# Patient Record
Sex: Female | Born: 1945 | Race: Black or African American | Hispanic: No | Smoking: Former smoker
Health system: Southern US, Community
[De-identification: ages and names within clinical notes are randomized; demographics above are authoritative.]

## PROBLEM LIST (undated history)

## (undated) ENCOUNTER — Emergency Department (HOSPITAL_COMMUNITY): Discharge status: Against Medical Advice | Payer: Medicare Other

## (undated) DIAGNOSIS — Z889 Allergy status to unspecified drugs, medicaments and biological substances status: Secondary | ICD-10-CM

## (undated) DIAGNOSIS — I10 Essential (primary) hypertension: Secondary | ICD-10-CM

## (undated) DIAGNOSIS — F419 Anxiety disorder, unspecified: Secondary | ICD-10-CM

## (undated) DIAGNOSIS — J189 Pneumonia, unspecified organism: Secondary | ICD-10-CM

## (undated) DIAGNOSIS — E785 Hyperlipidemia, unspecified: Secondary | ICD-10-CM

## (undated) DIAGNOSIS — M1990 Unspecified osteoarthritis, unspecified site: Secondary | ICD-10-CM

## (undated) DIAGNOSIS — E119 Type 2 diabetes mellitus without complications: Secondary | ICD-10-CM

## (undated) DIAGNOSIS — Z8719 Personal history of other diseases of the digestive system: Secondary | ICD-10-CM

## (undated) DIAGNOSIS — D171 Benign lipomatous neoplasm of skin and subcutaneous tissue of trunk: Secondary | ICD-10-CM

## (undated) DIAGNOSIS — G4730 Sleep apnea, unspecified: Secondary | ICD-10-CM

## (undated) HISTORY — PX: BACK SURGERY: SHX140

## (undated) HISTORY — PX: EXPLORATORY LAPAROTOMY: SUR591

## (undated) HISTORY — PX: ELBOW SURGERY: SHX618

## (undated) HISTORY — PX: FOOT SURGERY: SHX648

## (undated) HISTORY — PX: WRIST SURGERY: SHX841

## (undated) HISTORY — PX: ABDOMINAL HYSTERECTOMY: SHX81

## (undated) HISTORY — PX: CATARACT EXTRACTION, BILATERAL: SHX1313

## (undated) HISTORY — PX: CHOLECYSTECTOMY: SHX55

## (undated) HISTORY — PX: TONSILLECTOMY: SUR1361

## (undated) HISTORY — PX: KNEE ARTHROSCOPY: SHX127

## (undated) HISTORY — PX: CARPAL TUNNEL RELEASE: SHX101

---

## 1998-02-09 ENCOUNTER — Ambulatory Visit: Admission: RE | Admit: 1998-02-09 | Discharge: 1998-02-09 | Payer: Self-pay | Admitting: Dentistry

## 1999-02-19 ENCOUNTER — Other Ambulatory Visit: Admission: RE | Admit: 1999-02-19 | Discharge: 1999-02-19 | Payer: Self-pay | Admitting: Obstetrics

## 1999-03-25 ENCOUNTER — Encounter: Payer: Self-pay | Admitting: Cardiology

## 1999-03-25 ENCOUNTER — Ambulatory Visit (HOSPITAL_COMMUNITY): Admission: RE | Admit: 1999-03-25 | Discharge: 1999-03-25 | Payer: Self-pay | Admitting: Cardiology

## 1999-04-16 ENCOUNTER — Encounter: Payer: Self-pay | Admitting: Obstetrics

## 1999-04-16 ENCOUNTER — Encounter: Admission: RE | Admit: 1999-04-16 | Discharge: 1999-04-16 | Payer: Self-pay | Admitting: Obstetrics

## 1999-07-25 ENCOUNTER — Ambulatory Visit (HOSPITAL_COMMUNITY): Admission: RE | Admit: 1999-07-25 | Discharge: 1999-07-25 | Payer: Self-pay | Admitting: *Deleted

## 1999-09-27 ENCOUNTER — Encounter: Admission: RE | Admit: 1999-09-27 | Discharge: 1999-09-27 | Payer: Self-pay | Admitting: *Deleted

## 1999-09-27 ENCOUNTER — Encounter: Payer: Self-pay | Admitting: *Deleted

## 2000-06-02 ENCOUNTER — Other Ambulatory Visit: Admission: RE | Admit: 2000-06-02 | Discharge: 2000-06-02 | Payer: Self-pay | Admitting: Obstetrics

## 2000-07-15 ENCOUNTER — Encounter: Payer: Self-pay | Admitting: *Deleted

## 2000-07-15 ENCOUNTER — Ambulatory Visit (HOSPITAL_COMMUNITY): Admission: RE | Admit: 2000-07-15 | Discharge: 2000-07-15 | Payer: Self-pay | Admitting: *Deleted

## 2000-07-17 ENCOUNTER — Ambulatory Visit (HOSPITAL_COMMUNITY): Admission: RE | Admit: 2000-07-17 | Discharge: 2000-07-17 | Payer: Self-pay | Admitting: *Deleted

## 2000-10-13 ENCOUNTER — Ambulatory Visit (HOSPITAL_COMMUNITY): Admission: RE | Admit: 2000-10-13 | Discharge: 2000-10-13 | Payer: Self-pay | Admitting: *Deleted

## 2001-12-20 ENCOUNTER — Encounter: Admission: RE | Admit: 2001-12-20 | Discharge: 2002-01-25 | Payer: Self-pay

## 2003-12-20 ENCOUNTER — Ambulatory Visit (HOSPITAL_COMMUNITY): Admission: RE | Admit: 2003-12-20 | Discharge: 2003-12-20 | Payer: Self-pay

## 2004-01-03 ENCOUNTER — Ambulatory Visit: Payer: Self-pay | Admitting: Internal Medicine

## 2004-01-03 ENCOUNTER — Inpatient Hospital Stay (HOSPITAL_COMMUNITY): Admission: EM | Admit: 2004-01-03 | Discharge: 2004-01-06 | Payer: Self-pay | Admitting: Emergency Medicine

## 2004-01-04 ENCOUNTER — Encounter (INDEPENDENT_AMBULATORY_CARE_PROVIDER_SITE_OTHER): Payer: Self-pay | Admitting: *Deleted

## 2004-01-26 ENCOUNTER — Encounter: Admission: RE | Admit: 2004-01-26 | Discharge: 2004-01-26 | Payer: Self-pay | Admitting: Gastroenterology

## 2004-01-30 ENCOUNTER — Ambulatory Visit (HOSPITAL_COMMUNITY): Admission: RE | Admit: 2004-01-30 | Discharge: 2004-01-30 | Payer: Self-pay | Admitting: Gastroenterology

## 2004-02-23 ENCOUNTER — Encounter: Admission: RE | Admit: 2004-02-23 | Discharge: 2004-02-23 | Payer: Self-pay | Admitting: Family Medicine

## 2004-03-18 ENCOUNTER — Ambulatory Visit: Payer: Self-pay | Admitting: Pulmonary Disease

## 2004-03-26 ENCOUNTER — Ambulatory Visit: Payer: Self-pay | Admitting: Pulmonary Disease

## 2004-03-26 ENCOUNTER — Ambulatory Visit: Admission: RE | Admit: 2004-03-26 | Discharge: 2004-03-26 | Payer: Self-pay | Admitting: Pulmonary Disease

## 2004-04-01 ENCOUNTER — Encounter: Admission: RE | Admit: 2004-04-01 | Discharge: 2004-04-01 | Payer: Self-pay | Admitting: Obstetrics

## 2004-04-03 ENCOUNTER — Ambulatory Visit: Payer: Self-pay | Admitting: Pulmonary Disease

## 2004-04-29 ENCOUNTER — Ambulatory Visit: Payer: Self-pay | Admitting: Pulmonary Disease

## 2004-07-24 ENCOUNTER — Ambulatory Visit: Payer: Self-pay | Admitting: Pulmonary Disease

## 2005-07-11 ENCOUNTER — Emergency Department (HOSPITAL_COMMUNITY): Admission: EM | Admit: 2005-07-11 | Discharge: 2005-07-11 | Payer: Self-pay | Admitting: Emergency Medicine

## 2006-02-12 ENCOUNTER — Encounter: Admission: RE | Admit: 2006-02-12 | Discharge: 2006-02-12 | Payer: Self-pay | Admitting: Internal Medicine

## 2006-02-12 ENCOUNTER — Encounter: Admission: RE | Admit: 2006-02-12 | Discharge: 2006-05-13 | Payer: Self-pay | Admitting: Internal Medicine

## 2006-02-17 ENCOUNTER — Encounter: Admission: RE | Admit: 2006-02-17 | Discharge: 2006-02-17 | Payer: Self-pay | Admitting: Internal Medicine

## 2006-02-19 ENCOUNTER — Encounter: Admission: RE | Admit: 2006-02-19 | Discharge: 2006-02-19 | Payer: Self-pay | Admitting: Internal Medicine

## 2006-03-03 ENCOUNTER — Encounter: Admission: RE | Admit: 2006-03-03 | Discharge: 2006-03-03 | Payer: Self-pay | Admitting: Internal Medicine

## 2007-04-19 ENCOUNTER — Encounter: Admission: RE | Admit: 2007-04-19 | Discharge: 2007-04-19 | Payer: Self-pay | Admitting: Internal Medicine

## 2007-09-10 ENCOUNTER — Ambulatory Visit (HOSPITAL_COMMUNITY): Admission: RE | Admit: 2007-09-10 | Discharge: 2007-09-10 | Payer: Self-pay | Admitting: Internal Medicine

## 2007-10-12 ENCOUNTER — Encounter: Admission: RE | Admit: 2007-10-12 | Discharge: 2007-10-12 | Payer: Self-pay | Admitting: Internal Medicine

## 2007-10-25 ENCOUNTER — Ambulatory Visit: Payer: Self-pay | Admitting: Internal Medicine

## 2007-10-25 DIAGNOSIS — R1013 Epigastric pain: Secondary | ICD-10-CM | POA: Insufficient documentation

## 2007-10-25 DIAGNOSIS — K449 Diaphragmatic hernia without obstruction or gangrene: Secondary | ICD-10-CM | POA: Insufficient documentation

## 2007-10-25 DIAGNOSIS — E669 Obesity, unspecified: Secondary | ICD-10-CM | POA: Insufficient documentation

## 2007-11-04 ENCOUNTER — Ambulatory Visit: Payer: Self-pay | Admitting: Internal Medicine

## 2007-11-17 ENCOUNTER — Telehealth: Payer: Self-pay | Admitting: Internal Medicine

## 2007-11-25 ENCOUNTER — Encounter: Payer: Self-pay | Admitting: Internal Medicine

## 2008-06-01 ENCOUNTER — Encounter: Admission: RE | Admit: 2008-06-01 | Discharge: 2008-06-01 | Payer: Self-pay | Admitting: Internal Medicine

## 2008-08-14 ENCOUNTER — Encounter: Admission: RE | Admit: 2008-08-14 | Discharge: 2008-08-14 | Payer: Self-pay | Admitting: Gastroenterology

## 2009-05-15 ENCOUNTER — Encounter: Admission: RE | Admit: 2009-05-15 | Discharge: 2009-05-15 | Payer: Self-pay | Admitting: Internal Medicine

## 2009-05-17 ENCOUNTER — Encounter (INDEPENDENT_AMBULATORY_CARE_PROVIDER_SITE_OTHER): Payer: Self-pay | Admitting: Internal Medicine

## 2009-05-17 ENCOUNTER — Observation Stay (HOSPITAL_COMMUNITY): Admission: EM | Admit: 2009-05-17 | Discharge: 2009-05-18 | Payer: Self-pay | Admitting: Emergency Medicine

## 2009-06-04 ENCOUNTER — Encounter: Admission: RE | Admit: 2009-06-04 | Discharge: 2009-06-04 | Payer: Self-pay | Admitting: Internal Medicine

## 2009-07-04 ENCOUNTER — Encounter (HOSPITAL_COMMUNITY): Admission: RE | Admit: 2009-07-04 | Discharge: 2009-09-12 | Payer: Self-pay | Admitting: Cardiology

## 2010-05-04 ENCOUNTER — Encounter: Payer: Self-pay | Admitting: Family Medicine

## 2010-07-03 LAB — TROPONIN I: Troponin I: 0.01 ng/mL (ref 0.00–0.06)

## 2010-07-03 LAB — COMPREHENSIVE METABOLIC PANEL
ALT: 11 U/L (ref 0–35)
AST: 18 U/L (ref 0–37)
Albumin: 3.7 g/dL (ref 3.5–5.2)
Alkaline Phosphatase: 47 U/L (ref 39–117)
BUN: 20 mg/dL (ref 6–23)
CO2: 26 mEq/L (ref 19–32)
Calcium: 9.3 mg/dL (ref 8.4–10.5)
Chloride: 102 mEq/L (ref 96–112)
Creatinine, Ser: 1.3 mg/dL — ABNORMAL HIGH (ref 0.4–1.2)
GFR calc Af Amer: 50 mL/min — ABNORMAL LOW (ref >60)
GFR calc non Af Amer: 42 mL/min — ABNORMAL LOW (ref >60)
Glucose, Bld: 229 mg/dL — ABNORMAL HIGH (ref 70–99)
Potassium: 4.1 mEq/L (ref 3.5–5.1)
Sodium: 137 mEq/L (ref 135–145)
Total Bilirubin: 0.9 mg/dL (ref 0.3–1.2)
Total Protein: 7.4 g/dL (ref 6.0–8.3)

## 2010-07-03 LAB — CK TOTAL AND CKMB (NOT AT ARMC)
CK, MB: 0.9 ng/mL (ref 0.3–4.0)
Relative Index: INVALID (ref 0.0–2.5)
Total CK: 69 U/L (ref 7–177)

## 2010-07-03 LAB — BASIC METABOLIC PANEL
BUN: 18 mg/dL (ref 6–23)
BUN: 23 mg/dL (ref 6–23)
CO2: 26 mEq/L (ref 19–32)
CO2: 28 mEq/L (ref 19–32)
Calcium: 9.4 mg/dL (ref 8.4–10.5)
Calcium: 9.5 mg/dL (ref 8.4–10.5)
Chloride: 103 mEq/L (ref 96–112)
Chloride: 99 mEq/L (ref 96–112)
Creatinine, Ser: 1.16 mg/dL (ref 0.4–1.2)
Creatinine, Ser: 1.4 mg/dL — ABNORMAL HIGH (ref 0.4–1.2)
GFR calc Af Amer: 46 mL/min — ABNORMAL LOW (ref >60)
GFR calc Af Amer: 57 mL/min — ABNORMAL LOW (ref >60)
GFR calc non Af Amer: 38 mL/min — ABNORMAL LOW (ref >60)
GFR calc non Af Amer: 47 mL/min — ABNORMAL LOW (ref >60)
Glucose, Bld: 125 mg/dL — ABNORMAL HIGH (ref 70–99)
Glucose, Bld: 139 mg/dL — ABNORMAL HIGH (ref 70–99)
Potassium: 3.8 mEq/L (ref 3.5–5.1)
Potassium: 3.9 mEq/L (ref 3.5–5.1)
Sodium: 137 mEq/L (ref 135–145)
Sodium: 137 mEq/L (ref 135–145)

## 2010-07-03 LAB — VITAMIN B12: Vitamin B-12: 376 pg/mL (ref 211–911)

## 2010-07-03 LAB — DIFFERENTIAL
Basophils Absolute: 0 10*3/uL (ref 0.0–0.1)
Basophils Relative: 1 % (ref 0–1)
Eosinophils Absolute: 0.1 10*3/uL (ref 0.0–0.7)
Eosinophils Relative: 1 % (ref 0–5)
Lymphocytes Relative: 18 % (ref 12–46)
Lymphs Abs: 1.8 10*3/uL (ref 0.7–4.0)
Monocytes Absolute: 0.7 10*3/uL (ref 0.1–1.0)
Monocytes Relative: 7 % (ref 3–12)
Neutro Abs: 7.3 10*3/uL (ref 1.7–7.7)
Neutrophils Relative %: 73 % (ref 43–77)

## 2010-07-03 LAB — GLUCOSE, CAPILLARY
Glucose-Capillary: 123 mg/dL — ABNORMAL HIGH (ref 70–99)
Glucose-Capillary: 124 mg/dL — ABNORMAL HIGH (ref 70–99)
Glucose-Capillary: 138 mg/dL — ABNORMAL HIGH (ref 70–99)
Glucose-Capillary: 175 mg/dL — ABNORMAL HIGH (ref 70–99)
Glucose-Capillary: 186 mg/dL — ABNORMAL HIGH (ref 70–99)
Glucose-Capillary: 274 mg/dL — ABNORMAL HIGH (ref 70–99)

## 2010-07-03 LAB — CBC
HCT: 31.8 % — ABNORMAL LOW (ref 36.0–46.0)
HCT: 34.8 % — ABNORMAL LOW (ref 36.0–46.0)
HCT: 35.2 % — ABNORMAL LOW (ref 36.0–46.0)
Hemoglobin: 10.7 g/dL — ABNORMAL LOW (ref 12.0–15.0)
Hemoglobin: 11.9 g/dL — ABNORMAL LOW (ref 12.0–15.0)
Hemoglobin: 11.9 g/dL — ABNORMAL LOW (ref 12.0–15.0)
MCHC: 33.7 g/dL (ref 30.0–36.0)
MCHC: 33.8 g/dL (ref 30.0–36.0)
MCHC: 34.1 g/dL (ref 30.0–36.0)
MCV: 85.3 fL (ref 78.0–100.0)
MCV: 85.4 fL (ref 78.0–100.0)
MCV: 87.1 fL (ref 78.0–100.0)
Platelets: 338 10*3/uL (ref 150–400)
Platelets: 348 10*3/uL (ref 150–400)
Platelets: 403 10*3/uL — ABNORMAL HIGH (ref 150–400)
RBC: 3.66 MIL/uL — ABNORMAL LOW (ref 3.87–5.11)
RBC: 4.07 MIL/uL (ref 3.87–5.11)
RBC: 4.12 MIL/uL (ref 3.87–5.11)
RDW: 13.9 % (ref 11.5–15.5)
RDW: 14 % (ref 11.5–15.5)
RDW: 14.2 % (ref 11.5–15.5)
WBC: 10 10*3/uL (ref 4.0–10.5)
WBC: 8.4 10*3/uL (ref 4.0–10.5)

## 2010-07-03 LAB — CARDIAC PANEL(CRET KIN+CKTOT+MB+TROPI)
CK, MB: 1.3 ng/mL (ref 0.3–4.0)
CK, MB: 2.4 ng/mL (ref 0.3–4.0)
Relative Index: 1.4 (ref 0.0–2.5)
Relative Index: INVALID (ref 0.0–2.5)
Total CK: 168 U/L (ref 7–177)
Total CK: 78 U/L (ref 7–177)
Troponin I: 0.01 ng/mL (ref 0.00–0.06)
Troponin I: 0.03 ng/mL (ref 0.00–0.06)

## 2010-07-03 LAB — IRON AND TIBC
Iron: 58 ug/dL (ref 42–135)
Saturation Ratios: 17 % — ABNORMAL LOW (ref 20–55)
TIBC: 346 ug/dL (ref 250–470)
UIBC: 288 ug/dL

## 2010-07-03 LAB — POCT CARDIAC MARKERS
CKMB, poc: <1 ng/mL — ABNORMAL LOW (ref 1.0–8.0)
Myoglobin, poc: 66.1 ng/mL (ref 12–200)
Troponin i, poc: <0.05 ng/mL (ref 0.00–0.09)

## 2010-07-03 LAB — LIPID PANEL
Cholesterol: 183 mg/dL (ref 0–200)
HDL: 41 mg/dL (ref >39)
LDL Cholesterol: 116 mg/dL — ABNORMAL HIGH (ref 0–99)
Total CHOL/HDL Ratio: 4.5 RATIO
Triglycerides: 130 mg/dL (ref <150)
VLDL: 26 mg/dL (ref 0–40)

## 2010-07-03 LAB — RETICULOCYTES
RBC.: 4.01 MIL/uL (ref 3.87–5.11)
Retic Count, Absolute: 32.1 10*3/uL (ref 19.0–186.0)
Retic Ct Pct: 0.8 % (ref 0.4–3.1)

## 2010-07-03 LAB — TSH: TSH: 1.589 u[IU]/mL (ref 0.350–4.500)

## 2010-07-03 LAB — FERRITIN: Ferritin: 322 ng/mL — ABNORMAL HIGH (ref 10–291)

## 2010-07-03 LAB — D-DIMER, QUANTITATIVE: D-Dimer, Quant: 0.98 ug/mL-FEU — ABNORMAL HIGH (ref 0.00–0.48)

## 2010-07-03 LAB — FOLATE: Folate: 8.6 ng/mL

## 2010-07-03 LAB — HEMOGLOBIN A1C
Hgb A1c MFr Bld: 6.9 % — ABNORMAL HIGH (ref 4.6–6.1)
Mean Plasma Glucose: 151 mg/dL

## 2010-07-03 LAB — BRAIN NATRIURETIC PEPTIDE: Pro B Natriuretic peptide (BNP): <30 pg/mL (ref 0.0–100.0)

## 2010-08-30 NOTE — Op Note (Signed)
[name], [name]               ACCOUNT NO.:  [id number]   MEDICAL RECORD NO.:  [id number]          PATIENT TYPE:  OUT   LOCATION:  CARD                         FACILITY:  [facility]   PHYSICIAN:  Oley Balm. Sung Amabile, M.D. [facility] OF BIRTH:  1945-08-29   DATE OF PROCEDURE:  03/26/2004  DATE OF DISCHARGE:  03/26/2004                                 OPERATIVE REPORT   PROCEDURE:  Cardiopulmonary stress test.   INDICATION:  Exertional dyspnea.   DESCRIPTION OF PROCEDURE:  Cardiopulmonary stress testing was performed on a  graded treadmill.  Testing was stopped due to dyspnea and fatigue.  Effort  was submaximal.  At peak exercise, oxygen uptake was 1.63 Lpm or 90% of  predicted maximum, indicating normal cardiovascular performance.  However,  when corrected for body weight, her maximum oxygen uptake is 45% of  predicted maximum, suggesting moderate to severe impairment.  At peak  exercise, heart rate was 100 or 61% of predicted maximum, indicating that  cardiovascular reserve remained.  Oxygen pulse was normal, suggesting normal  left ventricular function.  Blood pressure response was normal.  EKG  tracings revealed abundant artifact with no discernable abnormality.   At peak exercise, minute ventilation was 43 Lpm or 45% of predicted maximum,  indicating ventilatory reserve remained.  Gas exchange parameters revealed  no abnormalities.  Baseline spirometry revealed no obstruction, borderline  restriction, and normal diffusion capacity.  Postexercise spirometry  revealed no exercise-induced bronchospasm.   SUMMARY:  Normal exercise tolerance by VO2 max.  Moderate to severe  impairment when corrected for her body weight of 290 pounds.  Her limitation  was fatigue, and she did not meet a cardiovascular or ventilatory  limitation.  The results of this testing indicate that the major cause of  her exercise intolerance is obesity.      DBS/MEDQ  D:  03/30/2004  T:  03/31/2004  Job:   [id number]   cc:   Cardiopulmonary Dept. [facility]

## 2010-08-30 NOTE — Op Note (Signed)
[name], [name]               ACCOUNT NO.:  [id number]   MEDICAL RECORD NO.:  [id number]          PATIENT TYPE:  INP   LOCATION:  4733                         FACILITY:  [facility]   PHYSICIAN:  Armanda Magic, M.D.     DATE OF BIRTH:  1946-01-11   DATE OF PROCEDURE:  01/05/2004  DATE OF DISCHARGE:  01/06/2004                                 OPERATIVE REPORT   REFERRING PHYSICIAN:  Hillery Aldo, M.D.   PROCEDURE:  Left heart catheterization, coronary angiography, left  ventriculography.   OPERATOR:  Armanda Magic, M.D.   INDICATIONS:  Chest pain.   COMPLICATIONS:  None.   INTRAVENOUS ACCESS:  Via right femoral artery 6-French sheath.   HISTORY:  This is a 65 year old black female who was admitted with atypical  chest pain and ruled out for myocardial infarction who continues to have  chest pain after nitro was stopped. She now presents for cardiac  catheterization.   The patient was brought to the cardiac catheterization laboratory in the  fasting nonsedated stated. Informed consent was obtained. The patient was  connected to continuous heart rate and pulse oximetry monitoring and  intermittent blood pressure monitoring. The right groin was prepped and  draped in sterile fashion. One percent Xylocaine was used for local  anesthesia. Using modified Seldinger technique, a 6-French sheath was placed  in the right femoral artery. Under fluoroscopic guidance, a 6-French JL4  catheter was placed in the left coronary artery. Multiple cine films were  taken in 30 degree RAO and 40 degree LAO views. This catheter was then  exchanged out over a guide wire for a 6-French JR4 catheter which was placed  under fluoroscopic guidance in the right coronary artery. Multiple cine  films were taken in 30 degree RAO and 40 degree LAO views. This catheter was  then exchanged out over a guide wire for a 6-French angled pigtailed  catheter which was placed under fluoroscopic guidance in the left  ventricular cavity. Left ventriculography was performed in 30 degree RAO  views with a total of 30 cc of contrast at 15 cc per second. The catheter  was then pulled back across the aortic valve with no significant gradient  noted. At the end of the procedure, all catheters and sheaths were removed.  Manual compression was performed until adequate hemostasis was obtained. The  patient was transferred back to the room in stable condition.   RESULTS:  1.  Left main coronary artery is widely patent and bifurcates into the left      anterior descending artery and left circumflex artery. Left anterior      descending artery is widely patent throughout its course to the apex and      gives rise to a high diagonal 1 which is widely patent and a second      diagonal which is widely patent and bifurcates distally into daughter      branches. Left circumflex is widely patent throughout its course and      gives rise to 1 large obtuse marginal distally which bifurcates into 2  daughter branches, both of which are widely patent.  2.  The right coronary artery is widely patent throughout its course and      bifurcates distally in a posterior descending artery and posterior      lateral artery.  3.  Left ventriculography shows normal LV function, aortic pressure 135/86      mmHg, left ventricular pressure 143/4 mmHg, LVEDP 23 mmHg.   ASSESSMENT:  1.  No cardiac chest pain.  2.  Normal coronary arteries.  3.  Normal left ventricular function.  4.  Mildly increased LVEDP questionable secondary to diastolic dysfunction.   PLAN:  Further workup per Dr. Amil Amen and primary service. IV fluids for 6  hours. Bedrest for 6 hours.      TT/MEDQ  D:  01/05/2004  T:  01/06/2004  Job:  [id number]   cc:   Meade Maw, M.D.  [address]  [place]  [place] [address]  Fax: [phone]

## 2010-08-30 NOTE — Procedures (Signed)
. [facility]  Patient:    [name], [name]                      MRN: [id number] Proc. Date: 07/25/99 Adm. Date:  [id number] Attending:  Sharyn Dross CC:         Kathreen Cosier, M.D., [address]                           Procedure Report  REFERRING PHYSICIAN:  Kathreen Cosier, M.D.  PREOPERATIVE DIAGNOSIS:  Blood in stools.  POSTOPERATIVE DIAGNOSIS:  Normal colonoscopic examination to the cecum.  PROCEDURE:  Colonoscopy.  ENDOSCOPIST:  Dortha Kern, Montez Hageman., M.D.  MEDICATIONS:  Demerol 75 mg IV, Versed 5 mg IV over 10 minute period of time.  INSTRUMENTS:  Olympus video pancolonoscope.  INDICATIONS:  This pleasant 65 year old female was referred for an evaluation because of blood in stools.  She has had no major discomforts, except some mild  infraumbilical pains and discomforts that was noted.  She was evaluated by OB/GYN physician and found to have heme positive stools.  There was no change in the character or color of her stools that was initially noted by the patient at this time.  OBJECTIVE FINDINGS:  She is a pleasant female in no distress.  Her vital signs re stable.  Her HEENT examination is anicteric.  NECK was supple.  LUNGS are clear. HEART had a regular rate and rhythm without heaves, thrills, murmurs or gallops. The ABDOMEN was soft.  No tenderness in the supraumbilical region, but mild infraumbilical tenderness that was noted.  There was no rebound or referred tenderness as noted.  Digital rectal examination appeared to be normal at this time.  The EXTREMITIES were within normal limits.  PLAN:  Will proceed with the colonoscopic examination to evaluate the etiology f the blood in stools and depending upon the results, will determine the course of therapy.  INFORMED CONSENT:  Patient was advised of the procedure, indications, and risks  involved.  Patient has agreed to have the procedure  performed.  PREOPERATIVE PREPARATION:  Patient was brought in the endoscopy unit and an IV or IV sedating medication was started.  A monitor was placed on the patient to monitor the patients vital signs and oxygen saturation.  Nasal oxygen at 2 L/min was used and after adequate sedation was performed, the procedure was begun.  BOWEL PREP:   The patient was given GoLYTELY and Reglan as a bowel prep.  The patient tolerated the prep well without any complications.  The quality of the rep was excellent.  PROCEDURE NOTE:  The instrument was advanced, patient lying in left lateral position, approximately 85 cm proximal colon to the cecal region.  This was confirmed by palpation, as well as, visualization of the ileocecal valve at this time.  There appeared to be no gross abnormalities, such as, masses, polyps or stricture lesions appreciated.  The vascular pattern appeared to be well within normal limits without any abnormalities noted.  The mucosal pattern showed no evidence of any  granular changes, no diverticular changes that was noted.  There was no evidence of any tortuosities that were noted at this time.  There as no evidence of any internal or external hemorrhoids upon exiting from the area.  The patient tolerated the procedure well without any complications.  TREATMENT: 1. Conservative management at this time. 2. Have patient follow up with  me in the office.  LEVEL OF DIFFICULTY:  Level of difficulty was approximately grade 2/10 difficulties  RECOMMENDATIONS:  No recommendations at this time.  Would use regular colonoscope for any further evaluations. DD:  07/25/99 TD:  07/25/99 Job: [id number]

## 2010-08-30 NOTE — Discharge Summary (Signed)
NAMEALIXANDRIA, [name]               ACCOUNT NO.:  [id number]   MEDICAL RECORD NO.:  [id number]          PATIENT TYPE:  INP   LOCATION:  4733                         FACILITY:  [facility]   PHYSICIAN:  Hillery Aldo, M.D.   DATE OF BIRTH:  1945-11-01   DATE OF ADMISSION:  01/03/2004  DATE OF DISCHARGE:  01/06/2004                                 DISCHARGE SUMMARY   DISCHARGE DIAGNOSES:  1.  Noncardiac chest pain.  2.  Hypertension.  3.  Gastroesophageal reflux disease.   OTHER PAST MEDICAL HISTORY:  1.  Hyperlipidemia.  2.  History of cholecystectomy three to four years ago.  3.  History of hysterectomy.  4.  History of bladder procedure.   DISCHARGE MEDICATIONS:  1.  Hydrochlorothiazide 25 mg daily.  2.  Aspirin 81 mg daily.  3.  Zocor 40 mg daily.  4.  Lexapro 10 mg daily.  5.  Zyrtec 10 mg daily.  6.  Protonix 40 mg daily.   FOLLOW UP APPOINTMENT:  The patient will follow up with [facility].  She  will call then to set up and appointment in one to two weeks.  At that time  her chest pain should be reassessed.   PROCEDURES:  1.  A  2-D echo January 04, 2004 showed EF of 60 to 65%, a vigorous left      ventricular systolic function, no LV regional wall abnormalities.  The      right ventricle was mildly dilated.  The patient had trivial mitral      regurgitation and mild tricuspid regurgitation.  2.  Cardiac catheterization on September 23rd showed widely patent coronary      vessels.   CONSULTATIONS:  Cardiology.   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  The pain  lasted approximately 30 minutes associated with some shortness of breath but  no nausea, vomiting, or diaphoresis.  The patient claims that the pain was  not relieved with nitroglycerin sublingually.  A 2-D echo and stress test  was  done in October 2004 which was within normal limits.   ALLERGIES:  No known drug allergies.   SOCIAL HISTORY:  The patient was a former smoker.  She quit about 20 years  ago.  She does not drink alcohol or use drugs.  She works in the school  system, drives a school bus.  She does have insurance.   FAMILY HISTORY:  Her mother is alive.  She had an MI at age 24.  Her father  died in a motor vehicle accident.  She has 2 brothers who are healthy and  she has no diabetes in the family.   PHYSICAL EXAMINATION:  VITAL SIGNS:  Pulse 63, blood pressure 121/72,  temperature 98.1, respirations 24.  O2 saturation 96% on room air.  GENERAL:  She is in some moderate discomfort.  HEENT:  Eyes:  Pupils equal, round, reactive to light.  Extraocular muscles  intact.  ENT:  Mouth is moist.  NECK:  Supple, no thyromegaly.  LUNGS:  Respirations clear bilaterally.  CARDIOVASCULAR:  Negative murmurs, regular rate and rhythm.  GASTROINTESTINAL:  Soft, nontender, positive bowel sounds.  EXTREMITIES:  No lower extremity edema bilaterally.  Skin is warm and dry.  Muscle strength intact.  NEUROLOGICAL:  Alert and oriented x3.  No focal deficits.   ADMISSION LABORATORY DATA:  Sodium 140, potassium 3.8, chloride 103, bicarb  27, BUN 9, creatinine 0.9, and glucose 107, bilirubin 0.7, alk phos 63, AST  24, ALT 20, total protein 7.4 albumin 4.1 and calcium 9.5.  White count 6.8,  hemoglobin 14.2, platelets 275 with an ANC of 4.6 and MCV of 83.  D-dimer  checked in ED was less than 0.22.  PT was 13.2.  A chest x-ray showed  diminished inspiration, no evidence of CHF, there is some volume loss.  Point of care markers in the ED were negative.   HOSPITAL COURSE:  Problem 1.  CHEST PAIN:  All of the patient's pain was  atypical.  She was placed on nitroglycerin which did have some relief in her  chest pain.  EKG was checked which showed no changes.  We checked serial  enzymes which were negative.  The patient also  had a negative D-dimer as  noted above.  I would think that maybe her pain was related to  gastrointestinal sources such as GERD.  Her pain was relieved somewhat with  GI cocktail in the emergency room.  We started her on a proton-pump  inhibitor which we will continue at home.  We checked a 2-D echo with the  results as noted above.  TSH was also checked which was within normal  limits.  The patient was weaned off of the nitroglycerin; however, her chest  pain returned.  Therefore, we called cardiology who came and saw her and  suspected unstable angina.  Therefore, they decided to do a diagnostic  cardiac catheterization with the results as noted above.  Because the  patient had normal coronary arteries, it was believed that this probably was  not cardiac chest pain.  There was some question of diastolic dysfunction on  the cath report.  On the final day of admission, the patient stated that she  was without any chest pain.  What we will do is send her home with Protonix  and have her follow up with [facility] in about one or two weeks.  She may  need a further GI workup if this chest pain does not resolve; however, this  can be done in the outpatient setting.   Problem 2.  HYPERTENSION:  The patient was not very aware of what she was  taking at home for her blood pressure.  It was actually within fairly good  control when she came in.  We just restarted her hydrochlorothiazide once we  found out what she was taking.  We increased her dose to 25 mg daily and we  will send her home on this as well.   Problem 3.  HYPERLIPIDEMIA:  We checked a FLP.  The patient's LDL is 118,  total was 215, triglycerides were 239.  The patient will be sent out on  Zocor 40 mg daily.  Her lipids will need to be checked again in the  outpatient setting and her dose of Zocor may need to be increased if her LDL does not come down.  Also if her triglycerides remained elevated, then she  may need to  be on  supplemental medicine such as fibrate in order to get her  triglycerides down.  This should also be followed up as an outpatient.   DISCHARGE VITAL SIGNS:  Temperature 97.6, blood pressure 127 to 141 over 72  to 86, heart rate 62, respirations 19.   LABORATORY DATA:  Total cholesterol 250, triglyceride 239, LDL 118, HDL 49,  TSH 1.92.  Cardiac enzymes negative x3.  Sodium 138, potassium 3.8, chloride  103, bicarb 28, glucose 124, BUN 7, creatinine 0.9, calcium 9.2.       KC/MEDQ  D:  01/06/2004  T:  01/07/2004  Job:  [id number]   cc:   Dr. Holly Bodily  [facility]

## 2010-10-02 ENCOUNTER — Other Ambulatory Visit: Payer: Self-pay | Admitting: Internal Medicine

## 2010-10-02 DIAGNOSIS — Z1231 Encounter for screening mammogram for malignant neoplasm of breast: Secondary | ICD-10-CM

## 2010-10-11 ENCOUNTER — Ambulatory Visit
Admission: RE | Admit: 2010-10-11 | Discharge: 2010-10-11 | Disposition: A | Discharge status: Home / Self Care | Payer: BC Managed Care – PPO | Source: Ambulatory Visit | Attending: Internal Medicine | Admitting: Internal Medicine

## 2010-10-11 DIAGNOSIS — Z1231 Encounter for screening mammogram for malignant neoplasm of breast: Secondary | ICD-10-CM

## 2010-10-15 ENCOUNTER — Other Ambulatory Visit: Payer: Self-pay | Admitting: Internal Medicine

## 2010-10-15 DIAGNOSIS — R928 Other abnormal and inconclusive findings on diagnostic imaging of breast: Secondary | ICD-10-CM

## 2010-10-31 ENCOUNTER — Ambulatory Visit
Admission: RE | Admit: 2010-10-31 | Discharge: 2010-10-31 | Disposition: A | Discharge status: Home / Self Care | Payer: BC Managed Care – PPO | Source: Ambulatory Visit | Attending: Internal Medicine | Admitting: Internal Medicine

## 2010-10-31 DIAGNOSIS — R928 Other abnormal and inconclusive findings on diagnostic imaging of breast: Secondary | ICD-10-CM

## 2011-09-01 ENCOUNTER — Telehealth: Payer: Self-pay | Admitting: Internal Medicine

## 2011-09-01 NOTE — Telephone Encounter (Signed)
Received copies from [facility],on 09/01/11. Forwarded 35 pages to Dr.Gessner,for review.

## 2011-10-13 ENCOUNTER — Other Ambulatory Visit: Payer: Self-pay | Admitting: Internal Medicine

## 2011-10-13 DIAGNOSIS — Z1231 Encounter for screening mammogram for malignant neoplasm of breast: Secondary | ICD-10-CM

## 2011-10-15 ENCOUNTER — Ambulatory Visit
Admission: RE | Admit: 2011-10-15 | Discharge: 2011-10-15 | Disposition: A | Discharge status: Home / Self Care | Payer: BC Managed Care – PPO | Source: Ambulatory Visit | Attending: Internal Medicine | Admitting: Internal Medicine

## 2011-10-15 DIAGNOSIS — Z1231 Encounter for screening mammogram for malignant neoplasm of breast: Secondary | ICD-10-CM

## 2012-07-08 ENCOUNTER — Other Ambulatory Visit: Payer: Self-pay | Admitting: Gastroenterology

## 2012-07-08 DIAGNOSIS — R109 Unspecified abdominal pain: Secondary | ICD-10-CM

## 2012-07-08 DIAGNOSIS — I8500 Esophageal varices without bleeding: Secondary | ICD-10-CM

## 2012-07-13 ENCOUNTER — Ambulatory Visit
Admission: RE | Admit: 2012-07-13 | Discharge: 2012-07-13 | Disposition: A | Discharge status: Home / Self Care | Payer: Medicare Other | Source: Ambulatory Visit | Attending: Gastroenterology | Admitting: Gastroenterology

## 2012-07-13 DIAGNOSIS — I8500 Esophageal varices without bleeding: Secondary | ICD-10-CM

## 2012-07-13 DIAGNOSIS — R109 Unspecified abdominal pain: Secondary | ICD-10-CM

## 2012-07-13 MED ORDER — IOHEXOL 300 MG/ML  SOLN
30.0000 mL | Freq: Once | INTRAMUSCULAR | Status: AC | PRN
  Administered 2012-07-13: 30 mL via ORAL

## 2012-07-13 MED ORDER — IOHEXOL 300 MG/ML  SOLN
125.0000 mL | Freq: Once | INTRAMUSCULAR | Status: AC | PRN
  Administered 2012-07-13: 125 mL via INTRAVENOUS

## 2012-07-14 ENCOUNTER — Other Ambulatory Visit: Payer: BC Managed Care – PPO

## 2012-09-14 ENCOUNTER — Other Ambulatory Visit: Payer: Self-pay

## 2012-09-14 DIAGNOSIS — Z1231 Encounter for screening mammogram for malignant neoplasm of breast: Secondary | ICD-10-CM

## 2012-09-26 ENCOUNTER — Emergency Department (HOSPITAL_COMMUNITY): Payer: Medicare Other

## 2012-09-26 ENCOUNTER — Encounter (HOSPITAL_COMMUNITY): Payer: Self-pay | Admitting: Emergency Medicine

## 2012-09-26 ENCOUNTER — Emergency Department (HOSPITAL_COMMUNITY)
Admission: EM | Admit: 2012-09-26 | Discharge: 2012-09-26 | Disposition: A | Discharge status: Home / Self Care | Payer: Medicare Other | Attending: Emergency Medicine | Admitting: Emergency Medicine

## 2012-09-26 DIAGNOSIS — R002 Palpitations: Secondary | ICD-10-CM | POA: Insufficient documentation

## 2012-09-26 DIAGNOSIS — M549 Dorsalgia, unspecified: Secondary | ICD-10-CM | POA: Insufficient documentation

## 2012-09-26 DIAGNOSIS — R109 Unspecified abdominal pain: Secondary | ICD-10-CM | POA: Insufficient documentation

## 2012-09-26 DIAGNOSIS — N949 Unspecified condition associated with female genital organs and menstrual cycle: Secondary | ICD-10-CM | POA: Insufficient documentation

## 2012-09-26 DIAGNOSIS — Z9089 Acquired absence of other organs: Secondary | ICD-10-CM | POA: Insufficient documentation

## 2012-09-26 DIAGNOSIS — E669 Obesity, unspecified: Secondary | ICD-10-CM | POA: Insufficient documentation

## 2012-09-26 DIAGNOSIS — E785 Hyperlipidemia, unspecified: Secondary | ICD-10-CM | POA: Insufficient documentation

## 2012-09-26 DIAGNOSIS — M7062 Trochanteric bursitis, left hip: Secondary | ICD-10-CM

## 2012-09-26 DIAGNOSIS — Z90710 Acquired absence of both cervix and uterus: Secondary | ICD-10-CM | POA: Insufficient documentation

## 2012-09-26 DIAGNOSIS — Z79899 Other long term (current) drug therapy: Secondary | ICD-10-CM | POA: Insufficient documentation

## 2012-09-26 DIAGNOSIS — I10 Essential (primary) hypertension: Secondary | ICD-10-CM | POA: Insufficient documentation

## 2012-09-26 DIAGNOSIS — R609 Edema, unspecified: Secondary | ICD-10-CM | POA: Insufficient documentation

## 2012-09-26 DIAGNOSIS — E119 Type 2 diabetes mellitus without complications: Secondary | ICD-10-CM | POA: Insufficient documentation

## 2012-09-26 DIAGNOSIS — R5381 Other malaise: Secondary | ICD-10-CM | POA: Insufficient documentation

## 2012-09-26 DIAGNOSIS — M25459 Effusion, unspecified hip: Secondary | ICD-10-CM | POA: Insufficient documentation

## 2012-09-26 DIAGNOSIS — M7989 Other specified soft tissue disorders: Secondary | ICD-10-CM | POA: Insufficient documentation

## 2012-09-26 DIAGNOSIS — M76899 Other specified enthesopathies of unspecified lower limb, excluding foot: Secondary | ICD-10-CM | POA: Insufficient documentation

## 2012-09-26 HISTORY — DX: Type 2 diabetes mellitus without complications: E11.9

## 2012-09-26 HISTORY — DX: Essential (primary) hypertension: I10

## 2012-09-26 MED ORDER — HYDROCODONE-ACETAMINOPHEN 5-325 MG PO TABS: 1.0000 | ORAL_TABLET | Freq: Three times a day (TID) | ORAL | Status: DC | PRN

## 2012-09-26 MED ORDER — HYDROMORPHONE HCL PF 1 MG/ML IJ SOLN
1.0000 mg | Freq: Once | INTRAMUSCULAR | Status: AC
  Administered 2012-09-26: 1 mg via INTRAVENOUS
  Filled 2012-09-26: qty 1

## 2012-09-26 NOTE — ED Notes (Signed)
Patient transferred from [facility] with Left Hip pain onset 08:00pm on Saturday 09/25/2012. Patient denies injury. Rates pain a 0/10 at this time. No pain unless she has movement. Pain level increases to a 10 with movement.

## 2012-09-26 NOTE — ED Notes (Signed)
Patient discharged with instructions using the teach back method. Patient verbalizes an understanding. 

## 2012-09-26 NOTE — ED Provider Notes (Signed)
History     CSN: [id number]  Arrival date & time 09/26/12  1032   First MD Initiated Contact with Patient 09/26/12 1033      Chief Complaint  Patient presents with  . Hip Pain    (Consider location/radiation/quality/duration/timing/severity/associated sxs/prior treatment) HPI Comments: [name] is a 67 year old obese African American female with PMH of DM2 and HTN transferred to [facility] from [facility] ED for severe L groin/hip pain with movement x2 days.  She explains that around 8pm yesterday, she was getting out of her car to pump gas and she had sudden onset of Left mid groin pain as soon as she turned to get out of the car.  She then helped herself up to stand and pump gas in excruciating pain and when she sat back down the pain improved.  She continued to have pain with any movement overnight and decided to come to the emergency room.  She has no pain at rest, but with any range of motion of LLE, she has sudden sharp pain in L groin, especially with inversion and eversion of her left foot.  In [place] ED she was evaluated, L hip xray was negative for any fracture, dislocation, or other acute abnormality; only showing L pelvic phleboliths.  Pelvic xrays showed fixation hardware across lumbosacral spine but also negative for any fracture or dislocation.  L knee xray showed tricompartmental degenerative changes, mostly medially.  CBC was abnormal for 74.2 absolute neutrophils and glucose on BMET was 124.  Finally, CT of hip was also negative.  She remained in pain and unable to walk, ED physician there discussed with [facility], Dr. Rennis Chris, who recommended transfer to our facility for possible joint aspiration if needed.  She denies any similar prior episodes, denies any trauma to the area, fever, chills, chest pain, shortness of breath, abdominal pain, dysuria, N/V/D, constipation, headaches, or any other urinary complaints at this time.  She does endorses chronic back pain, pulling  sensation in her back with lifting of LLE, intermittent L knee swelling and b/l ankle swelling occasionally.    Patient is a 67 y.o. female presenting with hip pain. The history is provided by the patient and a relative. No language interpreter was used.  Hip Pain This is a new problem. The current episode started yesterday. The problem occurs intermittently. The problem has been unchanged. Associated symptoms include joint swelling and weakness. Pertinent negatives include no abdominal pain, chest pain, chills, congestion, coughing, diaphoresis, fever, headaches, nausea, neck pain, numbness, rash, urinary symptoms or vomiting. The symptoms are aggravated by bending, walking, twisting and exertion (eversion of left foot). Treatments tried: narcotics in emergency room. The treatment provided no relief.    Past Medical History  Diagnosis Date  . Hypertension   . Diabetes mellitus without complication    Past Surgical History  Procedure Laterality Date  . Cholecystectomy    . Tonsillectomy    . Abdominal hysterectomy      History reviewed. No pertinent family history.  History  Substance Use Topics  . Smoking status: Not on file  . Smokeless tobacco: Not on file  . Alcohol Use: Not on file    OB History   Grav Para Term Preterm Abortions TAB SAB Ect Mult Living                  Review of Systems  Constitutional: Negative for fever, chills and diaphoresis.  HENT: Negative.  Negative for congestion and neck pain.   Eyes:  Negative.  Negative for visual disturbance.  Respiratory: Negative.  Negative for cough and shortness of breath.   Cardiovascular: Positive for palpitations and leg swelling. Negative for chest pain.  Gastrointestinal: Negative.  Negative for nausea, vomiting, abdominal pain, diarrhea, constipation, blood in stool and abdominal distention.  Endocrine: Negative.   Genitourinary: Positive for vaginal pain (left knee swelling). Negative for hematuria and difficulty  urinating.  Musculoskeletal: Positive for back pain, joint swelling and gait problem.  Skin: Negative.  Negative for rash.  Allergic/Immunologic: Negative.   Neurological: Positive for weakness. Negative for dizziness, syncope, numbness and headaches.  Hematological: Negative.   Psychiatric/Behavioral: Negative.     Allergies  Review of patient's allergies indicates no known allergies.  Home Medications   Current Outpatient Rx  Name  Route  Sig  Dispense  Refill  . DiphenhydrAMINE HCl (ALLERGY MEDICATION PO)   Oral   Take 1 tablet by mouth daily.         Marland Kitchen glimepiride (AMARYL) 2 MG tablet   Oral   Take 2 mg by mouth daily before breakfast.         . Pitavastatin Calcium (LIVALO) 2 MG TABS   Oral   Take 2 mg by mouth daily.         . traMADol (ULTRAM) 50 MG tablet   Oral   Take 50-100 mg by mouth every 6 (six) hours as needed for pain.         . Vitamin D, Ergocalciferol, (DRISDOL) 50000 UNITS CAPS   Oral   Take 50,000 Units by mouth 2 (two) times a week. Tuesday and Friday.           BP 119/70  Pulse 56  Temp(Src) 97.7 F (36.5 C) (Oral)  Resp 16  SpO2 100%  Physical Exam  Constitutional: She is oriented to person, place, and time. She appears well-developed. No distress.  obese  HENT:  Head: Normocephalic and atraumatic.  Eyes: Conjunctivae and EOM are normal. Pupils are equal, round, and reactive to light.  Neck: Normal range of motion. Neck supple.  Cardiovascular: Normal rate, regular rhythm, normal heart sounds and intact distal pulses.   Pulmonary/Chest: Effort normal and breath sounds normal. No respiratory distress. She has no wheezes.  Abdominal: Soft. Bowel sounds are normal. She exhibits no distension. There is no tenderness.  obese  Musculoskeletal: She exhibits edema. She exhibits no tenderness.  Limited ROM of LLE due to pain from pulling sensation in back upon left leg raise and extreme pain in left groin area with eversion and  inversion of left foot.  No difficulty or pain with dorsi and plantar flexion.  Chronic back pain  Minimal b/l lower extremity pitting edema.No visible erythema, warmth, or tenderness to palpation of b/l extremities, hip and pelvic region.    Neurological: She is alert and oriented to person, place, and time.  Skin: Skin is warm and dry. No rash noted. She is not diaphoretic. No erythema.  Psychiatric: She has a normal mood and affect. Her behavior is normal. Judgment and thought content normal.    ED Course  Procedures (including critical care time)  Labs Reviewed - No data to display Mr Hip Left Wo Contrast  09/26/2012   *RADIOLOGY REPORT*  Clinical Data: Severe left hip pain.  MRI OF THE LEFT HIP WITHOUT CONTRAST  Technique:  Multiplanar, multisequence MR imaging was performed. No intravenous contrast was administered.  Comparison: None.  Findings: The hips are located.  Bone marrow signal is normal  in the femoral heads bilaterally without fracture or avascular necrosis.  The bony pelvis and visualized femora also demonstrate normal signal.  The patient is status post L4-S1 fusion with artifact from hardware noted.  There is no hip joint effusion.  A small amount of fluid is seen in the trochanteric bursa bilaterally.  All visualized muscles and tendons are intact. Imaged intrapelvic contents demonstrate no focal abnormality. The patient is status post hysterectomy.  IMPRESSION:  1.  Small amount of fluid in the trochanteric bursa compatible with bursitis.  No other finding to explain right hip pain is identified. 2.  Status post L4-S1 fusion.   Original Report Authenticated By: Holley Dexter, M.D.     No diagnosis found.    MDM  [name] is a 67 year old obese female with PMH of DM2, HTN, and Hyperlipidemia presenting with sudden onset of L groin pain with any movement x2 days.  No pain at rest, increased pain with eversion and inversion of L foot.  No signs of infection, no fever,  chills, warmth or tenderness at site.  Negative hip and pelvic xray and hip CT at outside facility.  Discussed with Dr. Rennis Chris, Orthopaedics, on the telephone, recommended MRI left hip for possible insufficiency fracture.  Discussed with Dr. Rennis Chris the results of MRI consistent with trochanteric bursitis, recommends symptomatic treatment at this time.  Walker support and analgesics recommended with follow up advised in office as outpatient in one week.   The results of MRI were discussed with [name] and her family.  She claims to have Tramadol at home which she says controls her pain well, she says Ibuprofen does not help at all.  Will also give prescription for limited supply of Vicodin. She has a walker at home that she will use.  Per up to date, acute therapy involves heat treatments and passive stretching exercises.  Recommend follow up in 1 week with Dr. Dub Mikes office.  Recommend close supervision with family at home to prevent falls.  We discussed that if pain worsens or does not improve, to follow up with PCP or orthopaedic office and if severe and needed to return to emergency room.    -dilaudid 1mg  IV x1 -d/c home with pcp and ortho follow up  Case discussed and patient seen with Dr. Anitra Lauth and agrees with assessment and plan.            Darden Palmer, MD 09/26/12 1440

## 2012-09-26 NOTE — ED Provider Notes (Signed)
I saw and evaluated the patient, reviewed the resident's note and I agree with the findings and plan. Pt transferred from [facility] to r/o septic hip, however pt has no infectious sx and started acutely yesterday when getting out of car which would be unusual.  MRI with bursitis.  Discussed findings with dr. Rennis Chris who feels with neg labs and MRI findings that septic joint unlikely and pt has no pain,swelling or warmth to joint and only pain is with movement of the foot.  Gwyneth Sprout, MD 09/26/12 2104

## 2012-09-26 NOTE — ED Notes (Signed)
Patient transported to MRI 

## 2012-09-26 NOTE — ED Notes (Signed)
Judie Grieve, RN at [facility] called to give report. Dr Rennis Chris accepting pt, Anitra Lauth was given report earlier this morning. About 2000 last night pt began having severe left hip and foot pain, severe pain to the point pt unable to put any weight on leg. Pt CT results suggest infectious process. No evidence of fx or dislocation. Dr Rennis Chris will aspirate hip.   0520 and 0855 2mg  dilaudid 0445 morphine 5mg  0445 zofran 4 mg  [facility]

## 2012-10-14 ENCOUNTER — Encounter: Payer: Self-pay | Admitting: Internal Medicine

## 2012-10-18 ENCOUNTER — Ambulatory Visit
Admission: RE | Admit: 2012-10-18 | Discharge: 2012-10-18 | Disposition: A | Discharge status: Home / Self Care | Payer: Medicare Other | Source: Ambulatory Visit

## 2012-10-18 DIAGNOSIS — Z1231 Encounter for screening mammogram for malignant neoplasm of breast: Secondary | ICD-10-CM

## 2013-07-04 ENCOUNTER — Encounter: Payer: Self-pay | Admitting: Internal Medicine

## 2013-09-13 ENCOUNTER — Other Ambulatory Visit: Payer: Self-pay

## 2013-09-13 DIAGNOSIS — Z1231 Encounter for screening mammogram for malignant neoplasm of breast: Secondary | ICD-10-CM

## 2013-10-19 ENCOUNTER — Ambulatory Visit
Admission: RE | Admit: 2013-10-19 | Discharge: 2013-10-19 | Disposition: A | Discharge status: Home / Self Care | Payer: Medicare Other | Source: Ambulatory Visit

## 2013-10-19 ENCOUNTER — Encounter (INDEPENDENT_AMBULATORY_CARE_PROVIDER_SITE_OTHER): Payer: Self-pay

## 2013-10-19 DIAGNOSIS — Z1231 Encounter for screening mammogram for malignant neoplasm of breast: Secondary | ICD-10-CM

## 2014-05-17 ENCOUNTER — Other Ambulatory Visit: Payer: Self-pay | Admitting: Gastroenterology

## 2014-05-17 DIAGNOSIS — R1310 Dysphagia, unspecified: Secondary | ICD-10-CM

## 2014-05-19 ENCOUNTER — Ambulatory Visit
Admission: RE | Admit: 2014-05-19 | Discharge: 2014-05-19 | Disposition: A | Discharge status: Home / Self Care | Payer: Medicare Other | Source: Ambulatory Visit | Attending: Gastroenterology | Admitting: Gastroenterology

## 2014-05-19 DIAGNOSIS — R1310 Dysphagia, unspecified: Secondary | ICD-10-CM

## 2014-12-12 ENCOUNTER — Other Ambulatory Visit: Payer: Self-pay

## 2014-12-12 DIAGNOSIS — Z1231 Encounter for screening mammogram for malignant neoplasm of breast: Secondary | ICD-10-CM

## 2014-12-13 ENCOUNTER — Ambulatory Visit
Admission: RE | Admit: 2014-12-13 | Discharge: 2014-12-13 | Disposition: A | Discharge status: Home / Self Care | Payer: Medicare Other | Source: Ambulatory Visit

## 2014-12-13 DIAGNOSIS — Z1231 Encounter for screening mammogram for malignant neoplasm of breast: Secondary | ICD-10-CM

## 2015-01-01 ENCOUNTER — Other Ambulatory Visit: Payer: Self-pay | Admitting: Orthopedic Surgery

## 2015-01-31 NOTE — H&P (Signed)
TOTAL KNEE ADMISSION H&P  Patient is being admitted for left total knee arthroplasty.  Subjective:  Chief Complaint:left knee pain.  HPI: [name], 69 y.o. female, has a history of pain and functional disability in the left knee due to arthritis and has failed non-surgical conservative treatments for greater than 12 weeks to includeNSAID's and/or analgesics, corticosteriod injections, viscosupplementation injections, supervised PT with diminished ADL's post treatment, weight reduction as appropriate and activity modification.  Onset of symptoms was gradual, starting 3 years ago with gradually worsening course since that time. The patient noted no past surgery on the left knee(s).  Patient currently rates pain in the left knee(s) at 8 out of 10 with activity. Patient has night pain, pain that interferes with activities of daily living and pain with passive range of motion.  Patient has evidence of subchondral sclerosis, periarticular osteophytes and joint space narrowing by imaging studies. This patient has had osteoarthritis. There is no active infection.  Patient Active Problem List   Diagnosis Date Noted  . OBESITY 10/25/2007  . HIATAL HERNIA 10/25/2007  . ABDOMINAL PAIN-EPIGASTRIC 10/25/2007   Past Medical History  Diagnosis Date  . Hypertension   . Diabetes mellitus without complication     Past Surgical History  Procedure Laterality Date  . Cholecystectomy    . Tonsillectomy    . Abdominal hysterectomy      No prescriptions prior to admission   No Known Allergies  Social History  Substance Use Topics  . Smoking status: Not on file  . Smokeless tobacco: Not on file  . Alcohol Use: Not on file    No family history on file.   Review of Systems  Constitutional: Negative.   HENT: Negative.   Eyes: Negative.   Respiratory: Negative.   Cardiovascular: Negative.   Gastrointestinal: Negative.   Genitourinary: Negative.   Musculoskeletal: Positive for joint pain.   Skin: Negative.   Neurological: Negative.   Endo/Heme/Allergies: Negative.   Psychiatric/Behavioral: Negative.     Objective:  Physical Exam  Constitutional: She is oriented to person, place, and time. She appears well-developed.  HENT:  Head: Normocephalic.  Eyes: EOM are normal.  Neck: Normal range of motion.  Cardiovascular: Normal rate, regular rhythm, normal heart sounds and intact distal pulses.   Respiratory: Effort normal.  GI: Soft.  Genitourinary:  Deferred  Musculoskeletal:  Pain with left knee ROM  Neurological: She is alert and oriented to person, place, and time. She has normal reflexes.  Skin: Skin is warm and dry.  Psychiatric: Her behavior is normal.    Vital signs in last 24 hours:    Labs:   Estimated body mass index is 43.25 kg/(m^2) as calculated from the following:   Height as of 10/25/07: 5\' 8"  (1.727 m).   Weight as of 10/25/07: 128.994 kg (284 lb 6.1 oz).   Imaging Review Plain radiographs demonstrate severe degenerative joint disease of the left knee(s). The overall alignment ismild valgus. The bone quality appears to be good for age and reported activity level.  Assessment/Plan:  End stage arthritis, left knee   The patient history, physical examination, clinical judgment of the provider and imaging studies are consistent with end stage degenerative joint disease of the left knee(s) and total knee arthroplasty is deemed medically necessary. The treatment options including medical management, injection therapy arthroscopy and arthroplasty were discussed at length. The risks and benefits of total knee arthroplasty were presented and reviewed. The risks due to aseptic loosening, infection, stiffness, patella tracking problems,  thromboembolic complications and other imponderables were discussed. The patient acknowledged the explanation, agreed to proceed with the plan and consent was signed. Patient is being admitted for inpatient treatment for  surgery, pain control, PT, OT, prophylactic antibiotics, VTE prophylaxis, progressive ambulation and ADL's and discharge planning. The patient is planning to be discharged home with home health services.  Contraindications and adverse affects of Tranexamic acid discussed in detail. Patient denies any of these at this time and understands the risks and benefits.

## 2015-02-08 ENCOUNTER — Encounter (HOSPITAL_COMMUNITY): Payer: Self-pay

## 2015-02-08 ENCOUNTER — Encounter (HOSPITAL_COMMUNITY)
Admission: RE | Admit: 2015-02-08 | Discharge: 2015-02-08 | Disposition: A | Discharge status: Home / Self Care | Payer: Medicare Other | Source: Ambulatory Visit | Attending: Specialist | Admitting: Specialist

## 2015-02-08 DIAGNOSIS — M179 Osteoarthritis of knee, unspecified: Secondary | ICD-10-CM | POA: Insufficient documentation

## 2015-02-08 DIAGNOSIS — Z01818 Encounter for other preprocedural examination: Secondary | ICD-10-CM | POA: Diagnosis present

## 2015-02-08 HISTORY — DX: Sleep apnea, unspecified: G47.30

## 2015-02-08 HISTORY — DX: Allergy status to unspecified drugs, medicaments and biological substances: Z88.9

## 2015-02-08 HISTORY — DX: Unspecified osteoarthritis, unspecified site: M19.90

## 2015-02-08 LAB — PROTIME-INR
INR: 1.04 (ref 0.00–1.49)
Prothrombin Time: 13.8 seconds (ref 11.6–15.2)

## 2015-02-08 LAB — URINALYSIS, ROUTINE W REFLEX MICROSCOPIC
BILIRUBIN URINE: NEGATIVE
Glucose, UA: NEGATIVE mg/dL
Ketones, ur: NEGATIVE mg/dL
Nitrite: NEGATIVE
PH: 5.5 (ref 5.0–8.0)
Protein, ur: NEGATIVE mg/dL
Specific Gravity, Urine: 1.02 (ref 1.005–1.030)
Urobilinogen, UA: 0.2 mg/dL (ref 0.0–1.0)

## 2015-02-08 LAB — BASIC METABOLIC PANEL
ANION GAP: 8 (ref 5–15)
BUN: 13 mg/dL (ref 6–20)
CHLORIDE: 103 mmol/L (ref 101–111)
CO2: 29 mmol/L (ref 22–32)
Calcium: 9.6 mg/dL (ref 8.9–10.3)
Creatinine, Ser: 0.8 mg/dL (ref 0.44–1.00)
GFR calc non Af Amer: >60 mL/min (ref >60)
Glucose, Bld: 92 mg/dL (ref 65–99)
POTASSIUM: 4.1 mmol/L (ref 3.5–5.1)
SODIUM: 140 mmol/L (ref 135–145)

## 2015-02-08 LAB — SURGICAL PCR SCREEN
MRSA, PCR: NEGATIVE
Staphylococcus aureus: NEGATIVE

## 2015-02-08 LAB — CBC
HEMATOCRIT: 40.2 % (ref 36.0–46.0)
HEMOGLOBIN: 13.7 g/dL (ref 12.0–15.0)
MCH: 29.3 pg (ref 26.0–34.0)
MCHC: 34.1 g/dL (ref 30.0–36.0)
MCV: 86.1 fL (ref 78.0–100.0)
Platelets: 289 10*3/uL (ref 150–400)
RBC: 4.67 MIL/uL (ref 3.87–5.11)
RDW: 14.3 % (ref 11.5–15.5)
WBC: 8.4 10*3/uL (ref 4.0–10.5)

## 2015-02-08 LAB — URINE MICROSCOPIC-ADD ON

## 2015-02-08 LAB — ABO/RH: ABO/RH(D): O POS

## 2015-02-08 LAB — APTT: aPTT: 27 seconds (ref 24–37)

## 2015-02-08 NOTE — Pre-Procedure Instructions (Signed)
EKG done today.

## 2015-02-08 NOTE — Patient Instructions (Signed)
22 [name]  02/08/2015   Your procedure is scheduled on:   -11-4- 2016 Friday  Enter through [facility]  Entrance and follow signs to [facility]. Arrive at    1000    AM .  (Limit 1 person with you).  Call this number if you have problems the morning of surgery: [phone]  Or Presurgical Testing [phone].   For Living Will and/or Health Care Power Attorney Forms: please provide copy for your medical record,may bring AM of surgery(Forms should be already notarized -we do not provide this service).( No information preferred today and given).  .   Do not eat food/ or drink: After Midnight.      Take these medicines the morning of surgery with A SIP OF WATER-   (DO NOT TAKE ANY DIABETIC MEDS AM OF SURGERY) : Tylenol.   Do not wear jewelry, make-up or nail polish.  Do not wear deodorant, lotions, powders, or perfumes.   Do not shave legs and under arms- 48 hours(2 days) prior to first CHG shower.(Shaving face and neck okay.)  Do not bring valuables to the hospital.(Hospital is not responsible for lost valuables).  Contacts, dentures or removable bridgework, body piercing, hair pins may not be worn into surgery.  Leave suitcase in the car. After surgery it may be brought to your room.  For patients admitted to the hospital, checkout time is 11:00 AM the day of discharge.(Restricted visitors-Any Persons displaying flu-like symptoms or illness).    Patients discharged the day of surgery will not be allowed to drive home. Must have responsible person with you x 24 hours once discharged.  Name and phone number of your driver: Etheleen Nicks ,cousin [phone] cell.     Please read over the following fact sheets that you were given:  CHG(Chlorhexidine Gluconate 4% Surgical Soap) use, MRSA Information, Blood Transfusion fact sheet, Incentive Spirometry Instruction.  Remember : Type/Screen "Blue armbands" - may not be removed once applied(would result in being  retested AM of surgery, if removed).         [facility] - Preparing for Surgery Before surgery, you can play an important role.  Because skin is not sterile, your skin needs to be as free of germs as possible.  You can reduce the number of germs on your skin by washing with CHG (chlorahexidine gluconate) soap before surgery.  CHG is an antiseptic cleaner which kills germs and bonds with the skin to continue killing germs even after washing. Please DO NOT use if you have an allergy to CHG or antibacterial soaps.  If your skin becomes reddened/irritated stop using the CHG and inform your nurse when you arrive at Short Stay. Do not shave (including legs and underarms) for at least 48 hours prior to the first CHG shower.  You may shave your face/neck. Please follow these instructions carefully:  1.  Shower with CHG Soap the night before surgery and the  morning of Surgery.  2.  If you choose to wash your hair, wash your hair first as usual with your  normal  shampoo.  3.  After you shampoo, rinse your hair and body thoroughly to remove the  shampoo.                           4.  Use CHG as you would any other liquid soap.  You can apply chg directly  to the skin and wash  Gently with a scrungie or clean washcloth.  5.  Apply the CHG Soap to your body ONLY FROM THE NECK DOWN.   Do not use on face/ open                           Wound or open sores. Avoid contact with eyes, ears mouth and genitals (private parts).                       Wash face,  Genitals (private parts) with your normal soap.             6.  Wash thoroughly, paying special attention to the area where your surgery  will be performed.  7.  Thoroughly rinse your body with warm water from the neck down.  8.  DO NOT shower/wash with your normal soap after using and rinsing off  the CHG Soap.                9.  Pat yourself dry with a clean towel.            10.  Wear clean pajamas.            11.  Place clean  sheets on your bed the night of your first shower and do not  sleep with pets. Day of Surgery : Do not apply any lotions/deodorants the morning of surgery.  Please wear clean clothes to the hospital/surgery center.  FAILURE TO FOLLOW THESE INSTRUCTIONS MAY RESULT IN THE CANCELLATION OF YOUR SURGERY PATIENT SIGNATURE_________________________________  NURSE SIGNATURE__________________________________  ________________________________________________________________________   Adam Phenix  An incentive spirometer is a tool that can help keep your lungs clear and active. This tool measures how well you are filling your lungs with each breath. Taking long deep breaths may help reverse or decrease the chance of developing breathing (pulmonary) problems (especially infection) following:  A long period of time when you are unable to move or be active. BEFORE THE PROCEDURE   If the spirometer includes an indicator to show your best effort, your nurse or respiratory therapist will set it to a desired goal.  If possible, sit up straight or lean slightly forward. Try not to slouch.  Hold the incentive spirometer in an upright position. INSTRUCTIONS FOR USE   Sit on the edge of your bed if possible, or sit up as far as you can in bed or on a chair.  Hold the incentive spirometer in an upright position.  Breathe out normally.  Place the mouthpiece in your mouth and seal your lips tightly around it.  Breathe in slowly and as deeply as possible, raising the piston or the ball toward the top of the column.  Hold your breath for 3-5 seconds or for as long as possible. Allow the piston or ball to fall to the bottom of the column.  Remove the mouthpiece from your mouth and breathe out normally.  Rest for a few seconds and repeat Steps 1 through 7 at least 10 times every 1-2 hours when you are awake. Take your time and take a few normal breaths between deep breaths.  The spirometer may  include an indicator to show your best effort. Use the indicator as a goal to work toward during each repetition.  After each set of 10 deep breaths, practice coughing to be sure your lungs are clear. If you have an incision (the cut made at the time of  surgery), support your incision when coughing by placing a pillow or rolled up towels firmly against it. Once you are able to get out of bed, walk around indoors and cough well. You may stop using the incentive spirometer when instructed by your caregiver.  RISKS AND COMPLICATIONS  Take your time so you do not get dizzy or light-headed.  If you are in pain, you may need to take or ask for pain medication before doing incentive spirometry. It is harder to take a deep breath if you are having pain. AFTER USE  Rest and breathe slowly and easily.  It can be helpful to keep track of a log of your progress. Your caregiver can provide you with a simple table to help with this. If you are using the spirometer at home, follow these instructions: [place] IF:   You are having difficultly using the spirometer.  You have trouble using the spirometer as often as instructed.  Your pain medication is not giving enough relief while using the spirometer.  You develop fever of 100.5 F (38.1 C) or higher. SEEK IMMEDIATE MEDICAL CARE IF:   You cough up bloody sputum that had not been present before.  You develop fever of 102 F (38.9 C) or greater.  You develop worsening pain at or near the incision site. MAKE SURE YOU:   Understand these instructions.  Will watch your condition.  Will get help right away if you are not doing well or get worse. Document Released: 08/11/2006 Document Revised: 06/23/2011 Document Reviewed: 10/12/2006 ExitCare Patient Information 2014 [employer], [place].   ________________________________________________________________________  WHAT IS A BLOOD TRANSFUSION? Blood Transfusion Information  A transfusion  is the replacement of blood or some of its parts. Blood is made up of multiple cells which provide different functions.  Red blood cells carry oxygen and are used for blood loss replacement.  White blood cells fight against infection.  Platelets control bleeding.  Plasma helps clot blood.  Other blood products are available for specialized needs, such as hemophilia or other clotting disorders. BEFORE THE TRANSFUSION  Who gives blood for transfusions?   Healthy volunteers who are fully evaluated to make sure their blood is safe. This is blood bank blood. Transfusion therapy is the safest it has ever been in the practice of medicine. Before blood is taken from a donor, a complete history is taken to make sure that person has no history of diseases nor engages in risky social behavior (examples are intravenous drug use or sexual activity with multiple partners). The donor's travel history is screened to minimize risk of transmitting infections, such as malaria. The donated blood is tested for signs of infectious diseases, such as HIV and hepatitis. The blood is then tested to be sure it is compatible with you in order to minimize the chance of a transfusion reaction. If you or a relative donates blood, this is often done in anticipation of surgery and is not appropriate for emergency situations. It takes many days to process the donated blood. RISKS AND COMPLICATIONS Although transfusion therapy is very safe and saves many lives, the main dangers of transfusion include:   Getting an infectious disease.  Developing a transfusion reaction. This is an allergic reaction to something in the blood you were given. Every precaution is taken to prevent this. The decision to have a blood transfusion has been considered carefully by your caregiver before blood is given. Blood is not given unless the benefits outweigh the risks. AFTER THE TRANSFUSION  Right after receiving a blood transfusion, you will  usually feel much better and more energetic. This is especially true if your red blood cells have gotten low (anemic). The transfusion raises the level of the red blood cells which carry oxygen, and this usually causes an energy increase.  The nurse administering the transfusion will monitor you carefully for complications. HOME CARE INSTRUCTIONS  No special instructions are needed after a transfusion. You may find your energy is better. Speak with your caregiver about any limitations on activity for underlying diseases you may have. SEEK MEDICAL CARE IF:   Your condition is not improving after your transfusion.  You develop redness or irritation at the intravenous (IV) site. SEEK IMMEDIATE MEDICAL CARE IF:  Any of the following symptoms occur over the next 12 hours:  Shaking chills.  You have a temperature by mouth above 102 F (38.9 C), not controlled by medicine.  Chest, back, or muscle pain.  People around you feel you are not acting correctly or are confused.  Shortness of breath or difficulty breathing.  Dizziness and fainting.  You get a rash or develop hives.  You have a decrease in urine output.  Your urine turns a dark color or changes to pink, red, or brown. Any of the following symptoms occur over the next 10 days:  You have a temperature by mouth above 102 F (38.9 C), not controlled by medicine.  Shortness of breath.  Weakness after normal activity.  The white part of the eye turns yellow (jaundice).  You have a decrease in the amount of urine or are urinating less often.  Your urine turns a dark color or changes to pink, red, or brown. Document Released: 03/28/2000 Document Revised: 06/23/2011 Document Reviewed: 11/15/2007 [facility] Patient Information 2014 [place], [place].  _______________________________________________________________________

## 2015-02-15 MED ORDER — DEXTROSE 5 % IV SOLN
3.0000 g | INTRAVENOUS | Status: AC
  Administered 2015-02-16: 3 g via INTRAVENOUS
  Filled 2015-02-15 (×2): qty 3000

## 2015-02-16 ENCOUNTER — Encounter (HOSPITAL_COMMUNITY): Payer: Self-pay

## 2015-02-16 ENCOUNTER — Inpatient Hospital Stay (HOSPITAL_COMMUNITY)
Admission: RE | Admit: 2015-02-16 | Discharge: 2015-02-19 | DRG: 470 | Disposition: A | Discharge status: Home Health | Payer: Medicare Other | Source: Ambulatory Visit | Attending: Specialist | Admitting: Specialist

## 2015-02-16 ENCOUNTER — Encounter (HOSPITAL_COMMUNITY)
Admission: RE | Disposition: A | Discharge status: Home Health | Payer: Self-pay | Source: Ambulatory Visit | Attending: Specialist

## 2015-02-16 ENCOUNTER — Inpatient Hospital Stay (HOSPITAL_COMMUNITY): Payer: Medicare Other | Admitting: Certified Registered Nurse Anesthetist

## 2015-02-16 DIAGNOSIS — E669 Obesity, unspecified: Secondary | ICD-10-CM | POA: Diagnosis present

## 2015-02-16 DIAGNOSIS — I10 Essential (primary) hypertension: Secondary | ICD-10-CM | POA: Diagnosis present

## 2015-02-16 DIAGNOSIS — M1712 Unilateral primary osteoarthritis, left knee: Principal | ICD-10-CM | POA: Diagnosis present

## 2015-02-16 DIAGNOSIS — Z87891 Personal history of nicotine dependence: Secondary | ICD-10-CM | POA: Diagnosis not present

## 2015-02-16 DIAGNOSIS — G4730 Sleep apnea, unspecified: Secondary | ICD-10-CM | POA: Diagnosis present

## 2015-02-16 DIAGNOSIS — Z6841 Body Mass Index (BMI) 40.0 and over, adult: Secondary | ICD-10-CM | POA: Diagnosis not present

## 2015-02-16 DIAGNOSIS — R339 Retention of urine, unspecified: Secondary | ICD-10-CM | POA: Diagnosis not present

## 2015-02-16 DIAGNOSIS — E119 Type 2 diabetes mellitus without complications: Secondary | ICD-10-CM | POA: Diagnosis present

## 2015-02-16 DIAGNOSIS — M25562 Pain in left knee: Secondary | ICD-10-CM | POA: Diagnosis present

## 2015-02-16 DIAGNOSIS — Z01812 Encounter for preprocedural laboratory examination: Secondary | ICD-10-CM | POA: Diagnosis not present

## 2015-02-16 DIAGNOSIS — Z96659 Presence of unspecified artificial knee joint: Secondary | ICD-10-CM

## 2015-02-16 HISTORY — PX: TOTAL KNEE ARTHROPLASTY: SHX125

## 2015-02-16 LAB — TYPE AND SCREEN
ABO/RH(D): O POS
ANTIBODY SCREEN: NEGATIVE

## 2015-02-16 LAB — GLUCOSE, CAPILLARY
Glucose-Capillary: 106 mg/dL — ABNORMAL HIGH (ref 65–99)
Glucose-Capillary: 204 mg/dL — ABNORMAL HIGH (ref 65–99)
Glucose-Capillary: 96 mg/dL (ref 65–99)

## 2015-02-16 SURGERY — ARTHROPLASTY, KNEE, TOTAL
Anesthesia: General | Site: Knee | Laterality: Left

## 2015-02-16 MED ORDER — POLYETHYLENE GLYCOL 3350 17 G PO PACK
17.0000 g | PACK | Freq: Every day | ORAL | Status: DC | PRN
  Administered 2015-02-18: 17 g via ORAL
  Filled 2015-02-16: qty 1

## 2015-02-16 MED ORDER — PROPOFOL 10 MG/ML IV BOLUS
INTRAVENOUS | Status: AC
  Filled 2015-02-16: qty 20

## 2015-02-16 MED ORDER — LOSARTAN POTASSIUM-HCTZ 50-12.5 MG PO TABS: 1.0000 | ORAL_TABLET | Freq: Every morning | ORAL | Status: DC

## 2015-02-16 MED ORDER — LIDOCAINE HCL (CARDIAC) 20 MG/ML IV SOLN
INTRAVENOUS | Status: AC
  Filled 2015-02-16: qty 5

## 2015-02-16 MED ORDER — HYDROMORPHONE HCL 2 MG/ML IJ SOLN
INTRAMUSCULAR | Status: AC
  Filled 2015-02-16: qty 1

## 2015-02-16 MED ORDER — INSULIN ASPART 100 UNIT/ML ~~LOC~~ SOLN
0.0000 [IU] | Freq: Three times a day (TID) | SUBCUTANEOUS | Status: DC
  Administered 2015-02-17: 2 [IU] via SUBCUTANEOUS
  Administered 2015-02-17 – 2015-02-19 (×6): 3 [IU] via SUBCUTANEOUS

## 2015-02-16 MED ORDER — TRANEXAMIC ACID 1000 MG/10ML IV SOLN
1000.0000 mg | INTRAVENOUS | Status: AC
  Administered 2015-02-16: 1000 mg via INTRAVENOUS
  Filled 2015-02-16: qty 10

## 2015-02-16 MED ORDER — PROMETHAZINE HCL 25 MG/ML IJ SOLN: 6.2500 mg | INTRAMUSCULAR | Status: DC | PRN

## 2015-02-16 MED ORDER — POVIDONE-IODINE 7.5 % EX SOLN: Freq: Once | CUTANEOUS | Status: DC

## 2015-02-16 MED ORDER — METOCLOPRAMIDE HCL 5 MG/ML IJ SOLN: 5.0000 mg | Freq: Three times a day (TID) | INTRAMUSCULAR | Status: DC | PRN

## 2015-02-16 MED ORDER — SODIUM CHLORIDE 0.9 % IJ SOLN
INTRAMUSCULAR | Status: DC | PRN
  Administered 2015-02-16: 30 mL

## 2015-02-16 MED ORDER — MENTHOL 3 MG MT LOZG: 1.0000 | LOZENGE | OROMUCOSAL | Status: DC | PRN

## 2015-02-16 MED ORDER — METOCLOPRAMIDE HCL 10 MG PO TABS: 5.0000 mg | ORAL_TABLET | Freq: Three times a day (TID) | ORAL | Status: DC | PRN

## 2015-02-16 MED ORDER — GLYCOPYRROLATE 0.2 MG/ML IJ SOLN
INTRAMUSCULAR | Status: AC
  Filled 2015-02-16: qty 2

## 2015-02-16 MED ORDER — ONDANSETRON HCL 4 MG/2ML IJ SOLN
INTRAMUSCULAR | Status: AC
  Filled 2015-02-16: qty 2

## 2015-02-16 MED ORDER — LIDOCAINE HCL (CARDIAC) 20 MG/ML IV SOLN
INTRAVENOUS | Status: DC | PRN
  Administered 2015-02-16: 100 mg via INTRAVENOUS

## 2015-02-16 MED ORDER — FERROUS SULFATE 325 (65 FE) MG PO TABS
325.0000 mg | ORAL_TABLET | Freq: Three times a day (TID) | ORAL | Status: DC
  Administered 2015-02-17 – 2015-02-19 (×8): 325 mg via ORAL
  Filled 2015-02-16 (×12): qty 1

## 2015-02-16 MED ORDER — DIPHENHYDRAMINE HCL 12.5 MG/5ML PO ELIX: 12.5000 mg | ORAL_SOLUTION | ORAL | Status: DC | PRN

## 2015-02-16 MED ORDER — HYDROMORPHONE HCL 1 MG/ML IJ SOLN
1.0000 mg | INTRAMUSCULAR | Status: DC | PRN
  Administered 2015-02-17: 1 mg via INTRAVENOUS
  Filled 2015-02-16: qty 1

## 2015-02-16 MED ORDER — DULAGLUTIDE 1.5 MG/0.5ML ~~LOC~~ SOAJ: 1.5000 mg | SUBCUTANEOUS | Status: DC

## 2015-02-16 MED ORDER — MIDAZOLAM HCL 5 MG/5ML IJ SOLN
INTRAMUSCULAR | Status: DC | PRN
  Administered 2015-02-16: 2 mg via INTRAVENOUS

## 2015-02-16 MED ORDER — HYDROMORPHONE HCL 1 MG/ML IJ SOLN
INTRAMUSCULAR | Status: DC | PRN
  Administered 2015-02-16 (×2): 0.5 mg via INTRAVENOUS
  Administered 2015-02-16: 1 mg via INTRAVENOUS

## 2015-02-16 MED ORDER — DEXAMETHASONE SODIUM PHOSPHATE 10 MG/ML IJ SOLN: 10.0000 mg | Freq: Once | INTRAMUSCULAR | Status: DC

## 2015-02-16 MED ORDER — PROPOFOL 10 MG/ML IV BOLUS
INTRAVENOUS | Status: DC | PRN
  Administered 2015-02-16: 300 mg via INTRAVENOUS

## 2015-02-16 MED ORDER — BISACODYL 5 MG PO TBEC: 5.0000 mg | DELAYED_RELEASE_TABLET | Freq: Every day | ORAL | Status: DC | PRN

## 2015-02-16 MED ORDER — ONDANSETRON HCL 4 MG/2ML IJ SOLN
INTRAMUSCULAR | Status: DC | PRN
Start: 2015-02-16 — End: 2015-02-16
  Administered 2015-02-16: 4 mg via INTRAVENOUS

## 2015-02-16 MED ORDER — BUPIVACAINE-EPINEPHRINE (PF) 0.25% -1:200000 IJ SOLN
INTRAMUSCULAR | Status: AC
  Filled 2015-02-16: qty 30

## 2015-02-16 MED ORDER — DOCUSATE SODIUM 100 MG PO CAPS
100.0000 mg | ORAL_CAPSULE | Freq: Two times a day (BID) | ORAL | Status: DC
  Administered 2015-02-16 – 2015-02-19 (×6): 100 mg via ORAL

## 2015-02-16 MED ORDER — POTASSIUM CHLORIDE IN NACL 20-0.9 MEQ/L-% IV SOLN
INTRAVENOUS | Status: DC
  Administered 2015-02-16: 20:00:00 via INTRAVENOUS
  Filled 2015-02-16 (×6): qty 1000

## 2015-02-16 MED ORDER — LOSARTAN POTASSIUM 50 MG PO TABS
50.0000 mg | ORAL_TABLET | Freq: Every day | ORAL | Status: DC
  Administered 2015-02-17 – 2015-02-19 (×3): 50 mg via ORAL
  Filled 2015-02-16 (×3): qty 1

## 2015-02-16 MED ORDER — BUPIVACAINE-EPINEPHRINE 0.25% -1:200000 IJ SOLN
INTRAMUSCULAR | Status: DC | PRN
  Administered 2015-02-16: 30 mL

## 2015-02-16 MED ORDER — ALUM & MAG HYDROXIDE-SIMETH 200-200-20 MG/5ML PO SUSP: 30.0000 mL | ORAL | Status: DC | PRN

## 2015-02-16 MED ORDER — ACETAMINOPHEN 650 MG RE SUPP: 650.0000 mg | Freq: Four times a day (QID) | RECTAL | Status: DC | PRN

## 2015-02-16 MED ORDER — LACTATED RINGERS IV SOLN
INTRAVENOUS | Status: DC
  Administered 2015-02-16: 1000 mL via INTRAVENOUS
  Administered 2015-02-16: 14:00:00 via INTRAVENOUS

## 2015-02-16 MED ORDER — SODIUM CHLORIDE 0.9 % IR SOLN
Status: DC | PRN
  Administered 2015-02-16: 3000 mL

## 2015-02-16 MED ORDER — NEOSTIGMINE METHYLSULFATE 10 MG/10ML IV SOLN
INTRAVENOUS | Status: DC | PRN
  Administered 2015-02-16: 3 mg via INTRAVENOUS

## 2015-02-16 MED ORDER — CEFAZOLIN SODIUM-DEXTROSE 2-3 GM-% IV SOLR
2.0000 g | Freq: Four times a day (QID) | INTRAVENOUS | Status: AC
  Administered 2015-02-16 – 2015-02-17 (×2): 2 g via INTRAVENOUS
  Filled 2015-02-16 (×2): qty 50

## 2015-02-16 MED ORDER — METHOCARBAMOL 1000 MG/10ML IJ SOLN
500.0000 mg | Freq: Four times a day (QID) | INTRAMUSCULAR | Status: DC | PRN
  Filled 2015-02-16: qty 5

## 2015-02-16 MED ORDER — ENOXAPARIN SODIUM 30 MG/0.3ML ~~LOC~~ SOLN
30.0000 mg | Freq: Two times a day (BID) | SUBCUTANEOUS | Status: DC
  Administered 2015-02-17 – 2015-02-19 (×5): 30 mg via SUBCUTANEOUS
  Filled 2015-02-16 (×8): qty 0.3

## 2015-02-16 MED ORDER — METHOCARBAMOL 500 MG PO TABS
500.0000 mg | ORAL_TABLET | Freq: Four times a day (QID) | ORAL | Status: DC | PRN
  Administered 2015-02-16 – 2015-02-19 (×7): 500 mg via ORAL
  Filled 2015-02-16 (×7): qty 1

## 2015-02-16 MED ORDER — FENTANYL CITRATE (PF) 100 MCG/2ML IJ SOLN
INTRAMUSCULAR | Status: AC
  Filled 2015-02-16: qty 4

## 2015-02-16 MED ORDER — SODIUM CHLORIDE 0.9 % IJ SOLN
INTRAMUSCULAR | Status: AC
  Filled 2015-02-16: qty 50

## 2015-02-16 MED ORDER — FLEET ENEMA 7-19 GM/118ML RE ENEM: 1.0000 | ENEMA | Freq: Once | RECTAL | Status: DC | PRN

## 2015-02-16 MED ORDER — OXYCODONE HCL 5 MG PO TABS
5.0000 mg | ORAL_TABLET | ORAL | Status: DC | PRN
  Administered 2015-02-16 (×2): 5 mg via ORAL
  Administered 2015-02-17 – 2015-02-19 (×13): 10 mg via ORAL
  Filled 2015-02-16 (×2): qty 2
  Filled 2015-02-16: qty 1
  Filled 2015-02-16 (×10): qty 2
  Filled 2015-02-16: qty 1
  Filled 2015-02-16 (×2): qty 2

## 2015-02-16 MED ORDER — GLYCOPYRROLATE 0.2 MG/ML IJ SOLN
INTRAMUSCULAR | Status: DC | PRN
  Administered 2015-02-16: 0.4 mg via INTRAVENOUS

## 2015-02-16 MED ORDER — HYDROCHLOROTHIAZIDE 12.5 MG PO CAPS
12.5000 mg | ORAL_CAPSULE | Freq: Every day | ORAL | Status: DC
  Administered 2015-02-17 – 2015-02-19 (×3): 12.5 mg via ORAL
  Filled 2015-02-16 (×3): qty 1

## 2015-02-16 MED ORDER — PHENOL 1.4 % MT LIQD
1.0000 | OROMUCOSAL | Status: DC | PRN
  Filled 2015-02-16: qty 177

## 2015-02-16 MED ORDER — ZOLPIDEM TARTRATE 5 MG PO TABS: 5.0000 mg | ORAL_TABLET | Freq: Every evening | ORAL | Status: DC | PRN

## 2015-02-16 MED ORDER — FENTANYL CITRATE (PF) 250 MCG/5ML IJ SOLN
INTRAMUSCULAR | Status: AC
  Filled 2015-02-16: qty 25

## 2015-02-16 MED ORDER — ROCURONIUM BROMIDE 100 MG/10ML IV SOLN
INTRAVENOUS | Status: DC | PRN
  Administered 2015-02-16: 50 mg via INTRAVENOUS

## 2015-02-16 MED ORDER — MIDAZOLAM HCL 2 MG/2ML IJ SOLN
INTRAMUSCULAR | Status: AC
  Filled 2015-02-16: qty 4

## 2015-02-16 MED ORDER — ACETAMINOPHEN 325 MG PO TABS
650.0000 mg | ORAL_TABLET | Freq: Four times a day (QID) | ORAL | Status: DC | PRN
  Administered 2015-02-17: 650 mg via ORAL
  Filled 2015-02-16: qty 2

## 2015-02-16 MED ORDER — KETOROLAC TROMETHAMINE 30 MG/ML IJ SOLN
INTRAMUSCULAR | Status: AC
  Filled 2015-02-16: qty 1

## 2015-02-16 MED ORDER — SUCCINYLCHOLINE CHLORIDE 20 MG/ML IJ SOLN
INTRAMUSCULAR | Status: DC | PRN
  Administered 2015-02-16: 100 mg via INTRAVENOUS

## 2015-02-16 MED ORDER — HYDROMORPHONE HCL 1 MG/ML IJ SOLN: 0.2500 mg | INTRAMUSCULAR | Status: DC | PRN

## 2015-02-16 MED ORDER — ONDANSETRON HCL 4 MG PO TABS: 4.0000 mg | ORAL_TABLET | Freq: Four times a day (QID) | ORAL | Status: DC | PRN

## 2015-02-16 MED ORDER — FENTANYL CITRATE (PF) 100 MCG/2ML IJ SOLN
INTRAMUSCULAR | Status: DC | PRN
  Administered 2015-02-16: 100 ug via INTRAVENOUS
  Administered 2015-02-16 (×5): 50 ug via INTRAVENOUS

## 2015-02-16 MED ORDER — ONDANSETRON HCL 4 MG/2ML IJ SOLN: 4.0000 mg | Freq: Four times a day (QID) | INTRAMUSCULAR | Status: DC | PRN

## 2015-02-16 MED ORDER — KETOROLAC TROMETHAMINE 30 MG/ML IJ SOLN
INTRAMUSCULAR | Status: DC | PRN
  Administered 2015-02-16: 30 mg

## 2015-02-16 SURGICAL SUPPLY — 59 items
BAG DECANTER FOR FLEXI CONT (MISCELLANEOUS) IMPLANT
BAG SPEC THK2 15X12 ZIP CLS (MISCELLANEOUS) ×1
BAG ZIPLOCK 12X15 (MISCELLANEOUS) ×3 IMPLANT
BANDAGE ELASTIC 3 VELCRO ST LF (GAUZE/BANDAGES/DRESSINGS) ×1 IMPLANT
BANDAGE ELASTIC 4 VELCRO ST LF (GAUZE/BANDAGES/DRESSINGS) ×2 IMPLANT
BANDAGE ELASTIC 6 VELCRO ST LF (GAUZE/BANDAGES/DRESSINGS) ×2 IMPLANT
BLADE SAG 18X100X1.27 (BLADE) ×2 IMPLANT
BLADE SAW SGTL 13.0X1.19X90.0M (BLADE) ×2 IMPLANT
BONE CEMENT GENTAMICIN (Cement) ×4 IMPLANT
CAP KNEE TOTAL 3 SIGMA ×1 IMPLANT
CEMENT BONE GENTAMICIN 40 (Cement) IMPLANT
CLOTH BEACON ORANGE TIMEOUT ST (SAFETY) ×2 IMPLANT
CUFF TOURN SGL QUICK 34 (TOURNIQUET CUFF) ×2
CUFF TRNQT CYL 34X4X40X1 (TOURNIQUET CUFF) ×1 IMPLANT
DECANTER SPIKE VIAL GLASS SM (MISCELLANEOUS) ×2 IMPLANT
DRAPE U-SHAPE 47X51 STRL (DRAPES) ×2 IMPLANT
DRSG AQUACEL AG ADV 3.5X10 (GAUZE/BANDAGES/DRESSINGS) ×2 IMPLANT
DRSG AQUACEL AG ADV 3.5X14 (GAUZE/BANDAGES/DRESSINGS) ×1 IMPLANT
DRSG TEGADERM 4X4.75 (GAUZE/BANDAGES/DRESSINGS) ×2 IMPLANT
DURAPREP 26ML APPLICATOR (WOUND CARE) ×4 IMPLANT
ELECT REM PT RETURN 9FT ADLT (ELECTROSURGICAL) ×2
ELECTRODE REM PT RTRN 9FT ADLT (ELECTROSURGICAL) ×1 IMPLANT
EVACUATOR 1/8 PVC DRAIN (DRAIN) ×2 IMPLANT
GAUZE SPONGE 2X2 8PLY STRL LF (GAUZE/BANDAGES/DRESSINGS) ×1 IMPLANT
GLOVE BIOGEL PI IND STRL 8 (GLOVE) ×2 IMPLANT
GLOVE BIOGEL PI INDICATOR 8 (GLOVE) ×2
GLOVE SURG ORTHO 8.0 STRL STRW (GLOVE) ×2 IMPLANT
GLOVE SURG ORTHO 9.0 STRL STRW (GLOVE) ×2 IMPLANT
GLOVE SURG SS PI 7.5 STRL IVOR (GLOVE) ×2 IMPLANT
GOWN STRL REUS W/TWL XL LVL3 (GOWN DISPOSABLE) ×4 IMPLANT
HANDPIECE INTERPULSE COAX TIP (DISPOSABLE) ×2
IMMOBILIZER KNEE 20 (SOFTGOODS) ×2
IMMOBILIZER KNEE 20 THIGH 36 (SOFTGOODS) ×1 IMPLANT
NS IRRIG 1000ML POUR BTL (IV SOLUTION) ×3 IMPLANT
PACK TOTAL KNEE CUSTOM (KITS) ×2 IMPLANT
POSITIONER SURGICAL ARM (MISCELLANEOUS) ×2 IMPLANT
SET HNDPC FAN SPRY TIP SCT (DISPOSABLE) ×1 IMPLANT
SET PAD KNEE POSITIONER (MISCELLANEOUS) ×2 IMPLANT
SPONGE GAUZE 2X2 STER 10/PKG (GAUZE/BANDAGES/DRESSINGS) ×1
SPONGE LAP 18X18 X RAY DECT (DISPOSABLE) ×1 IMPLANT
SPONGE SURGIFOAM ABS GEL 100 (HEMOSTASIS) ×2 IMPLANT
STOCKINETTE 6  STRL (DRAPES) ×1
STOCKINETTE 6 STRL (DRAPES) ×1 IMPLANT
SUCTION FRAZIER 12FR DISP (SUCTIONS) ×1 IMPLANT
SUT BONE WAX W31G (SUTURE) IMPLANT
SUT MNCRL AB 3-0 PS2 18 (SUTURE) ×2 IMPLANT
SUT VIC AB 1 CT1 27 (SUTURE) ×8
SUT VIC AB 1 CT1 27XBRD ANTBC (SUTURE) ×4 IMPLANT
SUT VIC AB 2-0 CT1 27 (SUTURE) ×4
SUT VIC AB 2-0 CT1 TAPERPNT 27 (SUTURE) ×2 IMPLANT
SUT VLOC 180 0 24IN GS25 (SUTURE) ×2 IMPLANT
SYR 50ML LL SCALE MARK (SYRINGE) ×1 IMPLANT
TAPE STRIPS DRAPE STRL (GAUZE/BANDAGES/DRESSINGS) ×2 IMPLANT
TOWER CARTRIDGE SMART MIX (DISPOSABLE) ×2 IMPLANT
TRAY FOLEY W/METER SILVER 14FR (SET/KITS/TRAYS/PACK) ×2 IMPLANT
TRAY FOLEY W/METER SILVER 16FR (SET/KITS/TRAYS/PACK) ×1 IMPLANT
WATER STERILE IRR 1500ML POUR (IV SOLUTION) ×4 IMPLANT
WRAP KNEE MAXI GEL POST OP (GAUZE/BANDAGES/DRESSINGS) ×2 IMPLANT
YANKAUER SUCT BULB TIP 10FT TU (MISCELLANEOUS) ×1 IMPLANT

## 2015-02-16 NOTE — Anesthesia Procedure Notes (Signed)
Procedure Name: Intubation Date/Time: 02/16/2015 1:22 PM Performed by: Maxwell Caul Pre-anesthesia Checklist: Patient identified, Emergency Drugs available, Suction available and Patient being monitored Patient Re-evaluated:Patient Re-evaluated prior to inductionOxygen Delivery Method: Circle System Utilized Preoxygenation: Pre-oxygenation with 100% oxygen Intubation Type: IV induction Ventilation: Mask ventilation without difficulty Laryngoscope Size: Mac and 4 Grade View: Grade I Tube type: Oral Tube size: 7.5 mm Number of attempts: 1 Airway Equipment and Method: Stylet Placement Confirmation: ETT inserted through vocal cords under direct vision,  positive ETCO2 and breath sounds checked- equal and bilateral Secured at: 21 cm Tube secured with: Tape Dental Injury: Teeth and Oropharynx as per pre-operative assessment

## 2015-02-16 NOTE — Progress Notes (Signed)
Patient states  She took 3 days worth of antibiotics called in by Dr Theda Sers office

## 2015-02-16 NOTE — Anesthesia Postprocedure Evaluation (Signed)
  Anesthesia Post-op Note  Patient: [name]  Procedure(s) Performed: Procedure(s) (LRB): LEFT TOTAL KNEE ARTHROPLASTY (Left)  Patient Location: PACU  Anesthesia Type: General  Level of Consciousness: awake and alert   Airway and Oxygen Therapy: Patient Spontanous Breathing  Post-op Pain: mild  Post-op Assessment: Post-op Vital signs reviewed, Patient's Cardiovascular Status Stable, Respiratory Function Stable, Patent Airway and No signs of Nausea or vomiting  Last Vitals:  Filed Vitals:   02/16/15 1727  BP: 162/99  Pulse: 92  Temp: 36.4 C  Resp:     Post-op Vital Signs: stable   Complications: No apparent anesthesia complications

## 2015-02-16 NOTE — Op Note (Signed)
DATE OF SURGERY:  02/16/2015  TIME: 3:19 PM  PATIENT NAME:  [name]    AGE: 69 y.o.   PRE-OPERATIVE DIAGNOSIS:  LEFT KNEE OSTEOARTHRITIS  POST-OPERATIVE DIAGNOSIS:  LEFT KNEE OSTEOARTHRITIS  PROCEDURE:  Procedure(s): LEFT TOTAL KNEE ARTHROPLASTY  SURGEON:  Kabao Leite ANDREW  ASSISTANT:  Bryson Stilwell, PA-C, present and scrubbed throughout the case, critical for assistance with exposure, retraction, instrumentation, and closure.  OPERATIVE IMPLANTS: Depuy PFC Sigma Rotating Platform.  Femur size 4, Tibia size 4, Patella size 35 3-peg oval button, with a 12.5 mm polyethylene insert.   PREOPERATIVE INDICATIONS:   [name] is a 69 y.o. year old female with end stage bone on bone arthritis of the knee who failed conservative treatment and elected for Total Knee Arthroplasty.   The risks, benefits, and alternatives were discussed at length including but not limited to the risks of infection, bleeding, nerve injury, stiffness, blood clots, the need for revision surgery, cardiopulmonary complications, among others, and they were willing to proceed.  OPERATIVE DESCRIPTION:  The patient was brought to the operative room and placed in a supine position.  Spinal anesthesia was administered.  IV antibiotics were given.  The lower extremity was prepped and draped in the usual sterile fashion.  Time out was performed.  The leg was elevated and exsanguinated and the tourniquet was inflated.  Anterior quadriceps tendon splitting approach was performed.  The patella was retracted and osteophytes were removed.  The anterior horn of the medial and lateral meniscus was removed and cruciate ligaments resected.   The distal femur was opened with the drill and the intramedullary distal femoral cutting jig was utilized, set at 5 degrees resecting 10 mm off the distal femur.  Care was taken to protect the collateral ligaments.  The distal femoral sizing jig was applied, taking care to  avoid notching.  Then the 4-in-1 cutting jig was applied and the anterior and posterior femur was cut, along with the chamfer cuts.    Then the extramedullary tibial cutting jig was utilized making the appropriate cut using the anterior tibial crest as a reference building in appropriate posterior slope.  Care was taken during the cut to protect the medial and collateral ligaments.  The proximal tibia was removed along with the posterior horns of the menisci.   The posterior medial femoral osteophytes and posterior lateral femoral osteophytes were removed.    The flexion gap was then measured and was symmetric with the extension gap, measured at 12.  I completed the distal femoral preparation using the appropriate jig to prepare the box.  The patella was then measured, and cut with the saw.    The proximal tibia sized and prepared accordingly with the reamer and the punch, and then all components were trialed with the trial insert.  The knee was found to have excellent balance and full motion.    The above named components were then cemented into place and all excess cement was removed.  The trial polyethylene component was in place during cementation, and then was exchanged for the real polyethylene component.    The knee was easily taken through a range of motion and the patella tracked well and the knee irrigated copiously and the parapatellar and subcutaneous tissue closed with vicryl, and monocryl with steri strips for the skin.  The arthrotomy was closed at 90 of flexion. The wounds were dressed with sterile gauze and the tourniquet released and the patient was awakened and returned to the PACU  in stable and satisfactory condition.  There were no complications.  Total tourniquet time was 90 minutes.

## 2015-02-16 NOTE — Progress Notes (Signed)
Utilization review completed.  

## 2015-02-16 NOTE — Interval H&P Note (Signed)
History and Physical Interval Note:  02/16/2015 1:12 PM  [name]  has presented today for surgery, with the diagnosis of LEFT KNEE OSTEOARTHRITIS  The various methods of treatment have been discussed with the patient and family. After consideration of risks, benefits and other options for treatment, the patient has consented to  Procedure(s): LEFT TOTAL KNEE ARTHROPLASTY (Left) as a surgical intervention .  The patient's history has been reviewed, patient examined, no change in status, stable for surgery.  I have reviewed the patient's chart and labs.  Questions were answered to the patient's satisfaction.     Keari Miu ANDREW

## 2015-02-16 NOTE — Transfer of Care (Signed)
Immediate Anesthesia Transfer of Care Note  Patient: [name]  Procedure(s) Performed: Procedure(s): LEFT TOTAL KNEE ARTHROPLASTY (Left)  Patient Location: PACU  Anesthesia Type:General  Level of Consciousness:  sedated, patient cooperative and responds to stimulation  Airway & Oxygen Therapy:Patient Spontanous Breathing and Patient connected to face mask oxgen  Post-op Assessment:  Report given to PACU RN and Post -op Vital signs reviewed and stable  Post vital signs:  Reviewed and stable  Last Vitals:  Filed Vitals:   02/16/15 0940  BP: 118/94  Pulse: 76  Temp: 36.6 C  Resp: 18    Complications: No apparent anesthesia complications

## 2015-02-16 NOTE — Anesthesia Preprocedure Evaluation (Addendum)
Anesthesia Evaluation  Patient identified by MRN, date of birth, ID band Patient awake    Reviewed: Allergy & Precautions, NPO status , Patient's Chart, lab work & pertinent test results  Airway Mallampati: II  TM Distance: >3 FB Neck ROM: Full    Dental   Pulmonary sleep apnea , former smoker,    breath sounds clear to auscultation       Cardiovascular hypertension,  Rhythm:Regular Rate:Normal     Neuro/Psych    GI/Hepatic negative GI ROS, Neg liver ROS,   Endo/Other  diabetes  Renal/GU      Musculoskeletal   Abdominal   Peds  Hematology   Anesthesia Other Findings   Reproductive/Obstetrics                           Anesthesia Physical Anesthesia Plan  ASA: III  Anesthesia Plan: General   Post-op Pain Management:    Induction: Intravenous  Airway Management Planned: Oral ETT  Additional Equipment:   Intra-op Plan:   Post-operative Plan: Extubation in OR  Informed Consent: I have reviewed the patients History and Physical, chart, labs and discussed the procedure including the risks, benefits and alternatives for the proposed anesthesia with the patient or authorized representative who has indicated his/her understanding and acceptance.   Dental advisory given  Plan Discussed with: CRNA and Anesthesiologist  Anesthesia Plan Comments:        Anesthesia Quick Evaluation

## 2015-02-17 LAB — BASIC METABOLIC PANEL
ANION GAP: 9 (ref 5–15)
BUN: 16 mg/dL (ref 6–20)
CHLORIDE: 103 mmol/L (ref 101–111)
CO2: 27 mmol/L (ref 22–32)
Calcium: 8.9 mg/dL (ref 8.9–10.3)
Creatinine, Ser: 0.96 mg/dL (ref 0.44–1.00)
GFR calc Af Amer: >60 mL/min (ref >60)
GFR, EST NON AFRICAN AMERICAN: 60 mL/min — AB (ref >60)
GLUCOSE: 133 mg/dL — AB (ref 65–99)
POTASSIUM: 4.1 mmol/L (ref 3.5–5.1)
Sodium: 139 mmol/L (ref 135–145)

## 2015-02-17 LAB — CBC
HEMATOCRIT: 34.8 % — AB (ref 36.0–46.0)
HEMOGLOBIN: 11.6 g/dL — AB (ref 12.0–15.0)
MCH: 28.3 pg (ref 26.0–34.0)
MCHC: 33.3 g/dL (ref 30.0–36.0)
MCV: 84.9 fL (ref 78.0–100.0)
Platelets: 262 10*3/uL (ref 150–400)
RBC: 4.1 MIL/uL (ref 3.87–5.11)
RDW: 14.1 % (ref 11.5–15.5)
WBC: 12.4 10*3/uL — AB (ref 4.0–10.5)

## 2015-02-17 LAB — GLUCOSE, CAPILLARY
GLUCOSE-CAPILLARY: 151 mg/dL — AB (ref 65–99)
GLUCOSE-CAPILLARY: 187 mg/dL — AB (ref 65–99)
Glucose-Capillary: 137 mg/dL — ABNORMAL HIGH (ref 65–99)
Glucose-Capillary: 201 mg/dL — ABNORMAL HIGH (ref 65–99)

## 2015-02-17 NOTE — Progress Notes (Signed)
OT Cancellation Note  Patient Details Name: [name] MRN: [id number] DOB: [birth year]   Cancelled Treatment:    Reason Eval/Treat Not Completed: Other (comment). Pt just back to bed. Will check back in am.    Esiquio Boesen 02/17/2015, 1:43 PM  Lesle Chris, OTR/L [phone] 02/17/2015

## 2015-02-17 NOTE — Progress Notes (Signed)
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 Myan Suit, [employer]

## 2015-02-17 NOTE — Progress Notes (Signed)
Pt not yet voided. Bladder scanned for 500cc, I&O cath obtained 400cc dark amber urine. Pt tolerated well. Brigido Mera, [employer]

## 2015-02-17 NOTE — Progress Notes (Signed)
Physical Therapy Treatment Patient Details Name: [name] MRN: [id number] DOB: 02/26/1946 Today's Date: March 17, 2015    History of Present Illness s/p L TKA    PT Comments    Pt back in  bed, tired this pm, L knee hurting a some and pt groggy d/t meds--amb deferred and focused on HEP this session  Follow Up Recommendations  Home health PT     Equipment Recommendations  None recommended by PT    Recommendations for Other Services       Precautions / Restrictions Precautions Precautions: Knee Required Braces or Orthoses: Knee Immobilizer - Left Knee Immobilizer - Left: Discontinue once straight leg raise with < 10 degree lag    Mobility  Bed Mobility                  Transfers                    Ambulation/Gait                 Stairs            Wheelchair Mobility    Modified Rankin (Stroke Patients Only)       Balance                                    Cognition Arousal/Alertness: Suspect due to medications (sleepy) Behavior During Therapy: WFL for tasks assessed/performed Overall Cognitive Status: Within Functional Limits for tasks assessed                      Exercises Total Joint Exercises Ankle Circles/Pumps: AROM;Both;15 reps Quad Sets: Strengthening;Both;10 reps Short Arc Quad: AROM;Strengthening;Left;10 reps Heel Slides: AAROM;Left;10 reps Hip ABduction/ADduction: AAROM;AROM;Left;10 reps Straight Leg Raises: AAROM;Left;10 reps    General Comments        Pertinent Vitals/Pain Pain Assessment: 0-10 Pain Score: 2  Pain Location: L knee Pain Descriptors / Indicators: Sore Pain Intervention(s): Limited activity within patient's tolerance;Monitored during session    Home Living                      Prior Function            PT Goals (current goals can now be found in the care plan section) Acute Rehab PT Goals Patient Stated Goal: walk without pain PT Goal Formulation:  With patient Time For Goal Achievement: 02/24/15 Potential to Achieve Goals: Good Progress towards PT goals: Progressing toward goals    Frequency  7X/week    PT Plan Current plan remains appropriate    Co-evaluation             End of Session   Activity Tolerance: Patient tolerated treatment well Patient left: in bed;with call bell/phone within reach;with family/visitor present;with bed alarm set     Time: 6387-5643 PT Time Calculation (min) (ACUTE ONLY): 14 min  Charges:  $Therapeutic Exercise: 8-22 mins                    G Codes:      [name] 2015/03/17, 3:58 PM

## 2015-02-17 NOTE — Care Management Note (Signed)
Case Management Note  Patient Details  Name: [name] MRN: [id number] Date of Birth: 05/22/1945  Subjective/Objective:                  LEFT TOTAL KNEE ARTHROPLASTY   Action/Plan: Discharge planning  Expected Discharge Date:   02/18/15           Expected Discharge Plan:  [place]  In-House Referral:  NA  Discharge planning Services  CM Consult  Post Acute Care Choice:  Home Health Choice offered to:  Patient  DME Arranged:  N/A DME Agency:  NA  HH Arranged:  PT HH Agency:  [place]  Status of Service:  Completed, signed off  Medicare Important Message Given:    Date Medicare IM Given:    Medicare IM give by:    Date Additional Medicare IM Given:    Additional Medicare Important Message give by:     If discussed at [place] of Stay Meetings, dates discussed:    Additional Comments: CM spoke with patient and her daughter at the bedside. Patient has a 3N1 and RW at home. She has selected [facility] for HHPT. CM called [facility] to provide the referral information and faxed the order and H&P to [facility].  Apolonio Schneiders, RN 02/17/2015, 11:54 AM

## 2015-02-17 NOTE — Evaluation (Signed)
Physical Therapy Evaluation Patient Details Name: [name] MRN: [id number] DOB: Sep 01, 1945 Today's Date: 02/17/2015   History of Present Illness  s/p L TKA  Clinical Impression  Pt is s/p L TKA resulting in the deficits listed below (see PT Problem List).  Pt will benefit from skilled PT to increase their independence and safety with mobility to allow discharge to the venue listed below. Plan is for home with HHPT     Follow Up Recommendations Home health PT    Equipment Recommendations  None recommended by PT    Recommendations for Other Services       Precautions / Restrictions Precautions Precautions: Knee Required Braces or Orthoses: Knee Immobilizer - Left Knee Immobilizer - Left: Discontinue once straight leg raise with < 10 degree lag      Mobility  Bed Mobility Overal bed mobility: Needs Assistance Bed Mobility: Supine to Sit     Supine to sit: Min assist     General bed mobility comments: assist with LLE, cues for technique  Transfers Overall transfer level: Needs assistance Equipment used: Rolling walker (2 wheeled) Transfers: Sit to/from Stand Sit to Stand: Min assist;+2 safety/equipment         General transfer comment: multimodal cues for sequence, posture and RW position  Ambulation/Gait Ambulation/Gait assistance: +2 safety/equipment Ambulation Distance (Feet): 40 Feet Assistive device: Rolling walker (2 wheeled) Gait Pattern/deviations: Step-to pattern;Decreased step length - right;Decreased step length - left;Trunk flexed;Antalgic Gait velocity: decr   General Gait Details: multi-modal cues for  sequence, RW position and step length  Stairs            Wheelchair Mobility    Modified Rankin (Stroke Patients Only)       Balance Overall balance assessment: Needs assistance           Standing balance-Leahy Scale: Poor                               Pertinent Vitals/Pain Pain Assessment: No/denies pain     Home Living Family/patient expects to be discharged to:: Private residence Living Arrangements: Children   Type of Home: House Home Access: Stairs to enter   Technical brewer of Steps: 3 Home Layout: One level Home Equipment: Environmental consultant - 2 wheels;Bedside commode;Cane - single point      Prior Function Level of Independence: Independent with assistive device(s);Independent         Comments: amb with cane     Hand Dominance        Extremity/Trunk Assessment               Lower Extremity Assessment: LLE deficits/detail   LLE Deficits / Details: ankle WFL, knee and hip grossly 2-/5     Communication   Communication: No difficulties  Cognition Arousal/Alertness:  (very sleepy, arouses easily) Behavior During Therapy: WFL for tasks assessed/performed Overall Cognitive Status: Within Functional Limits for tasks assessed                      General Comments      Exercises        Assessment/Plan    PT Assessment Patient needs continued PT services  PT Diagnosis Difficulty walking   PT Problem List Decreased strength;Decreased range of motion;Decreased activity tolerance;Decreased mobility;Decreased knowledge of use of DME;Obesity  PT Treatment Interventions DME instruction;Gait training;Stair training;Functional mobility training;Therapeutic activities;Therapeutic exercise;Patient/family education   PT Goals (Current goals can be found  in the Care Plan section) Acute Rehab PT Goals Patient Stated Goal: walk without pain PT Goal Formulation: With patient Time For Goal Achievement: 02/24/15 Potential to Achieve Goals: Good    Frequency 7X/week   Barriers to discharge        Co-evaluation               End of Session Equipment Utilized During Treatment: Gait belt Activity Tolerance: Patient tolerated treatment well Patient left: in chair;with call bell/phone within reach;with family/visitor present Nurse Communication: Mobility  status         Time: 0802-2336 PT Time Calculation (min) (ACUTE ONLY): 23 min   Charges:   PT Evaluation $Initial PT Evaluation Tier I: 1 Procedure PT Treatments $Gait Training: 8-22 mins   PT G Codes:        Bawi Lakins 03-05-2015, 11:39 AM

## 2015-02-17 NOTE — Progress Notes (Signed)
[name]  MRN: [id number] DOB/Age: [birth year] 67 y.o. Physician: Rada Hay Procedure: Procedure(s) (LRB): LEFT TOTAL KNEE ARTHROPLASTY (Left)     Subjective: Awake and alert. Pain meds adjusted through the night so more comfortable.   Vital Signs Temp:  [97.6 F (36.4 C)-99 F (37.2 C)] 99 F (37.2 C) (11/05 0455) Pulse Rate:  [68-94] 68 (11/05 0455) Resp:  [11-19] 14 (11/05 0455) BP: (118-173)/(62-99) 138/70 mmHg (11/05 0455) SpO2:  [93 %-100 %] 97 % (11/05 0455) Weight:  [133.811 kg (295 lb)] 133.811 kg (295 lb) (11/04 0950)  Lab Results  Recent Labs  02/17/15 0442  WBC 12.4*  HGB 11.6*  HCT 34.8*  PLT 262   BMET  Recent Labs  02/17/15 0442  NA 139  K 4.1  CL 103  CO2 27  GLUCOSE 133*  BUN 16  CREATININE 0.96  CALCIUM 8.9   INR  Date Value Ref Range Status  02/08/2015 1.04 0.00 - 1.49 Final     Exam Left knee dressings dry hemovac dc"d without difficulty NVI        Plan PT/OT per protocol DC when goals met Advance activities  [facility] for Dr.Kevin Supple 02/17/2015, 8:20 AM Contact # [phone]

## 2015-02-18 LAB — CBC
HEMATOCRIT: 35.5 % — AB (ref 36.0–46.0)
Hemoglobin: 11.9 g/dL — ABNORMAL LOW (ref 12.0–15.0)
MCH: 29.1 pg (ref 26.0–34.0)
MCHC: 33.5 g/dL (ref 30.0–36.0)
MCV: 86.8 fL (ref 78.0–100.0)
Platelets: 247 10*3/uL (ref 150–400)
RBC: 4.09 MIL/uL (ref 3.87–5.11)
RDW: 14.4 % (ref 11.5–15.5)
WBC: 12.1 10*3/uL — AB (ref 4.0–10.5)

## 2015-02-18 LAB — GLUCOSE, CAPILLARY
GLUCOSE-CAPILLARY: 195 mg/dL — AB (ref 65–99)
GLUCOSE-CAPILLARY: 177 mg/dL — AB (ref 65–99)
GLUCOSE-CAPILLARY: 167 mg/dL — AB (ref 65–99)
GLUCOSE-CAPILLARY: 172 mg/dL — AB (ref 65–99)

## 2015-02-18 MED ORDER — ASPIRIN EC 325 MG PO TBEC: 325.0000 mg | DELAYED_RELEASE_TABLET | Freq: Two times a day (BID) | ORAL | Status: DC

## 2015-02-18 MED ORDER — FERROUS SULFATE 325 (65 FE) MG PO TABS: 325.0000 mg | ORAL_TABLET | Freq: Three times a day (TID) | ORAL | Status: DC

## 2015-02-18 MED ORDER — TAMSULOSIN HCL 0.4 MG PO CAPS
0.4000 mg | ORAL_CAPSULE | Freq: Once | ORAL | Status: AC
  Administered 2015-02-18: 0.4 mg via ORAL
  Filled 2015-02-18: qty 1

## 2015-02-18 MED ORDER — METHOCARBAMOL 500 MG PO TABS: 500.0000 mg | ORAL_TABLET | Freq: Four times a day (QID) | ORAL | Status: DC | PRN

## 2015-02-18 MED ORDER — OXYCODONE-ACETAMINOPHEN 5-325 MG PO TABS: 1.0000 | ORAL_TABLET | ORAL | Status: DC | PRN

## 2015-02-18 NOTE — Evaluation (Signed)
Occupational Therapy Evaluation Patient Details Name: [name] MRN: [id number] DOB: [birth year] Today's Date: 02/18/2015    History of Present Illness s/p L TKA   Clinical Impression   This 69 year old female was admitted for the above surgery.  She was mod I to I with adls prior to admission. She will benefit from skilled OT in acute and post acute as she has pain and decreased activity tolerance.  Acute goals are for min to min guard assistance for toileting and standing grooming tasks.  Family will assist with bathing and dressing    Follow Up Recommendations  Home health OT    Equipment Recommendations  None recommended by OT    Recommendations for Other Services       Precautions / Restrictions Precautions Precautions: Knee Required Braces or Orthoses: Knee Immobilizer - Left Knee Immobilizer - Left: Discontinue once straight leg raise with < 10 degree lag Restrictions Weight Bearing Restrictions: No      Mobility Bed Mobility   Bed Mobility: Supine to Sit     Supine to sit: Min assist     General bed mobility comments: HOB raised, used bed rails, assist for LLE  Transfers   Equipment used: Rolling walker (2 wheeled) Transfers: Sit to/from Stand Sit to Stand: Mod assist;From elevated surface         General transfer comment: assist to rise and stabilize. Transition was slow.    Balance                                            ADL Overall ADL's : Needs assistance/impaired     Grooming: Set up;Sitting   Upper Body Bathing: Set up;Sitting   Lower Body Bathing: Moderate assistance;Sit to/from stand   Upper Body Dressing : Set up;Sitting   Lower Body Dressing: Total assistance;Sit to/from stand   Toilet Transfer: Moderate assistance;Stand-pivot;RW (to recliner)   Toileting- Clothing Manipulation and Hygiene: Moderate assistance;Sit to/from stand         General ADL Comments: Pt is unable to lift LLE--educated on  reacher, but daughter will assist at home.  She has a tub seat but not bench that she feels she can sit on and bring legs over tub.  Feel she would benefit from [facility] and talked to her about trying this with therapy prior to doing herself to make sure that it is safe.  Reviewed knee precautions/use of KI     Vision     Perception     Praxis      Pertinent Vitals/Pain Pain Score: 5  Pain Location: L knee Pain Descriptors / Indicators: Aching Pain Intervention(s): Limited activity within patient's tolerance;Monitored during session;Repositioned;Premedicated before session;Ice applied     Hand Dominance     Extremity/Trunk Assessment Upper Extremity Assessment Upper Extremity Assessment: Overall WFL for tasks assessed           Communication Communication Communication: No difficulties   Cognition Arousal/Alertness: Awake/alert Behavior During Therapy: WFL for tasks assessed/performed Overall Cognitive Status: Within Functional Limits for tasks assessed                     General Comments       Exercises       Shoulder Instructions      Home Living Family/patient expects to be discharged to:: Private residence Living Arrangements: Children Available Help at  Discharge: Family               Bathroom Shower/Tub: Tub/shower unit Shower/tub characteristics: Architectural technologist: Standard     Home Equipment: Shower seat;Bedside commode;Walker - 2 wheels          Prior Functioning/Environment Level of Independence: Independent with assistive device(s);Independent             OT Diagnosis: Acute pain;Generalized weakness   OT Problem List: Decreased strength;Decreased activity tolerance;Pain;Decreased knowledge of use of DME or AE   OT Treatment/Interventions: Self-care/ADL training;DME and/or AE instruction;Patient/family education    OT Goals(Current goals can be found in the care plan section) Acute Rehab OT Goals Patient Stated Goal:  be able to urinate OT Goal Formulation: With patient Time For Goal Achievement: 02/25/15 Potential to Achieve Goals: Good ADL Goals Pt Will Perform Grooming: with min guard assist;standing Pt Will Transfer to Toilet: ambulating;bedside commode;with min assist Pt Will Perform Toileting - Clothing Manipulation and hygiene: with min assist;sit to/from stand  OT Frequency: Min 2X/week   Barriers to D/C:            Co-evaluation              End of Session    Activity Tolerance: Patient limited by pain;Patient limited by fatigue Patient left: in chair;with call bell/phone within reach;with family/visitor present   Time: 0349-1791 OT Time Calculation (min): 36 min Charges:  OT General Charges $OT Visit: 1 Procedure OT Evaluation $Initial OT Evaluation Tier I: 1 Procedure OT Treatments $Self Care/Home Management : 8-22 mins G-Codes:    [name] Mar 05, 2015, 8:36 AM [name], OTR/L [phone] 03/05/15

## 2015-02-18 NOTE — Progress Notes (Signed)
Physical Therapy Treatment Patient Details Name: [name] MRN: [id number] DOB: Sep 09, 1945 Today's Date: 02/18/2015    History of Present Illness s/p L TKA    PT Comments    Pt  Not ready for D/C today from mobility standpoint; will see in am for further family ed, stair training;  Follow Up Recommendations  Home health PT;Supervision for mobility/OOB     Equipment Recommendations  None recommended by PT    Recommendations for Other Services       Precautions / Restrictions Precautions Precautions: Knee Required Braces or Orthoses: Knee Immobilizer - Left Knee Immobilizer - Left: Discontinue once straight leg raise with < 10 degree lag Restrictions Weight Bearing Restrictions: No Other Position/Activity Restrictions: WBAT    Mobility  Bed Mobility Overal bed mobility: Needs Assistance Bed Mobility: Supine to Sit;Sit to Supine     Supine to sit: Min assist Sit to supine: Mod assist   General bed mobility comments: assist with LLE in/out of bed; requires more assist sit>supine and verbal cues/incr time for scooting and positioning self in supine  Transfers Overall transfer level: Needs assistance Equipment used: Rolling walker (2 wheeled) Transfers: Sit to/from [facility] Sit to Stand: Mod assist;From elevated surface Stand pivot transfers: +2 safety/equipment;Min assist       General transfer comment: pt requires incr time to complete transfer and multi-modal cues for  anterior-superior wt shift, hand placement and LLE position  Ambulation/Gait Ambulation/Gait assistance: Min assist;+2 safety/equipment Ambulation Distance (Feet): 60 Feet (10' more) Assistive device: Rolling walker (2 wheeled) Gait Pattern/deviations: Step-to pattern;Antalgic;Decreased step length - left;Decreased step length - right;Trunk flexed Gait velocity: decr   General Gait Details: multi-modal cues for  sequence, RW position and step length   Stairs             Wheelchair Mobility    Modified Rankin (Stroke Patients Only)       Balance                                    Cognition Arousal/Alertness: Awake/alert Behavior During Therapy: WFL for tasks assessed/performed Overall Cognitive Status: Within Functional Limits for tasks assessed                      Exercises Total Joint Exercises Ankle Circles/Pumps: AROM;Both;15 reps Quad Sets: Strengthening;Both;10 reps Heel Slides: AAROM;Left;10 reps;Limitations Heel Slides Limitations: incr guarding, flexion limited Hip ABduction/ADduction: AAROM;AROM;Left;10 reps Straight Leg Raises: AAROM;Left;10 reps    General Comments        Pertinent Vitals/Pain Pain Assessment: 0-10 Pain Score: 5  Pain Location: L knee Pain Descriptors / Indicators: Aching;Discomfort;Grimacing;Guarding;Sore Pain Intervention(s): Limited activity within patient's tolerance;Monitored during session;Premedicated before session;Ice applied;Repositioned;Patient requesting pain meds-RN notified    Home Living                      Prior Function            PT Goals (current goals can now be found in the care plan section) Acute Rehab PT Goals Patient Stated Goal: walk with less pain PT Goal Formulation: With patient Time For Goal Achievement: 02/24/15 Potential to Achieve Goals: Good Progress towards PT goals: Progressing toward goals    Frequency  7X/week    PT Plan Current plan remains appropriate    Co-evaluation             End of  Session Equipment Utilized During Treatment: Gait belt;Left knee immobilizer Activity Tolerance: Patient tolerated treatment well Patient left: in bed;with call bell/phone within reach;with bed alarm set;with family/visitor present     Time: 7615-1834 PT Time Calculation (min) (ACUTE ONLY): 58 min  Charges:  $Gait Training: 23-37 mins $Therapeutic Exercise: 8-22 mins $Therapeutic Activity: 8-22 mins                     G Codes:      Koral Thaden 05-Mar-2015, 2:45 PM

## 2015-02-18 NOTE — Discharge Summary (Signed)
Physician Discharge Summary  Patient ID: [name] MRN: [id number] DOB/AGE: 1945-11-08 69 y.o.  Admit date: 02/16/2015 Discharge date: 02/18/2015  Admission Diagnoses: Left kneeOA  Discharge Diagnoses:  Active Problems:   S/P knee replacement   Osteoarthritis of left knee   Discharged Condition: good  Hospital Course:  [name] is a 69 y.o. who was admitted to [facility]. They were brought to the operating room on 02/16/2015 and underwent Procedure(s): LEFT TOTAL KNEE ARTHROPLASTY.  Patient tolerated the procedure well and was later transferred to the recovery room and then to the orthopaedic floor for postoperative care.  They were given PO and IV analgesics for pain control following their surgery.  They were given 24 hours of postoperative antibiotics of  Anti-infectives    Start     Dose/Rate Route Frequency Ordered Stop   02/16/15 2000  ceFAZolin (ANCEF) IVPB 2 g/50 mL premix     2 g 100 mL/hr over 30 Minutes Intravenous Every 6 hours 02/16/15 1740 02/17/15 0214   02/16/15 0600  ceFAZolin (ANCEF) 3 g in dextrose 5 % 50 mL IVPB     3 g 160 mL/hr over 30 Minutes Intravenous On call to O.R. 02/15/15 1347 02/16/15 1325     and started on DVT prophylaxis in the form of lovenox.   PT and OT were ordered for total joint protocol.  Discharge planning consulted to help with postop disposition and equipment needs.  Patient had a good night on the evening of surgery and started to get up OOB with therapy on day one.  Hemovac drain was pulled without difficulty.  Continued to work with therapy into day two.  Dressing was with normal limits.  The patient had progressed with therapy and meeting their goals. Patient was seen in rounds and was ready to go home.  Consults: n/a  Significant Diagnostic Studies: routine  Treatments:routine  Discharge Exam: Blood pressure 135/72, pulse 97, temperature 98.6 F (37 C), temperature source Oral, resp. rate 20, height 5\' 8"   (1.727 m), weight 133.811 kg (295 lb), SpO2 96 %. Alert and oriented x3. RRR, Lungs clear, BS x4. Left Calf soft and non tender. L knee dressing C/D/I. No DVT signs. No signs of infection or compartment syndrome. LLE grossly neurovascularly intact.   Disposition: 01-Home or Self Care  Discharge Instructions    Call MD / Call 911    Complete by:  As directed   If you experience chest pain or shortness of breath, CALL 911 and be transported to the hospital emergency room.  If you develope a fever above 101 F, pus (white drainage) or increased drainage or redness at the wound, or calf pain, call your surgeon's office.     Constipation Prevention    Complete by:  As directed   Drink plenty of fluids.  Prune juice may be helpful.  You may use a stool softener, such as Colace (over the counter) 100 mg twice a day.  Use MiraLax (over the counter) for constipation as needed.     Diet - low sodium heart healthy    Complete by:  As directed      Discharge instructions    Complete by:  As directed   INSTRUCTIONS AFTER JOINT REPLACEMENT   Remove items at home which could result in a fall. This includes throw rugs or furniture in walking pathways ICE to the affected joint every three hours while awake for 30 minutes at a time, for at least the first  3-5 days, and then as needed for pain and swelling.  Continue to use ice for pain and swelling. You may notice swelling that will progress down to the foot and ankle.  This is normal after surgery.  Elevate your leg when you are not up walking on it.   Continue to use the breathing machine you got in the hospital (incentive spirometer) which will help keep your temperature down.  It is common for your temperature to cycle up and down following surgery, especially at night when you are not up moving around and exerting yourself.  The breathing machine keeps your lungs expanded and your temperature down.   DIET:  As you were doing prior to hospitalization, we  recommend a well-balanced diet.  DRESSING / WOUND CARE / SHOWERING  Keep the surgical dressing until follow up.  The dressing is water proof, so you can shower without any extra covering.  IF THE DRESSING FALLS OFF or the wound gets wet inside, change the dressing with sterile gauze.  Please use good hand washing techniques before changing the dressing.  Do not use any lotions or creams on the incision until instructed by your surgeon.    ACTIVITY  Increase activity slowly as tolerated, but follow the weight bearing instructions below.   No driving for 6 weeks or until further direction given by your physician.  You cannot drive while taking narcotics.  No lifting or carrying greater than 10 lbs. until further directed by your surgeon. Avoid periods of inactivity such as sitting longer than an hour when not asleep. This helps prevent blood clots.  You may return to work once you are authorized by your doctor.     WEIGHT BEARING   Weight bearing as tolerated with assist device (walker, cane, etc) as directed, use it as long as suggested by your surgeon or therapist, typically at least 4-6 weeks.   EXERCISES  Results after joint replacement surgery are often greatly improved when you follow the exercise, range of motion and muscle strengthening exercises prescribed by your doctor. Safety measures are also important to protect the joint from further injury. Any time any of these exercises cause you to have increased pain or swelling, decrease what you are doing until you are comfortable again and then slowly increase them. If you have problems or questions, call your caregiver or physical therapist for advice.   Rehabilitation is important following a joint replacement. After just a few days of immobilization, the muscles of the leg can become weakened and shrink (atrophy).  These exercises are designed to build up the tone and strength of the thigh and leg muscles and to improve motion. Often  times heat used for twenty to thirty minutes before working out will loosen up your tissues and help with improving the range of motion but do not use heat for the first two weeks following surgery (sometimes heat can increase post-operative swelling).   These exercises can be done on a training (exercise) mat, on the floor, on a table or on a bed. Use whatever works the best and is most comfortable for you.    Use music or television while you are exercising so that the exercises are a pleasant break in your day. This will make your life better with the exercises acting as a break in your routine that you can look forward to.   Perform all exercises about fifteen times, three times per day or as directed.  You should exercise both the operative  leg and the other leg as well.   Exercises include:   Quad Sets - Tighten up the muscle on the front of the thigh (Quad) and hold for 5-10 seconds.   Straight Leg Raises - With your knee straight (if you were given a brace, keep it on), lift the leg to 60 degrees, hold for 3 seconds, and slowly lower the leg.  Perform this exercise against resistance later as your leg gets stronger.  Leg Slides: Lying on your back, slowly slide your foot toward your buttocks, bending your knee up off the floor (only go as far as is comfortable). Then slowly slide your foot back down until your leg is flat on the floor again.  Angel Wings: Lying on your back spread your legs to the side as far apart as you can without causing discomfort.  Hamstring Strength:  Lying on your back, push your heel against the floor with your leg straight by tightening up the muscles of your buttocks.  Repeat, but this time bend your knee to a comfortable angle, and push your heel against the floor.  You may put a pillow under the heel to make it more comfortable if necessary.   A rehabilitation program following joint replacement surgery can speed recovery and prevent re-injury in the future due to  weakened muscles. Contact your doctor or a physical therapist for more information on knee rehabilitation.    CONSTIPATION  Constipation is defined medically as fewer than three stools per week and severe constipation as less than one stool per week.  Even if you have a regular bowel pattern at home, your normal regimen is likely to be disrupted due to multiple reasons following surgery.  Combination of anesthesia, postoperative narcotics, change in appetite and fluid intake all can affect your bowels.   YOU MUST use at least one of the following options; they are listed in order of increasing strength to get the job done.  They are all available over the counter, and you may need to use some, POSSIBLY even all of these options:    Drink plenty of fluids (prune juice may be helpful) and high fiber foods Colace 100 mg by mouth twice a day  Senokot for constipation as directed and as needed Dulcolax (bisacodyl), take with full glass of water  Miralax (polyethylene glycol) once or twice a day as needed.  If you have tried all these things and are unable to have a bowel movement in the first 3-4 days after surgery call either your surgeon or your primary doctor.    If you experience loose stools or diarrhea, hold the medications until you stool forms back up.  If your symptoms do not get better within 1 week or if they get worse, check with your doctor.  If you experience "the worst abdominal pain ever" or develop nausea or vomiting, please contact the office immediately for further recommendations for treatment.   ITCHING:  If you experience itching with your medications, try taking only a single pain pill, or even half a pain pill at a time.  You can also use Benadryl over the counter for itching or also to help with sleep.   TED HOSE STOCKINGS:  Use stockings on both legs until for at least 2 weeks or as directed by physician office. They may be removed at night for sleeping.  MEDICATIONS:  See  your medication summary on the "After Visit Summary" that nursing will review with you.  You may have some  home medications which will be placed on hold until you complete the course of blood thinner medication.  It is important for you to complete the blood thinner medication as prescribed.  PRECAUTIONS:  If you experience chest pain or shortness of breath - call 911 immediately for transfer to the hospital emergency department.   If you develop a fever greater that 101 F, purulent drainage from wound, increased redness or drainage from wound, foul odor from the wound/dressing, or calf pain - CONTACT YOUR SURGEON.                                                   FOLLOW-UP APPOINTMENTS:  If you do not already have a post-op appointment, please call the office for an appointment to be seen by your surgeon.  Guidelines for how soon to be seen are listed in your "After Visit Summary", but are typically between 1-4 weeks after surgery.  OTHER INSTRUCTIONS:   Knee Replacement:  Do not place pillow under knee, focus on keeping the knee straight while resting. CPM instructions: 0-90 degrees, 2 hours in the morning, 2 hours in the afternoon, and 2 hours in the evening. Place foam block, curve side up under heel at all times except when in CPM or when walking.  DO NOT modify, tear, cut, or change the foam block in any way.  MAKE SURE YOU:  Understand these instructions.  Get help right away if you are not doing well or get worse.    Thank you for letting us be a part of your medical care team.  It is a privilege we respect greatly.  We hope these instructions will help you stay on track for a fast and full recovery!     Increase activity slowly as tolerated    Complete by:  As directed             Medication List    STOP taking these medications        acetaminophen 650 MG CR tablet  Commonly known as:  TYLENOL     HYDROcodone-acetaminophen 5-325 MG tablet  Commonly known as:  NORCO/VICODIN       TAKE these medications        aspirin EC 325 MG tablet  Take 1 tablet (325 mg total) by mouth 2 (two) times daily.     ferrous sulfate 325 (65 FE) MG tablet  Take 1 tablet (325 mg total) by mouth 3 (three) times daily after meals.     losartan-hydrochlorothiazide 50-12.5 MG tablet  Commonly known as:  HYZAAR  Take 1 tablet by mouth every morning.     methocarbamol 500 MG tablet  Commonly known as:  ROBAXIN  Take 1 tablet (500 mg total) by mouth every 6 (six) hours as needed for muscle spasms.     oxyCODONE-acetaminophen 5-325 MG tablet  Commonly known as:  ROXICET  Take 1-2 tablets by mouth every 4 (four) hours as needed for severe pain.     TRULICITY 1.5 SE/8.3TD Sopn  Generic drug:  Dulaglutide  Inject into the skin once a week.           Follow-up Information    Follow up with [facility] .   Why:  home health physical therapy   Contact information:   [phone]      Follow up  with Cynda Familia, MD. Go in 2 weeks.   Specialty:  Orthopedic Surgery   Contact information:   [address] [place] [address] [phone]       Signed: Lajean Manes 02/18/2015, 9:03 AM

## 2015-02-18 NOTE — Progress Notes (Signed)
Physical Therapy Treatment Patient Details Name: [name] MRN: [id number] DOB: 1945/05/26 Today's Date: 02/18/2015    History of Present Illness s/p L TKA    PT Comments    Pt progressing, will benefit from one more day of PT prior to D/C, needs to practice stairs on Monday as well; will see again in pm  Follow Up Recommendations  Home health PT;Supervision for mobility/OOB     Equipment Recommendations  None recommended by PT    Recommendations for Other Services       Precautions / Restrictions Precautions Precautions: Knee Required Braces or Orthoses: Knee Immobilizer - Left Knee Immobilizer - Left: Discontinue once straight leg raise with < 10 degree lag Restrictions Weight Bearing Restrictions: No    Mobility  Bed Mobility   Bed Mobility: Supine to Sit     Supine to sit: Min assist     General bed mobility comments: in chair  Transfers Overall transfer level: Needs assistance Equipment used: Rolling walker (2 wheeled) Transfers: Sit to/from Stand Sit to Stand: +2 physical assistance;Mod assist         General transfer comment: 2 attempts to stand, +2 for anterior-superior wt shift to standing; pt had much difficulty from chair  Ambulation/Gait Ambulation/Gait assistance: Min assist;Min guard;+2 safety/equipment Ambulation Distance (Feet): 55 Feet Assistive device: Rolling walker (2 wheeled) Gait Pattern/deviations: Step-to pattern;Antalgic Gait velocity: decr   General Gait Details: multi-modal cues for  sequence, RW position and step length   Stairs            Wheelchair Mobility    Modified Rankin (Stroke Patients Only)       Balance             Standing balance-Leahy Scale: Poor                      Cognition Arousal/Alertness: Awake/alert Behavior During Therapy: WFL for tasks assessed/performed Overall Cognitive Status: Within Functional Limits for tasks assessed                       Exercises      General Comments        Pertinent Vitals/Pain Pain Assessment: 0-10 Pain Score: 4  Pain Location: L knee Pain Descriptors / Indicators: Aching Pain Intervention(s): Limited activity within patient's tolerance;Monitored during session;Premedicated before session;Repositioned;Ice applied    Home Living Family/patient expects to be discharged to:: Private residence Living Arrangements: Children Available Help at Discharge: Family         Home Equipment: Shower seat;Bedside commode;Walker - 2 wheels      Prior Function Level of Independence: Independent with assistive device(s);Independent          PT Goals (current goals can now be found in the care plan section) Acute Rehab PT Goals Patient Stated Goal: be able to urinate PT Goal Formulation: With patient Time For Goal Achievement: 02/24/15 Potential to Achieve Goals: Good Progress towards PT goals: Progressing toward goals    Frequency  7X/week    PT Plan Current plan remains appropriate    Co-evaluation             End of Session Equipment Utilized During Treatment: Gait belt;Left knee immobilizer Activity Tolerance: Patient tolerated treatment well Patient left: in chair;with call bell/phone within reach;with family/visitor present     Time: 0912-0934 PT Time Calculation (min) (ACUTE ONLY): 22 min  Charges:  $Gait Training: 8-22 mins  G CodesKenyon Ana 02/18/2015, 10:28 AM

## 2015-02-18 NOTE — Progress Notes (Signed)
Subjective: 2 Days Post-Op Procedure(s) (LRB): LEFT TOTAL KNEE ARTHROPLASTY (Left) Patient reports pain as mild to left knee.  Tolerating PO's. Positive Flatus. Progressing with PT. Reports difficulty urinating, since d/c of foley yesterday. Two I&O caths since, the last producing 425ml of dark urine. Denies Sob, CP, calf pain. No f/c.   Objective: Vital signs in last 24 hours: Temp:  [97.7 F (36.5 C)-102.1 F (38.9 C)] 98.6 F (37 C) (11/06 0617) Pulse Rate:  [92-115] 97 (11/06 0617) Resp:  [15-20] 20 (11/06 0617) BP: (130-150)/(72-85) 135/72 mmHg (11/06 0617) SpO2:  [95 %-97 %] 96 % (11/06 0617)  Intake/Output from previous day: 11/05 0701 - 11/06 0700 In: 1090 [P.O.:840] Out: 925 [Urine:925] Intake/Output this shift:     Recent Labs  02/17/15 0442 02/18/15 0447  HGB 11.6* 11.9*    Recent Labs  02/17/15 0442 02/18/15 0447  WBC 12.4* 12.1*  RBC 4.10 4.09  HCT 34.8* 35.5*  PLT 262 247    Recent Labs  02/17/15 0442  NA 139  K 4.1  CL 103  CO2 27  BUN 16  CREATININE 0.96  GLUCOSE 133*  CALCIUM 8.9   No results for input(s): LABPT, INR in the last 72 hours.  Alert and oriented x3. RRR, Lungs clear, BS x4. Left Calf soft and non tender. L knee dressing C/D/I. No DVT signs. No signs of infection or compartment syndrome. LLE grossly neurovascularly intact.   Assessment/Plan: 2 Days Post-Op Procedure(s) (LRB): LEFT TOTAL KNEE ARTHROPLASTY (Left) Possible D/c home today with Family Up with PT Continue current care F/u in office and follow instructions when d/c'ed   Urinary retention: Monitor Increase PO's fluids Add Flomax  Question encouraged and anwsered  Jayline Kilburg L 02/18/2015, 8:54 AM

## 2015-02-18 NOTE — Progress Notes (Signed)
Patient still unable to void since foley catheter d/c'd. Bladder scanned for 900 cc's.  Second I&O cath done with result of 925 cc's dark, amber urine. Pt tolerated well. Will report to oncoming day RN.  Christen Bame RN

## 2015-02-19 ENCOUNTER — Encounter (HOSPITAL_COMMUNITY): Payer: Self-pay | Admitting: Specialist

## 2015-02-19 LAB — CBC
HEMATOCRIT: 32 % — AB (ref 36.0–46.0)
HEMOGLOBIN: 11.2 g/dL — AB (ref 12.0–15.0)
MCH: 30.3 pg (ref 26.0–34.0)
MCHC: 35 g/dL (ref 30.0–36.0)
MCV: 86.5 fL (ref 78.0–100.0)
Platelets: 230 10*3/uL (ref 150–400)
RBC: 3.7 MIL/uL — ABNORMAL LOW (ref 3.87–5.11)
RDW: 14.3 % (ref 11.5–15.5)
WBC: 12.1 10*3/uL — AB (ref 4.0–10.5)

## 2015-02-19 LAB — GLUCOSE, CAPILLARY
Glucose-Capillary: 156 mg/dL — ABNORMAL HIGH (ref 65–99)
Glucose-Capillary: 168 mg/dL — ABNORMAL HIGH (ref 65–99)

## 2015-02-19 MED ORDER — INSULIN ASPART 100 UNIT/ML ~~LOC~~ SOLN
0.0000 [IU] | Freq: Three times a day (TID) | SUBCUTANEOUS | Status: DC
  Administered 2015-02-19: 3 [IU] via SUBCUTANEOUS

## 2015-02-19 NOTE — Progress Notes (Signed)
Physical Therapy Treatment Patient Details Name: [name] MRN: [id number] DOB: 1945/05/11 Today's Date: 02/19/2015    History of Present Illness s/p L TKA    PT Comments    Pt is progressing toward her goals and will continue rehab at home with HHPT. She is ready for d/c today, has help at her daughter's house from children and grandchildren. Answered all patient's questions and provided an exercise handout.    Follow Up Recommendations  Home health PT;Supervision for mobility/OOB     Equipment Recommendations  None recommended by PT    Recommendations for Other Services       Precautions / Restrictions Precautions Precautions: Knee Required Braces or Orthoses: Knee Immobilizer - Left Knee Immobilizer - Left: Discontinue once straight leg raise with < 10 degree lag Restrictions Weight Bearing Restrictions: No Other Position/Activity Restrictions: WBAT    Mobility  Bed Mobility Overal bed mobility: Needs Assistance Bed Mobility: Supine to Sit     Supine to sit: Min assist;+2 for safety/equipment     General bed mobility comments: min assist with trunk elevation, assist with LLE, verbal cues to use both UEs to transfer weight into and push off the bed for trunk elevation. Pt needs increased time, effortful with transitions.   Transfers Overall transfer level: Needs assistance Equipment used: Rolling walker (2 wheeled) Transfers: Sit to/from Stand Sit to Stand: Min assist;From elevated surface         General transfer comment: multimodal cues LLE extension, increased time for scooting, steady assist for ascend and descent   Ambulation/Gait Ambulation/Gait assistance: Min guard;+2 physical assistance Ambulation Distance (Feet): 50 Feet Assistive device: Rolling walker (2 wheeled) Gait Pattern/deviations: Step-to pattern;Festinating;Antalgic;Decreased weight shift to left;Trunk flexed Gait velocity: decr   General Gait Details: min guard for safety,  recliner for safety, min verbal cues for wt shift into hands and RW progression    Stairs Stairs:  (Stair training not indicated )          Wheelchair Mobility    Modified Rankin (Stroke Patients Only)       Balance Overall balance assessment: Needs assistance           Standing balance-Leahy Scale: Poor                      Cognition Arousal/Alertness: Awake/alert Behavior During Therapy: WFL for tasks assessed/performed Overall Cognitive Status: Within Functional Limits for tasks assessed                      Exercises Total Joint Exercises Ankle Circles/Pumps: AROM;Both;15 reps Straight Leg Raises: 5 reps;Both;AROM;Strengthening    General Comments General comments (skin integrity, edema, etc.): No stair training d/t pt had change of plans to move to her daughter's house for recovery time; no steps to get into her daughter's house.       Pertinent Vitals/Pain Pain Assessment: Faces Faces Pain Scale: Hurts little more (Pain increased during amb) Pain Location: L knee Pain Descriptors / Indicators: Grimacing;Guarding;Sore Pain Intervention(s): Limited activity within patient's tolerance;Monitored during session;Repositioned;Ice applied    Home Living                      Prior Function            PT Goals (current goals can now be found in the care plan section) Acute Rehab PT Goals Patient Stated Goal: walk with less pain PT Goal Formulation: With patient Time For Goal Achievement:  02/24/15 Potential to Achieve Goals: Good Progress towards PT goals: Progressing toward goals    Frequency  7X/week    PT Plan Current plan remains appropriate    Co-evaluation             End of Session Equipment Utilized During Treatment: Gait belt;Left knee immobilizer Activity Tolerance: Patient tolerated treatment well Patient left: with call bell/phone within reach;in chair     Time: 6503-5465 PT Time Calculation (min)  (ACUTE ONLY): 31 min  Charges:  $Gait Training: 8-22 mins $Therapeutic Exercise: 8-22 mins                    G Codes:      Charde Macfarlane Carolyne Fiscal, SPT 06-Mar-2015, 12:52 PM

## 2015-02-19 NOTE — Care Management Note (Signed)
Case Management Note  Patient Details  Name: [name] MRN: [id number] Date of Birth: 07/25/1945  Subjective/Objective:      LEFT TOTAL KNEE ARTHROPLASTY               Action/Plan: Received call from [facility] and they spoke to pt to arrange visit. Pt told them she was going to stay in [place]. NCM contacted pt and got address for [facility]. Pt states her help in [place] will not be to assist with her care and she plans to stay with her Lucretia Roers [phone], on [address] [place]. NCM contacted [place] for [facility]. Arville Go will accept referral. Pt reports she has RW and 3n1 at home.   Expected Discharge Date:  02/19/2015              Expected Discharge Plan:  [place]  In-House Referral:  NA  Discharge planning Services  CM Consult  Post Acute Care Choice:  Home Health Choice offered to:  Patient  DME Arranged:  N/A DME Agency:  NA  HH Arranged:  PT HH Agency:  [place]  Status of Service:  Completed, signed off  Medicare Important Message Given:    Date Medicare IM Given:    Medicare IM give by:    Date Additional Medicare IM Given:    Additional Medicare Important Message give by:     If discussed at [place] of Stay Meetings, dates discussed:    Additional Comments:  Erenest Rasher, RN 02/19/2015, 9:36 AM

## 2015-02-19 NOTE — Care Management Important Message (Signed)
Important Message  Patient Details  Name: [name] MRN: [id number] Date of Birth: [birth year]   Medicare Important Message Given:  [facility] notification given    Camillo Flaming 02/19/2015, 10:47 AMImportant Message  Patient Details  Name: [name] MRN: [id number] Date of Birth: 22-May-1945   Medicare Important Message Given:  Yes-second notification given    Camillo Flaming 02/19/2015, 10:47 AM

## 2015-02-19 NOTE — Progress Notes (Signed)
Subjective: 3 Days Post-Op Procedure(s) (LRB): LEFT TOTAL KNEE ARTHROPLASTY (Left) Patient reports pain as mild to left knee.  Tolerating PO's. Positive flatus. Prgressing with PT. Denies SOB, CP, Calf pain. No f/c.  Objective: Vital signs in last 24 hours: Temp:  [98.1 F (36.7 C)-98.7 F (37.1 C)] 98.7 F (37.1 C) (11/07 0543) Pulse Rate:  [94-99] 94 (11/07 0543) Resp:  [19-20] 19 (11/07 0543) BP: (105-140)/(60-79) 140/79 mmHg (11/07 0543) SpO2:  [94 %-96 %] 96 % (11/07 0543)  Intake/Output from previous day: 11/06 0701 - 11/07 0700 In: 360 [P.O.:360] Out: 350 [Urine:350] Intake/Output this shift:     Recent Labs  02/17/15 0442 02/18/15 0447 02/19/15 0432  HGB 11.6* 11.9* 11.2*    Recent Labs  02/18/15 0447 02/19/15 0432  WBC 12.1* 12.1*  RBC 4.09 3.70*  HCT 35.5* 32.0*  PLT 247 230    Recent Labs  02/17/15 0442  NA 139  K 4.1  CL 103  CO2 27  BUN 16  CREATININE 0.96  GLUCOSE 133*  CALCIUM 8.9   No results for input(s): LABPT, INR in the last 72 hours.  Alert and oriented x3. RRR, Lungs clear, BS x4. Left Calf soft and non tender. L knee dressing C/D/I. No DVT signs. No signs of infection or compartment syndrome. LLE grossly neurovascularly intact.   Assessment/Plan: 3 Days Post-Op Procedure(s) (LRB): LEFT TOTAL KNEE ARTHROPLASTY (Left) D/c home today Home Health Follow instructions F/u in office Up with PT  Obesity: Diet modification Exceercise  Kathleen Tamm L 02/19/2015, 7:43 AM

## 2015-02-19 NOTE — Progress Notes (Signed)
Pt's vitals wnl, tolerating diet and pain is under controlled. Pt has all DME at home and is ok to be discharged per Physical Therapy. Discuss discharge instructions with patient. Patient had no questions nor concerns. Discharged to home

## 2015-07-19 ENCOUNTER — Encounter: Payer: Self-pay | Admitting: Internal Medicine

## 2016-01-15 ENCOUNTER — Other Ambulatory Visit: Payer: Self-pay | Admitting: Family Medicine

## 2016-01-15 DIAGNOSIS — Z1231 Encounter for screening mammogram for malignant neoplasm of breast: Secondary | ICD-10-CM

## 2016-01-25 ENCOUNTER — Ambulatory Visit
Admission: RE | Admit: 2016-01-25 | Discharge: 2016-01-25 | Disposition: A | Discharge status: Home / Self Care | Payer: Medicare Other | Source: Ambulatory Visit | Attending: Family Medicine | Admitting: Family Medicine

## 2016-01-25 DIAGNOSIS — Z1231 Encounter for screening mammogram for malignant neoplasm of breast: Secondary | ICD-10-CM

## 2016-12-16 ENCOUNTER — Other Ambulatory Visit: Payer: Self-pay | Admitting: Family Medicine

## 2016-12-16 DIAGNOSIS — Z1231 Encounter for screening mammogram for malignant neoplasm of breast: Secondary | ICD-10-CM

## 2017-01-26 ENCOUNTER — Ambulatory Visit: Payer: Medicare Other

## 2017-01-27 ENCOUNTER — Ambulatory Visit
Admission: RE | Admit: 2017-01-27 | Discharge: 2017-01-27 | Disposition: A | Discharge status: Home / Self Care | Payer: Medicare Other | Source: Ambulatory Visit | Attending: Family Medicine | Admitting: Family Medicine

## 2017-01-27 DIAGNOSIS — Z1231 Encounter for screening mammogram for malignant neoplasm of breast: Secondary | ICD-10-CM

## 2017-07-07 ENCOUNTER — Other Ambulatory Visit: Payer: Self-pay | Admitting: Gastroenterology

## 2017-07-28 ENCOUNTER — Other Ambulatory Visit: Payer: Self-pay

## 2017-07-28 ENCOUNTER — Encounter (HOSPITAL_COMMUNITY): Payer: Self-pay | Admitting: Emergency Medicine

## 2017-07-29 NOTE — Anesthesia Preprocedure Evaluation (Addendum)
Anesthesia Evaluation  Patient identified by MRN, date of birth, ID band Patient awake    Reviewed: Allergy & Precautions, NPO status , Patient's Chart, lab work & pertinent test results  History of Anesthesia Complications Negative for: history of anesthetic complications  Airway Mallampati: II  TM Distance: >3 FB Neck ROM: Full    Dental  (+) Dental Advisory Given   Pulmonary sleep apnea (does not use CPAP) , former smoker (quit 1974),    breath sounds clear to auscultation       Cardiovascular hypertension, Pt. on medications (-) angina Rhythm:Regular Rate:Normal  2011 ECHO: EF 60-65%, valves OK   Neuro/Psych negative neurological ROS     GI/Hepatic Neg liver ROS, GERD  Controlled,  Endo/Other  diabetes (diet controlled)Morbid obesity  Renal/GU negative Renal ROS     Musculoskeletal   Abdominal (+) + obese,   Peds  Hematology   Anesthesia Other Findings   Reproductive/Obstetrics                            Anesthesia Physical Anesthesia Plan  ASA: II  Anesthesia Plan: MAC   Post-op Pain Management:    Induction:   PONV Risk Score and Plan: 2 and Treatment may vary due to age or medical condition  Airway Management Planned: Natural Airway and Nasal Cannula  Additional Equipment:   Intra-op Plan:   Post-operative Plan:   Informed Consent: I have reviewed the patients History and Physical, chart, labs and discussed the procedure including the risks, benefits and alternatives for the proposed anesthesia with the patient or authorized representative who has indicated his/her understanding and acceptance.   Dental advisory given  Plan Discussed with: CRNA and Surgeon  Anesthesia Plan Comments: (Plan routine monitors, MAC)        Anesthesia Quick Evaluation

## 2017-07-30 ENCOUNTER — Ambulatory Visit (HOSPITAL_COMMUNITY): Payer: Medicare Other | Admitting: Anesthesiology

## 2017-07-30 ENCOUNTER — Other Ambulatory Visit: Payer: Self-pay

## 2017-07-30 ENCOUNTER — Encounter (HOSPITAL_COMMUNITY)
Admission: RE | Disposition: A | Discharge status: Home / Self Care | Payer: Self-pay | Source: Ambulatory Visit | Attending: Gastroenterology

## 2017-07-30 ENCOUNTER — Ambulatory Visit (HOSPITAL_COMMUNITY)
Admission: RE | Admit: 2017-07-30 | Discharge: 2017-07-30 | Disposition: A | Discharge status: Home / Self Care | Payer: Medicare Other | Source: Ambulatory Visit | Attending: Gastroenterology | Admitting: Gastroenterology

## 2017-07-30 ENCOUNTER — Encounter (HOSPITAL_COMMUNITY): Payer: Self-pay

## 2017-07-30 DIAGNOSIS — E119 Type 2 diabetes mellitus without complications: Secondary | ICD-10-CM | POA: Insufficient documentation

## 2017-07-30 DIAGNOSIS — Z96652 Presence of left artificial knee joint: Secondary | ICD-10-CM | POA: Insufficient documentation

## 2017-07-30 DIAGNOSIS — Z1211 Encounter for screening for malignant neoplasm of colon: Secondary | ICD-10-CM | POA: Diagnosis present

## 2017-07-30 DIAGNOSIS — Z86010 Personal history of colonic polyps: Secondary | ICD-10-CM | POA: Diagnosis not present

## 2017-07-30 DIAGNOSIS — K5730 Diverticulosis of large intestine without perforation or abscess without bleeding: Secondary | ICD-10-CM | POA: Insufficient documentation

## 2017-07-30 DIAGNOSIS — I10 Essential (primary) hypertension: Secondary | ICD-10-CM | POA: Diagnosis not present

## 2017-07-30 DIAGNOSIS — Z87891 Personal history of nicotine dependence: Secondary | ICD-10-CM | POA: Insufficient documentation

## 2017-07-30 HISTORY — PX: COLONOSCOPY WITH PROPOFOL: SHX5780

## 2017-07-30 LAB — GLUCOSE, CAPILLARY: Glucose-Capillary: 109 mg/dL — ABNORMAL HIGH (ref 65–99)

## 2017-07-30 SURGERY — COLONOSCOPY WITH PROPOFOL
Anesthesia: Monitor Anesthesia Care

## 2017-07-30 MED ORDER — LACTATED RINGERS IV SOLN
INTRAVENOUS | Status: DC
  Administered 2017-07-30: 1000 mL via INTRAVENOUS

## 2017-07-30 MED ORDER — SODIUM CHLORIDE 0.9 % IV SOLN: INTRAVENOUS | Status: DC

## 2017-07-30 MED ORDER — PROPOFOL 10 MG/ML IV BOLUS
INTRAVENOUS | Status: AC
  Filled 2017-07-30: qty 40

## 2017-07-30 MED ORDER — PROPOFOL 10 MG/ML IV BOLUS
INTRAVENOUS | Status: DC | PRN
  Administered 2017-07-30: 20 mg via INTRAVENOUS
  Administered 2017-07-30: 10 mg via INTRAVENOUS

## 2017-07-30 MED ORDER — PROPOFOL 10 MG/ML IV BOLUS
INTRAVENOUS | Status: AC
  Filled 2017-07-30: qty 20

## 2017-07-30 MED ORDER — PROPOFOL 500 MG/50ML IV EMUL
INTRAVENOUS | Status: DC | PRN
  Administered 2017-07-30: 110 ug/kg/min via INTRAVENOUS

## 2017-07-30 MED ORDER — LIDOCAINE 2% (20 MG/ML) 5 ML SYRINGE
INTRAMUSCULAR | Status: DC | PRN
  Administered 2017-07-30: 100 mg via INTRAVENOUS

## 2017-07-30 SURGICAL SUPPLY — 22 items

## 2017-07-30 NOTE — Anesthesia Procedure Notes (Signed)
Procedure Name: MAC Date/Time: 07/30/2017 7:25 AM Performed by: Dione Booze, CRNA Pre-anesthesia Checklist: Patient identified, Emergency Drugs available, Suction available and Patient being monitored Patient Re-evaluated:Patient Re-evaluated prior to induction Oxygen Delivery Method: Simple face mask Placement Confirmation: positive ETCO2

## 2017-07-30 NOTE — Transfer of Care (Signed)
Immediate Anesthesia Transfer of Care Note  Patient: [name]  Procedure(s) Performed: COLONOSCOPY WITH PROPOFOL (N/A )  Patient Location: PACU and Endoscopy Unit  Anesthesia Type:MAC  Level of Consciousness: awake, drowsy and patient cooperative  Airway & Oxygen Therapy: Patient Spontanous Breathing and Patient connected to face mask oxygen  Post-op Assessment: Report given to RN and Post -op Vital signs reviewed and stable  Post vital signs: Reviewed and stable  Last Vitals:  Vitals Value Taken Time  BP    Temp    Pulse    Resp    SpO2      Last Pain:  Vitals:   07/30/17 0629  TempSrc: Oral  PainSc: 5          Complications: No apparent anesthesia complications

## 2017-07-30 NOTE — Discharge Instructions (Signed)

## 2017-07-30 NOTE — Anesthesia Postprocedure Evaluation (Signed)
Anesthesia Post Note  Patient: [name]  Procedure(s) Performed: COLONOSCOPY WITH PROPOFOL (N/A )     Patient location during evaluation: Endoscopy Anesthesia Type: MAC Level of consciousness: awake and alert, oriented and patient cooperative Pain management: pain level controlled Vital Signs Assessment: post-procedure vital signs reviewed and stable Respiratory status: spontaneous breathing, nonlabored ventilation and respiratory function stable Cardiovascular status: blood pressure returned to baseline and stable Postop Assessment: no apparent nausea or vomiting Anesthetic complications: no    Last Vitals:  Vitals:   07/30/17 0751 07/30/17 0800  BP: 135/60 (!) 142/76  Pulse: 68 63  Resp: 15 17  Temp: 36.6 C   SpO2: 100% 97%    Last Pain:  Vitals:   07/30/17 0800  TempSrc:   PainSc: 0-No pain                 Sanah Kraska,E. Giacomo Valone

## 2017-07-30 NOTE — H&P (Signed)
[name] is an 72 y.o. female.   Chief Complaint: Colorectal cancer screening. HPI: 72 year old black female here for colorectal cancer screening. See office notes for details.  Past Medical History:  Diagnosis Date  . Arthritis    osteoarthritis left knee, right shoulder "locks" occ.  . Diabetes mellitus without complication ([place])   . H/O seasonal allergies   . Hypertension   . Sleep apnea/Morbid obesity    no cpap-continues to be evaluated   Past Surgical History:  Procedure Laterality Date  . ABDOMINAL HYSTERECTOMY    . BACK SURGERY     lumbar fusion  . CATARACT EXTRACTION, BILATERAL    . CHOLECYSTECTOMY    . EXPLORATORY LAPAROTOMY     ovaries freed from intestines.  Marland Kitchen FOOT SURGERY     retained hardware.  Marland Kitchen KNEE ARTHROSCOPY Right    scope surgery  . TONSILLECTOMY    . TOTAL KNEE ARTHROPLASTY Left 02/16/2015   Procedure: LEFT TOTAL KNEE ARTHROPLASTY;  Surgeon: Sydnee Cabal, MD;  Location: [facility];  Service: Orthopedics;  Laterality: Left;  . WRIST SURGERY Left    x2 "cysts"   History reviewed. No pertinent family history. Social History:  reports that she quit smoking about 44 years ago. Her smoking use included cigarettes. She has a 5.00 pack-year smoking history. She has never used smokeless tobacco. She reports that she drinks alcohol. She reports that she does not use drugs.  Allergies:  Allergies  Allergen Reactions  . Prednisone Shortness Of Breath  . Metformin Nausea And Vomiting    Too much diarrhea    Medications Prior to Admission  Medication Sig Dispense Refill  . lithium carbonate 150 MG capsule Take 1 capsule by mouth 2 (two) times a week. Monday and Friday    . losartan-hydrochlorothiazide (HYZAAR) 50-12.5 MG tablet Take 1 tablet by mouth every morning.     Results for orders placed or performed during the hospital encounter of 07/30/17 (from the past 48 hour(s))  Glucose, capillary     Status: Abnormal   Collection Time: 07/30/17  6:50 AM   Result Value Ref Range   Glucose-Capillary 109 (H) 65 - 99 mg/dL   No results found.  Review of Systems  Constitutional: Negative.   HENT: Negative.   Eyes: Negative.   Respiratory: Negative.   Cardiovascular: Negative.   Gastrointestinal: Negative.   Genitourinary: Negative.   Musculoskeletal: Positive for joint pain.  Skin: Negative.   Neurological: Negative.   Endo/Heme/Allergies: Negative.   Psychiatric/Behavioral: The patient is nervous/anxious and has insomnia.    Blood pressure (!) 164/81, pulse (!) 59, temperature 97.7 F (36.5 C), temperature source Oral, resp. rate 20, height 5\' 8"  (1.727 m), weight 133.8 kg (295 lb), SpO2 99 %. Physical Exam  Constitutional: She is oriented to person, place, and time. She appears well-developed and well-nourished.  HENT:  Head: Normocephalic and atraumatic.  Eyes: Pupils are equal, round, and reactive to light. Conjunctivae and EOM are normal.  Neck: Normal range of motion. Neck supple.  Cardiovascular: Normal rate, regular rhythm and normal heart sounds.  Respiratory: Effort normal and breath sounds normal.  GI: Soft. Bowel sounds are normal.  Musculoskeletal: Normal range of motion.  Neurological: She is alert and oriented to person, place, and time.  Skin: Skin is warm and dry.  Psychiatric: She has a normal mood and affect. Her behavior is normal. Judgment and thought content normal.    Assessment/Plan Colorectal cancer screening: proceeed with a colonoscopy at this time.  Margery Szostak, MD 07/30/2017, 7:16 AM

## 2017-07-30 NOTE — Op Note (Signed)
[facility] Patient Name: [name] Procedure Date: 07/30/2017 MRN: [id number] Attending MD: Juanita Craver , MD Date of Birth: Aug 13, 1945 CSN: [id number] Age: 72 Admit Type: Inpatient Procedure:                Screening colonoscopy. Indications:              Personal history of colonic polyps; CRC screening                            for colorectal malignant neoplasm. Providers:                Juanita Craver, MD, Angus Seller, RN, Alan Mulder,                            Technician, Dione Booze, CRNA Referring MD:             Eligah East, DO Medicines:                Monitored anesthesia Care. Complications:            No immediate complications. Estimated Blood Loss:     Estimated blood loss: none. Procedure:                Pre-anesthesia assessment: - Prior to the                            procedure, a history and physical was performed,                            and patient medications and allergies were                            reviewed. The patient's tolerance of previous                            anesthesia was also reviewed. The risks and                            benefits of the procedure and the sedation options                            and risks were discussed with the patient. All                            questions were answered, and informed consent was                            obtained. Prior anticoagulants: The patient has                            taken no previous anticoagulant or antiplatelet                            agents. ASA Grade Assessment: III - A patient with  severe systemic disease. After reviewing the risks                            and benefits, the patient was deemed in                            satisfactory condition to undergo the procedure.                            After obtaining informed consent, the colonoscope                            was passed under direct vision. Throughout the                             procedure, the patient's blood pressure, pulse, and                            oxygen saturations were monitored continuously. The                            EC-[id number] ([id number]) scope was introduced through                            the anus and advanced to the the cecum, identified                            by appendiceal orifice and ileocecal valve. The                            colonoscopy was performed without difficulty. The                            patient tolerated the procedure well. The quality                            of the bowel preparation was adequate. The                            ileocecal valve, the appendiceal orifice and the                            rectum were photographed. The bowel preparation                            used was GoLYTELY. Scope In: 7:29:46 AM Scope Out: 7:44:15 AM Scope Withdrawal Time: 0 hours 4 minutes 37 seconds  Total Procedure Duration: 0 hours 14 minutes 29 seconds  Findings:      Multiple large-mouthed diverticula were found in the transverse and       ascending colon.      Small internal hemorrhoids were noted on retroflexion.      The exam was otherwise without abnormality. Impression:               -  Diverticulosis in the transverse and ascending                            colon.                           - Small internal hemorrhoids.                           - The examination was otherwise normal.                           - No specimens collected. Moderate Sedation:      MAC used. Recommendation:           - High fiber diet with augmented water consumption                            daily.                           - Continue present medications.                           - Repeat colonoscopy in 5 years for surveillance.                           - Return to GI office PRN.                           - If the patient has any abnormal GI symptoms in                            the interim, she has  been advised to call the                            office ASAP for further recommendations. Procedure Code(s):        --- Professional ---                           [phone], Colonoscopy, flexible; diagnostic, including                            collection of specimen(s) by brushing or washing,                            when performed (separate procedure) Diagnosis Code(s):        --- Professional ---                           K57.30, Diverticulosis of large intestine without                            perforation or abscess without bleeding                           Z86.010, Personal history of colonic polyps  Z12.11, Encounter for screening for malignant                            neoplasm of colon CPT copyright 2017 American Medical Association. All rights reserved. The codes documented in this report are preliminary and upon coder review may  be revised to meet current compliance requirements. Juanita Craver, MD Juanita Craver, MD 07/30/2017 7:55:03 AM This report has been signed electronically. Number of Addenda: 0

## 2017-07-31 ENCOUNTER — Encounter (HOSPITAL_COMMUNITY): Payer: Self-pay | Admitting: Gastroenterology

## 2018-01-11 ENCOUNTER — Other Ambulatory Visit (HOSPITAL_COMMUNITY): Payer: Self-pay | Admitting: Orthopedic Surgery

## 2018-01-11 DIAGNOSIS — Z96652 Presence of left artificial knee joint: Secondary | ICD-10-CM

## 2018-01-22 ENCOUNTER — Encounter (HOSPITAL_COMMUNITY)
Admission: RE | Admit: 2018-01-22 | Discharge: 2018-01-22 | Disposition: A | Discharge status: Home / Self Care | Payer: Medicare Other | Source: Ambulatory Visit | Attending: Orthopedic Surgery | Admitting: Orthopedic Surgery

## 2018-01-22 ENCOUNTER — Ambulatory Visit (HOSPITAL_COMMUNITY)
Admission: RE | Admit: 2018-01-22 | Discharge: 2018-01-22 | Disposition: A | Discharge status: Home / Self Care | Payer: Medicare Other | Source: Ambulatory Visit | Attending: Orthopedic Surgery | Admitting: Orthopedic Surgery

## 2018-01-22 DIAGNOSIS — Z96652 Presence of left artificial knee joint: Secondary | ICD-10-CM | POA: Diagnosis present

## 2018-01-22 DIAGNOSIS — M238X1 Other internal derangements of right knee: Secondary | ICD-10-CM | POA: Diagnosis not present

## 2018-01-22 DIAGNOSIS — R6889 Other general symptoms and signs: Secondary | ICD-10-CM | POA: Insufficient documentation

## 2018-01-22 MED ORDER — TECHNETIUM TC 99M MEDRONATE IV KIT
20.0000 | PACK | Freq: Once | INTRAVENOUS | Status: AC | PRN
  Administered 2018-01-22: 21.3 via INTRAVENOUS

## 2018-03-09 ENCOUNTER — Other Ambulatory Visit: Payer: Self-pay | Admitting: Family Medicine

## 2018-03-09 DIAGNOSIS — Z1231 Encounter for screening mammogram for malignant neoplasm of breast: Secondary | ICD-10-CM

## 2018-04-21 ENCOUNTER — Ambulatory Visit
Admission: RE | Admit: 2018-04-21 | Discharge: 2018-04-21 | Disposition: A | Discharge status: Home / Self Care | Payer: Medicare Other | Source: Ambulatory Visit | Attending: Family Medicine | Admitting: Family Medicine

## 2018-04-21 DIAGNOSIS — Z1231 Encounter for screening mammogram for malignant neoplasm of breast: Secondary | ICD-10-CM

## 2019-03-08 ENCOUNTER — Ambulatory Visit: Payer: Medicare Other | Admitting: Orthopaedic Surgery

## 2019-03-30 ENCOUNTER — Other Ambulatory Visit: Payer: Self-pay | Admitting: Urgent Care

## 2019-03-30 DIAGNOSIS — Z1231 Encounter for screening mammogram for malignant neoplasm of breast: Secondary | ICD-10-CM

## 2019-04-13 IMAGING — MG DIGITAL SCREENING BILATERAL MAMMOGRAM WITH CAD
5 series · 5 of 5 positions shown · non-contrast
Comparison: Previous exam(s).

CLINICAL DATA: Screening.

EXAM:
DIGITAL SCREENING BILATERAL MAMMOGRAM WITH CAD

[R MLO (1 of 2)]
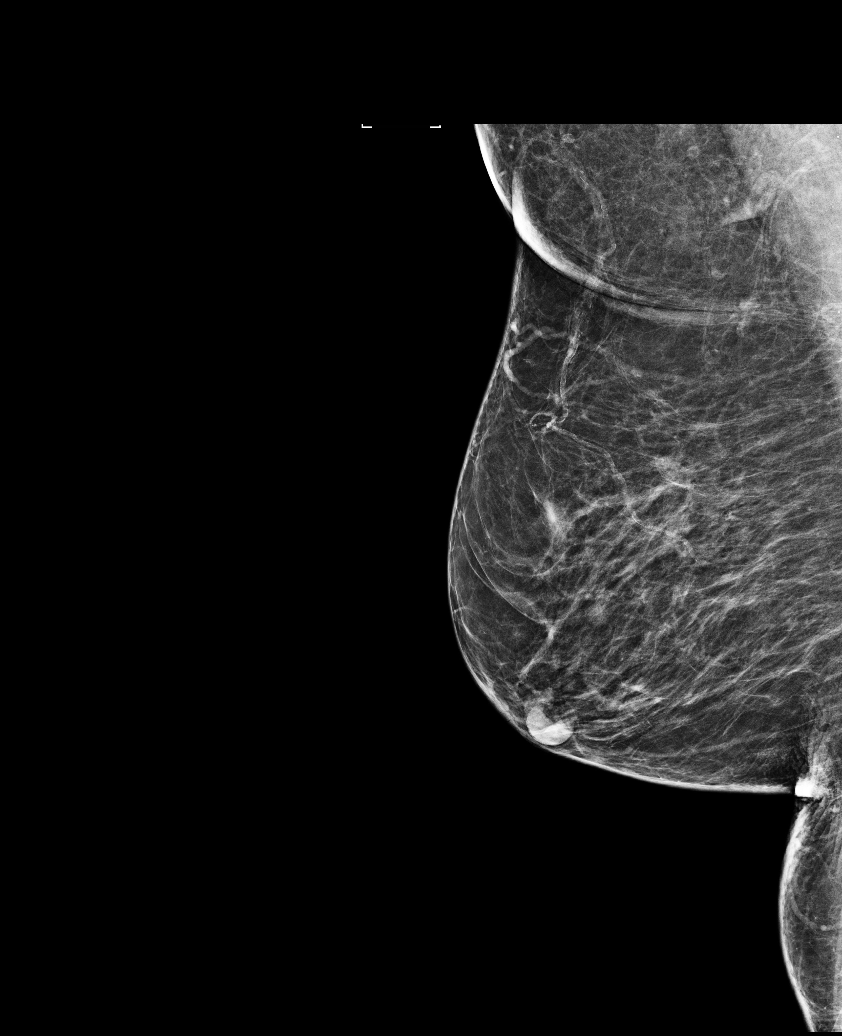

[R CC]
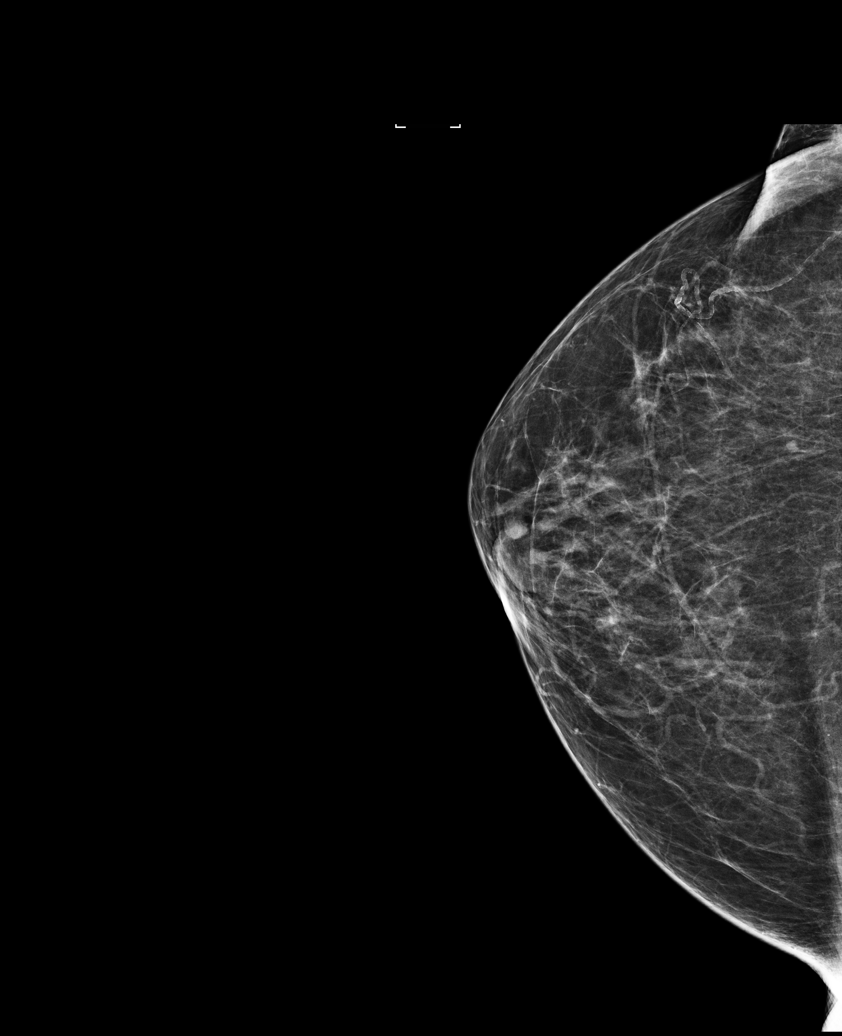

[R MLO (2 of 2)]
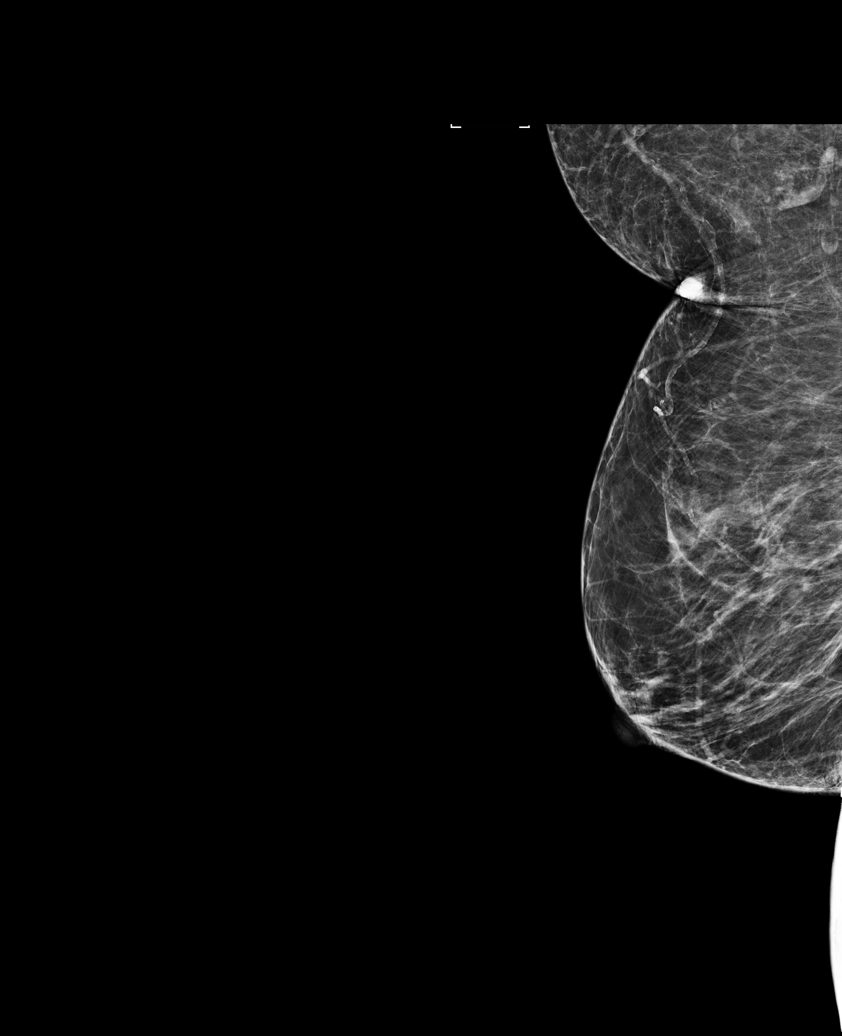

[L MLO]
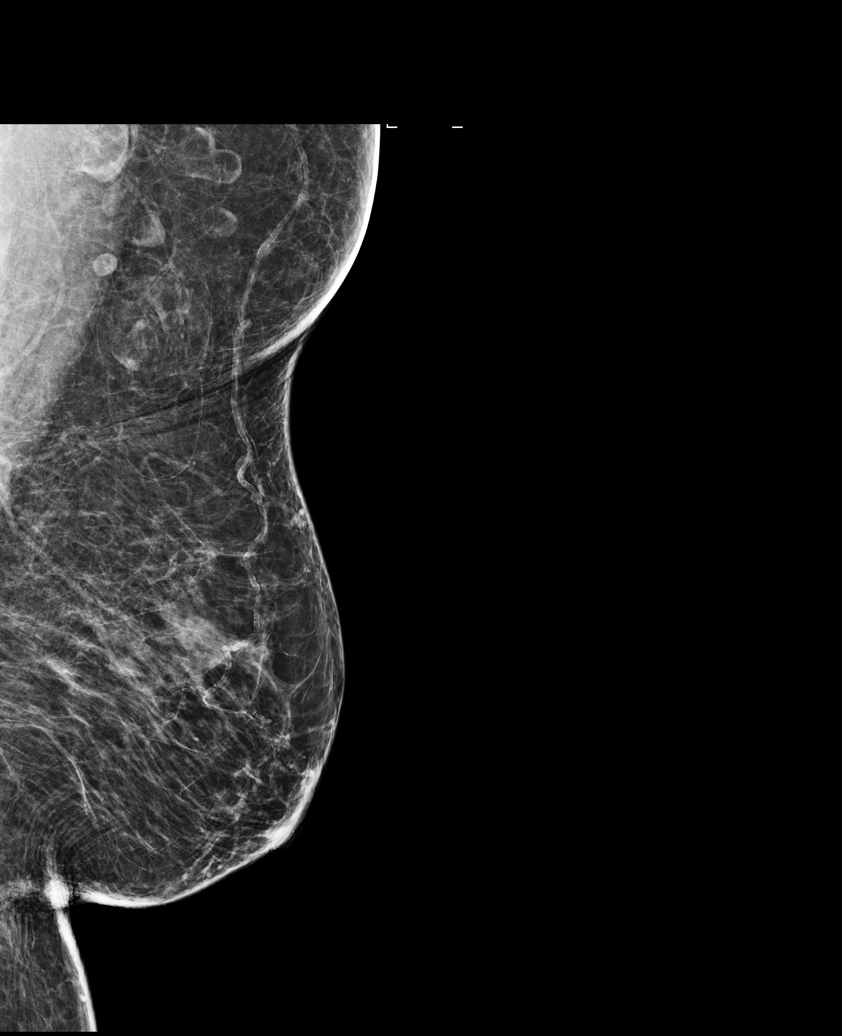

[L CC]
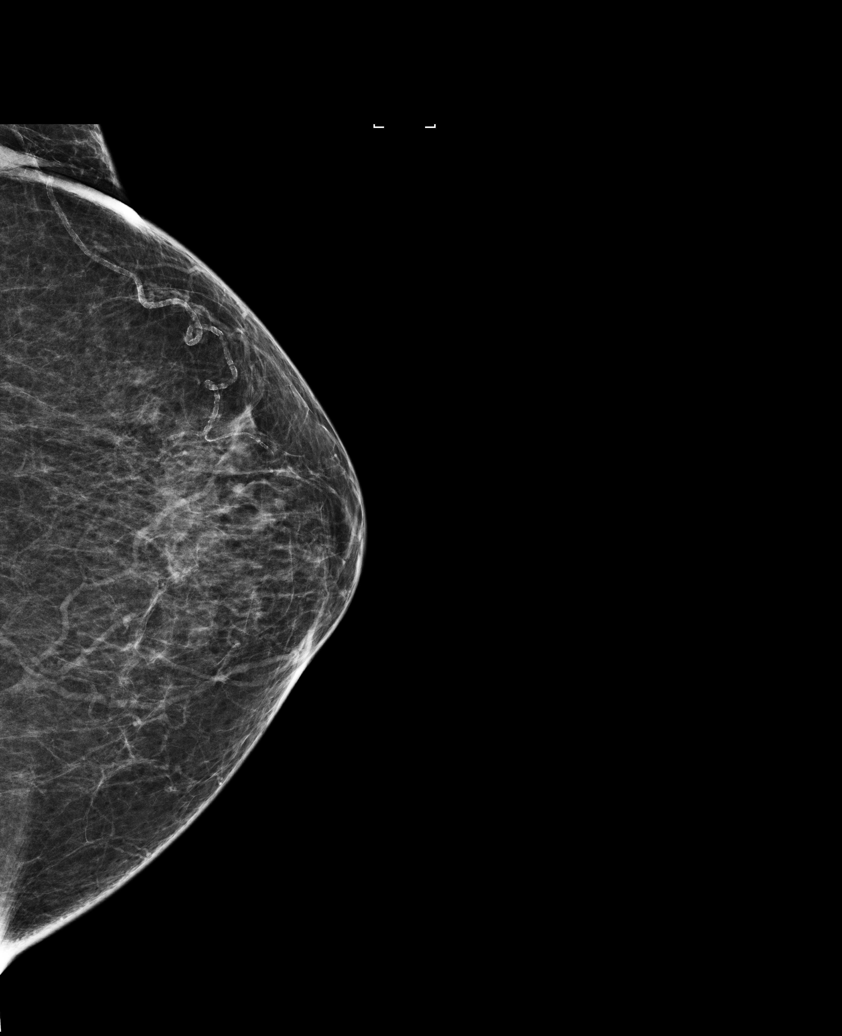

[5 of 5 positions shown; findings below may reference images not displayed]

ACR Breast Density Category b: There are scattered areas of
fibroglandular density.
FINDINGS: There are no findings suspicious for malignancy. Images were
processed with CAD.
IMPRESSION: No mammographic evidence of malignancy. A result letter of this
screening mammogram will be mailed directly to the patient.

RECOMMENDATION:
Screening mammogram in one year. (Code:AS-G-LCT)

BI-RADS CATEGORY  1: Negative.

## 2019-04-19 ENCOUNTER — Ambulatory Visit: Payer: Self-pay

## 2019-04-19 ENCOUNTER — Encounter: Payer: Self-pay | Admitting: Orthopaedic Surgery

## 2019-04-19 ENCOUNTER — Other Ambulatory Visit: Payer: Self-pay

## 2019-04-19 ENCOUNTER — Ambulatory Visit (INDEPENDENT_AMBULATORY_CARE_PROVIDER_SITE_OTHER): Payer: Medicare PPO | Admitting: Orthopaedic Surgery

## 2019-04-19 DIAGNOSIS — M5441 Lumbago with sciatica, right side: Secondary | ICD-10-CM

## 2019-04-19 DIAGNOSIS — M25562 Pain in left knee: Secondary | ICD-10-CM

## 2019-04-19 DIAGNOSIS — Z6841 Body Mass Index (BMI) 40.0 and over, adult: Secondary | ICD-10-CM

## 2019-04-19 DIAGNOSIS — M25561 Pain in right knee: Secondary | ICD-10-CM

## 2019-04-19 DIAGNOSIS — M25551 Pain in right hip: Secondary | ICD-10-CM | POA: Diagnosis not present

## 2019-04-19 DIAGNOSIS — G8929 Other chronic pain: Secondary | ICD-10-CM

## 2019-04-19 DIAGNOSIS — M25552 Pain in left hip: Secondary | ICD-10-CM

## 2019-04-19 MED ORDER — METHOCARBAMOL 500 MG PO TABS: 500.0000 mg | ORAL_TABLET | Freq: Two times a day (BID) | ORAL | 0 refills | Status: DC | PRN

## 2019-04-19 NOTE — Progress Notes (Signed)
Office Visit Note   Patient: [name]           Date of Birth: 05-25-1945           MRN: [id number] Visit Date: 04/19/2019              Requested by: Eligah East, DO No address on file PCP: Eligah East, DO   Assessment & Plan: Visit Diagnoses:  1. Chronic pain of both knees   2. Bilateral hip pain   3. Chronic bilateral low back pain with right-sided sciatica   4. Body mass index 40.0-44.9, adult ([place])   5. Morbid obesity ([place])     Plan: Impression is #1 right knee end-stage degenerative joint disease.  #2 questionable right-sided lumbar radiculopathy.  In regards to the bilateral thigh pain that the patient is experiencing, I do believe this is likely referred from her knees and overall deconditioning and lack of quadricep strength.  I will call in a Sterapred taper muscle relaxer.  We will also start her in physical therapy work on strengthening exercises.  In the meantime, she is going to make all efforts at weight loss in order to qualify for a total knee replacement.  She is currently 5 foot 7 inches.  She will need to get down to 250 pounds before proceeding with surgical intervention.  We will also need to make sure her hemoglobin A1c is under 7.8 before proceeding with surgical intervention.  Follow-up with [place] as needed.  The patient meets the AMA guidelines for Morbid (severe) obesity with a BMI > 40.0 and I have recommended weight loss.  Follow-Up Instructions: Return if symptoms worsen or fail to improve.   Orders:  Orders Placed This Encounter  Procedures  . XR KNEE 3 VIEW RIGHT  . XR KNEE 3 VIEW LEFT  . XR Lumbar Spine 2-3 Views  . XR HIPS BILAT W OR W/O PELVIS 3-4 VIEWS  . Ambulatory referral to Physical Therapy   Meds ordered this encounter  Medications  . methocarbamol (ROBAXIN) 500 MG tablet    Sig: Take 1 tablet (500 mg total) by mouth 2 (two) times daily as needed.    Dispense:  20 tablet    Refill:  0      Procedures: No procedures  performed   Clinical Data: No additional findings.   Subjective: Chief Complaint  Patient presents with  . Right Knee - Pain, Edema  . Left Knee - Pain, Edema    HPI patient is a pleasant 74 year old female who comes in today with bilateral knee pain.  She is 4 years status post left total knee replacement by Dr. Theda Sers at [facility].  She notes that she never really noticed improvement of symptoms following surgical intervention.  She was seen back in his office where she was told that she needed more time to heal.  He wanted to start her on steroids but she was unable to take these due to recent cataract surgery.  The pain she has is not only to the anterior knee but is occasionally into the groin and anterior thigh.  Worse going from a seated to standing position and when she is up for a long period of time.  In regards to the right lower extremity, the pain starts in her buttocks and goes all the way to the ankle.  She does endorse pain to the groin and anterior thigh.  She does get relief when sitting down.  She had a previous cortisone injection  to the right knee by Dr. Theda Sers about a year ago which did help quite a bit but only lasted for about a week.  No previous cortisone injections to either hip.  She does note a history of lower back fusion by Dr. Patrice Paradise about 12 to 13 years ago.  Review of Systems as detailed in HPI.  All others reviewed and are negative.   Objective: Vital Signs: There were no vitals taken for this visit.  Physical Exam well-developed well-nourished female no acute distress.  Alert oriented x3.  Ortho Exam examination of her right knee shows moderate medial and lateral joint line tenderness.  Moderate patellofemoral crepitus.  Ligaments are stable.  Left knee is stable valgus and varus stress no joint line tenderness.  Negative logroll negative FADIR both sides.  Minimally positive straight leg raise on the right.  No pain with lumbar flexion or extension.  No  focal weakness.  She does have increased pain with hip flexion.  She is neurovascular intact distally.  Specialty Comments:  No specialty comments available.  Imaging: XR HIPS BILAT W OR W/O PELVIS 3-4 VIEWS  Result Date: 04/19/2019 Mild to moderate joint space narrowing both sides  XR KNEE 3 VIEW LEFT  Result Date: 04/19/2019 Well-seated prosthesis without complication  XR KNEE 3 VIEW RIGHT  Result Date: 04/19/2019 Moderate tricompartmental degenerative changes with particular spurring  XR Lumbar Spine 2-3 Views  Result Date: 04/19/2019 Previous fusion [id number] without complication    PMFS History: Patient Active Problem List   Diagnosis Date Noted  . 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 Osteoarthritis of left knee 02/16/2015  . OBESITY 10/25/2007  . HIATAL HERNIA 10/25/2007  . ABDOMINAL PAIN-EPIGASTRIC 10/25/2007   Past Medical History:  Diagnosis Date  . Arthritis    osteoarthritis left knee, right shoulder "locks" occ.  . Diabetes mellitus without complication ([place])   . H/O seasonal allergies   . Hypertension   . Sleep apnea    no cpap-continues to be evaluated    History reviewed. No pertinent family history.  Past Surgical History:  Procedure Laterality Date  . ABDOMINAL HYSTERECTOMY    . BACK SURGERY     lumbar fusion  . CATARACT EXTRACTION, BILATERAL    . CHOLECYSTECTOMY    . COLONOSCOPY WITH PROPOFOL N/A 07/30/2017   Procedure: COLONOSCOPY WITH PROPOFOL;  Surgeon: Juanita Craver, MD;  Location: [facility];  Service: Endoscopy;  Laterality: N/A;  . EXPLORATORY LAPAROTOMY     ovaries freed from intestines.  Marland Kitchen FOOT SURGERY     retained hardware.  Marland Kitchen KNEE ARTHROSCOPY Right    scope surgery  . TONSILLECTOMY    . TOTAL KNEE ARTHROPLASTY Left 02/16/2015   Procedure: LEFT TOTAL KNEE ARTHROPLASTY;  Surgeon: Sydnee Cabal, MD;  Location: [facility];  Service: Orthopedics;  Laterality: Left;  . WRIST SURGERY Left    x2 "cysts"   Social History   Occupational History   . Not on file  Tobacco Use  . Smoking status: Former Smoker    Packs/day: 0.50    Years: 10.00    Pack years: 5.00    Types: Cigarettes    Quit date: 02/07/1973    Years since quitting: 46.2  . Smokeless tobacco: Never Used  Substance and Sexual Activity  . Alcohol use: Yes    Comment: rare occ.  . Drug use: No  . Sexual activity: Not on file

## 2019-05-09 ENCOUNTER — Encounter: Payer: Self-pay | Admitting: Physical Therapy

## 2019-05-09 ENCOUNTER — Ambulatory Visit: Payer: Medicare PPO | Admitting: Physical Therapy

## 2019-05-09 ENCOUNTER — Other Ambulatory Visit: Payer: Self-pay

## 2019-05-09 DIAGNOSIS — M25562 Pain in left knee: Secondary | ICD-10-CM

## 2019-05-09 DIAGNOSIS — M545 Low back pain, unspecified: Secondary | ICD-10-CM

## 2019-05-09 DIAGNOSIS — M6281 Muscle weakness (generalized): Secondary | ICD-10-CM

## 2019-05-09 DIAGNOSIS — M25561 Pain in right knee: Secondary | ICD-10-CM | POA: Diagnosis not present

## 2019-05-09 DIAGNOSIS — G8929 Other chronic pain: Secondary | ICD-10-CM

## 2019-05-09 NOTE — Therapy (Addendum)
[facility] [address] [place], [place], [address] Phone: [phone]   Fax:  [phone]  Physical Therapy Evaluation/Discharge addendum PHYSICAL THERAPY DISCHARGE SUMMARY  Visits from Start of Care: 1  Current functional level related to goals / functional outcomes: See below   Remaining deficits: See below   Education / Equipment: HEP Plan: Patient agrees to discharge.  Patient goals were not met. Patient is being discharged due to not returning since the last visit.  ?????     Elsie Ra, PT, DPT 08/08/19 8:41 AM    Patient Details  Name: DONNAMARIA SHANDS MRN: [id number] Date of Birth: Jul 24, 1945 Referring Provider (PT): Aundra Dubin, [place]   Encounter Date: 05/09/2019  PT End of Session - 05/09/19 2003    Visit Number  1    Number of Visits  12    Date for PT Re-Evaluation  06/20/19    Authorization Type  [employer] MCR    PT Start Time  1315    PT Stop Time  1400    PT Time Calculation (min)  45 min    Activity Tolerance  Patient tolerated treatment well    Behavior During Therapy  [facility] for tasks assessed/performed       Past Medical History:  Diagnosis Date  . Arthritis    osteoarthritis left knee, right shoulder "locks" occ.  . Diabetes mellitus without complication ([place])   . H/O seasonal allergies   . Hypertension   . Sleep apnea    no cpap-continues to be evaluated    Past Surgical History:  Procedure Laterality Date  . ABDOMINAL HYSTERECTOMY    . BACK SURGERY     lumbar fusion  . CATARACT EXTRACTION, BILATERAL    . CHOLECYSTECTOMY    . COLONOSCOPY WITH PROPOFOL N/A 07/30/2017   Procedure: COLONOSCOPY WITH PROPOFOL;  Surgeon: Juanita Craver, MD;  Location: [facility];  Service: Endoscopy;  Laterality: N/A;  . EXPLORATORY LAPAROTOMY     ovaries freed from intestines.  Marland Kitchen FOOT SURGERY     retained hardware.  Marland Kitchen KNEE ARTHROSCOPY Right    scope surgery  . TONSILLECTOMY    . TOTAL KNEE ARTHROPLASTY Left  02/16/2015   Procedure: LEFT TOTAL KNEE ARTHROPLASTY;  Surgeon: Sydnee Cabal, MD;  Location: [facility];  Service: Orthopedics;  Laterality: Left;  . WRIST SURGERY Left    x2 "cysts"    There were no vitals filed for this visit.   Subjective Assessment - 05/09/19 1322    Subjective  Chronic LBP (previous fusion 13 years ago, chronic pain of both knees (Lt TKA 4 years ago. She has been in pain ever since, says her knee still swells up and that she will need Rt TKA but she needs to lose weight first.    Pertinent History  lumbar fusion, Lt TKA, DM, morbid obesity    Limitations  Lifting;Standing;Walking;House hold activities    How long can you stand comfortably?  10 min    How long can you walk comfortably?  10 min    Diagnostic tests  04/19/19 imaging: Lumbar XR "Previous fusion Z0-C5 without complication", HIp XR mod degenerative changes, knee XR Lt shows V, and Knee XR Rt shows mod degen changes with spurring"    Patient Stated Goals  reduce pain, improve Rt knee ROM, lose weight    Currently in Pain?  Yes    Pain Score  9    she reports pain is 15, but adjusted to 9 as she does not  need ER   Pain Location  Knee   and back   Pain Orientation  Right;Left    Pain Descriptors / Indicators  Aching    Pain Type  Chronic pain    Pain Radiating Towards  from her back down her right leg down to her foot, then in Lt knee down to her foot    Pain Onset  More than a month ago    Pain Frequency  Constant    Aggravating Factors   standing, walking, pain at night    Pain Relieving Factors  rest    Multiple Pain Sites  --   see above        [facility] PT Assessment - 05/09/19 0001      Assessment   Medical Diagnosis  Chronic LBP (previous fusion 13 years ago, chronic pain of both knees (Lt TKA 4 years ago    Referring Provider (PT)  Nathaniel Man    Next MD Visit  05/13/19    Prior Therapy  no recent PT      Precautions   Precautions  None      Restrictions   Weight Bearing  Restrictions  No      Balance Screen   Has the patient fallen in the past 6 months  No      [place] residence      Prior Function   Level of Independence  Independent      Cognition   Overall Cognitive Status  Within Functional Limits for tasks assessed      Sensation   Light Touch  Appears Intact      Coordination   Gross Motor Movements are Fluid and Coordinated  Yes      Posture/Postural Control   Posture Comments  possible scoliosis and lateral tunk shift Lt, Rt shoulder lower than Lt      ROM / Strength   AROM / PROM / Strength  AROM;Strength      AROM   AROM Assessment Site  Knee;Lumbar    Right/Left Knee  Right;Left    Right Knee Extension  10    Right Knee Flexion  80    Left Knee Extension  105    Left Knee Flexion  0    Lumbar Flexion  75%    Lumbar Extension  25%    Lumbar - Right Side Bend  75%    Lumbar - Left Side Bend  75%    Lumbar - Right Rotation  50%    Lumbar - Left Rotation  50%      Strength   Overall Strength Comments  hip strength grossly 3+/5 MMT, knee strength grossly 4/5 MMT      Flexibility   Soft Tissue Assessment /Muscle Length  --   tight hamstrings bilat, hip rotators, lumbar P.S.     Palpation   Palpation comment  TTP lumbar paraspinals, anterior medial knees      Special Tests   Other special tests  neg SLR test, neg slump test      Transfers   Comments  Independent with all transfers but supine to sit needed min A      Ambulation/Gait   Gait Comments  slow antalgic gait                Objective measurements completed on examination: See above findings.      [facility] PT Treatment/Exercise - 05/09/19 0001  Modalities   Modalities  Electrical Stimulation;Moist Heat      Moist Heat Therapy   Number Minutes Moist Heat  10 Minutes    Moist Heat Location  Lumbar Spine;Knee      Electrical Stimulation   Electrical Stimulation Location  knees    Electrical  Stimulation Action  pre mod    Electrical Stimulation Parameters  tolerance    Electrical Stimulation Goals  Pain             PT Education - 05/09/19 2002    Education Details  HEP, POC, TENS    Person(s) Educated  Patient    Methods  Explanation;Demonstration;Verbal cues;Handout    Comprehension  Verbalized understanding;Need further instruction          PT Long Term Goals - 05/09/19 2009      PT LONG TERM GOAL #1   Title  Pt will be I and compliant with HEP. (Target for all goals 6 weeks 06/20/19)    Status  New      PT LONG TERM GOAL #2   Title  Pt will improve lumbar and knee ROM to [facility].    Status  New      PT LONG TERM GOAL #3   Title  Pt will improve bilat leg strength to at least 4+/5 to improve function.    Status  New      PT LONG TERM GOAL #4   Title  Pt will reduce overall pain less than 5/10 with ususal activity.    Status  New             Plan - 05/09/19 2004    Clinical Impression Statement  Pt presents with Chronic LBP (previous fusion 13 years ago, chronic pain of both knees (Lt TKA 4 years ago. Her Rt knee pain is likely due to OA. She has overall decreased lumbar and bilat knee ROM, decreased lumbar, core, and LE strength, decreased activity tolerance for standing or walking and increased pain limiting her function. She will benefit from skilled PT to address her deficits. She wasTrialed and educated on home TENS for pain managment.    Personal Factors and Comorbidities  Comorbidity 2;Time since onset of injury/illness/exacerbation;Fitness    Comorbidities  chronic pain,DM, sleep apnea, obesity    Examination-Activity Limitations  Bend;Carry;Squat;Stairs;Sleep;Locomotion Level;Lift;Stand;Transfers    Examination-Participation Restrictions  Cleaning;Community Activity;Shop;Laundry    Stability/Clinical Decision Making  Evolving/Moderate complexity    Clinical Decision Making  Moderate    Rehab Potential  Fair    PT Frequency  2x / week   1-2    PT Duration  12 weeks    PT Treatment/Interventions  ADLs/Self Care Home Management;Aquatic Therapy;Cryotherapy;Electrical Stimulation;Iontophoresis 50m/ml Dexamethasone;Moist Heat;Ultrasound;Gait training;Stair training;Functional mobility training;Therapeutic activities;Therapeutic exercise;Balance training;Neuromuscular re-education;Patient/family education;Manual techniques;Passive range of motion;Dry needling;Energy conservation;Joint Manipulations    PT Next Visit Plan  review and update HEP PRN, how was TENS, needs general strengthening and Rt knee flexion ROM    PT Home Exercise Plan  Access Code: [email or web address]    Consulted and Agree with Plan of Care  Patient       Patient will benefit from skilled therapeutic intervention in order to improve the following deficits and impairments:  Abnormal gait, Decreased activity tolerance, Decreased endurance, Decreased mobility, Decreased range of motion, Difficulty walking, Decreased strength, Impaired flexibility, Increased muscle spasms, Obesity, Postural dysfunction, Pain  Visit Diagnosis: Chronic bilateral low back pain, unspecified whether sciatica present  Chronic pain of right knee  Chronic pain of  left knee  Muscle weakness (generalized)     Problem List Patient Active Problem List   Diagnosis Date Noted  . 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 Osteoarthritis of left knee 02/16/2015  . OBESITY 10/25/2007  . HIATAL HERNIA 10/25/2007  . ABDOMINAL PAIN-EPIGASTRIC 10/25/2007    Silvestre Mesi 05/09/2019, 8:15 PM  [facility] Physical Therapy [address] [place], [place], [address] Phone: [phone]   Fax:  [phone]  Name: LARSEN ZETTEL MRN: [id number] Date of Birth: July 02, 1945

## 2019-05-09 NOTE — Patient Instructions (Signed)
Access Code: [email or web address]  URL: [email or web address]  Date: 05/09/2019  Prepared by: Elsie Ra   Exercises  Seated Ankle Dorsiflexion Stretch - 10 reps - 102 sets - 5 sec hold - 2x daily - 6x weekly  Seated Hamstring Stretch - 2 reps - 1 sets - 30 hold - 2x daily - 6x weekly  Supine Bridge - 10 reps - 1-2 sets - 5 hold - 2x daily - 6x weekly  Supine Lower Trunk Rotation - 10 reps - 1-2 sets - 5 hold - 2x daily - 6x weekly  Seated Long Arc Quad - 10 reps - 2-3 sets - 2x daily - 6x weekly  Standing Alternating Knee Flexion - 10 reps - 1-2 sets - 2x daily - 6x weekly  Sit to Stand with Armchair - 5 reps - 1-3 sets - 2x daily - 6x weekly  Patient Education  TENS Unit

## 2019-05-13 ENCOUNTER — Encounter: Payer: Self-pay | Admitting: Orthopaedic Surgery

## 2019-05-13 ENCOUNTER — Ambulatory Visit (INDEPENDENT_AMBULATORY_CARE_PROVIDER_SITE_OTHER): Payer: Medicare PPO

## 2019-05-13 ENCOUNTER — Ambulatory Visit: Payer: Self-pay

## 2019-05-13 ENCOUNTER — Ambulatory Visit (INDEPENDENT_AMBULATORY_CARE_PROVIDER_SITE_OTHER): Payer: Medicare PPO | Admitting: Orthopaedic Surgery

## 2019-05-13 ENCOUNTER — Other Ambulatory Visit: Payer: Self-pay

## 2019-05-13 DIAGNOSIS — M19012 Primary osteoarthritis, left shoulder: Secondary | ICD-10-CM

## 2019-05-13 DIAGNOSIS — M19011 Primary osteoarthritis, right shoulder: Secondary | ICD-10-CM | POA: Diagnosis not present

## 2019-05-13 NOTE — Progress Notes (Signed)
Office Visit Note   Patient: [name]           Date of Birth: 1945-10-23           MRN: [id number] Visit Date: 05/13/2019              Requested by: Eligah East, DO No address on file PCP: Eligah East, DO   Assessment & Plan: Visit Diagnoses:  1. Primary osteoarthritis of both shoulders     Plan: Impression is bilateral shoulder degenerative joint disease.  The patient does not want to proceed with cortisone injections.  We will refer her to Dr. Marlou Sa for possible surgical consultation for shoulder replacement surgery.  She will follow-up with [place] as needed.  Follow-Up Instructions: Return for f/u with Dr. Marlou Sa for bilateral shoulder consultation.   Orders:  Orders Placed This Encounter  Procedures  . XR Shoulder Left  . XR Shoulder Right   No orders of the defined types were placed in this encounter.     Procedures: No procedures performed   Clinical Data: No additional findings.   Subjective: Chief Complaint  Patient presents with  . Left Shoulder - Pain  . Right Shoulder - Pain    HPI patient is a pleasant 74 year old female who presents to our clinic with bilateral shoulder pain right greater than left.  This has been ongoing for over a year.  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  Pain she is having is to the anterior shoulder aggravated with lifting her arms or trying to turn over in bed.  She has been taking Tylenol without relief of symptoms.  She does note that she is all Dr. Theda Sers for her right shoulder over 6 months ago.  He injected this with cortisone.  She is unsure where exactly the injection was performed but notes that she did not have long-lasting relief.  Review of Systems as detailed in HPI.  All others reviewed and are negative.   Objective: Vital Signs: There were no vitals taken for this visit.  Physical Exam well-developed well-nourished female no acute distress.  Alert and  oriented x3.  Ortho Exam examination of the right shoulder reveals approximately 75% active range of motion secondary to pain and stiffness.  Positive empty can and cross body adduction.  Left shoulder shows approximately 90% range of motion all planes.  She is neurovascular intact distally there.  Specialty Comments:  No specialty comments available.  Imaging: XR Shoulder Left  Result Date: 05/13/2019 Marked glenohumeral and AC joint degenerative changes  XR Shoulder Right  Result Date: 05/13/2019 Marked glenohumeral and AC joint degenerative changes     PMFS History: Patient Active Problem List   Diagnosis Date Noted  . 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 Osteoarthritis of left knee 02/16/2015  . OBESITY 10/25/2007  . HIATAL HERNIA 10/25/2007  . ABDOMINAL PAIN-EPIGASTRIC 10/25/2007   Past Medical History:  Diagnosis Date  . Arthritis    osteoarthritis left knee, right shoulder "locks" occ.  . Diabetes mellitus without complication ([place])   . H/O seasonal allergies   . Hypertension   . Sleep apnea    no cpap-continues to be evaluated    History reviewed. No pertinent family history.  Past Surgical History:  Procedure Laterality Date  . ABDOMINAL HYSTERECTOMY    . BACK SURGERY     lumbar fusion  . CATARACT EXTRACTION, BILATERAL    . CHOLECYSTECTOMY    .  COLONOSCOPY WITH PROPOFOL N/A 07/30/2017   Procedure: COLONOSCOPY WITH PROPOFOL;  Surgeon: Juanita Craver, MD;  [facility];  Service: Endoscopy;  Laterality: N/A;  . EXPLORATORY LAPAROTOMY     ovaries freed from intestines.  Marland Kitchen FOOT SURGERY     retained hardware.  Marland Kitchen KNEE ARTHROSCOPY Right    scope surgery  . TONSILLECTOMY    . TOTAL KNEE ARTHROPLASTY Left 02/16/2015   Procedure: LEFT TOTAL KNEE ARTHROPLASTY;  Surgeon: Sydnee Cabal, MD;  Location: [facility];  Service: Orthopedics;  Laterality: Left;  . WRIST SURGERY Left    x2 "cysts"   Social History   Occupational History  . Not on file  Tobacco Use    . Smoking status: Former Smoker    Packs/day: 0.50    Years: 10.00    Pack years: 5.00    Types: Cigarettes    Quit date: 02/07/1973    Years since quitting: 46.2  . Smokeless tobacco: Never Used  Substance and Sexual Activity  . Alcohol use: Yes    Comment: rare occ.  . Drug use: No  . Sexual activity: Not on file

## 2019-05-17 ENCOUNTER — Other Ambulatory Visit: Payer: Self-pay

## 2019-05-17 ENCOUNTER — Ambulatory Visit
Admission: RE | Admit: 2019-05-17 | Discharge: 2019-05-17 | Disposition: A | Discharge status: Home / Self Care | Payer: Medicare PPO | Source: Ambulatory Visit | Attending: Urgent Care | Admitting: Urgent Care

## 2019-05-17 ENCOUNTER — Ambulatory Visit: Payer: Medicare Other

## 2019-05-17 DIAGNOSIS — Z1231 Encounter for screening mammogram for malignant neoplasm of breast: Secondary | ICD-10-CM

## 2019-05-18 ENCOUNTER — Ambulatory Visit: Payer: Medicare PPO | Admitting: Orthopedic Surgery

## 2019-06-01 ENCOUNTER — Ambulatory Visit (INDEPENDENT_AMBULATORY_CARE_PROVIDER_SITE_OTHER): Payer: Medicare PPO | Admitting: Orthopedic Surgery

## 2019-06-01 ENCOUNTER — Other Ambulatory Visit: Payer: Self-pay

## 2019-06-01 DIAGNOSIS — M19012 Primary osteoarthritis, left shoulder: Secondary | ICD-10-CM

## 2019-06-01 DIAGNOSIS — M19011 Primary osteoarthritis, right shoulder: Secondary | ICD-10-CM

## 2019-06-02 ENCOUNTER — Encounter: Payer: Self-pay | Admitting: Orthopedic Surgery

## 2019-06-02 NOTE — Progress Notes (Signed)
Office Visit Note   Patient: [name]           Date of Birth: April 21, 1945           MRN: [id number] Visit Date: 06/01/2019 Requested by: Eligah East, DO No address on file PCP: Eligah East, DO  Subjective: Chief Complaint  Patient presents with  . Left Shoulder - Pain  . Right Shoulder - Pain    HPI: [name] is a patient with bilateral shoulder pain right worse than left.  She is right-hand dominant.  The pain is been going on for over a year.  She has tried an injection without much relief.  She reports locking and catching with diminished range of motion.  Hard for her to lift things overhead.  Hurts for her to drive and to work.  She is really having to do a lot of things with her left arm which is doing reasonably well.  She has a history of left total knee replacement 4 years ago.  She would like to get the shoulder fixed so she can focus on achieving a higher sense of wellbeing.              ROS: All systems reviewed are negative as they relate to the chief complaint within the history of present illness.  Patient denies  fevers or chills.   Assessment & Plan: Visit Diagnoses:  1. Primary osteoarthritis of both shoulders     Plan: Impression is end-stage right shoulder arthritis.  I think she is ready for shoulder replacement.  The risk and benefits of shoulder replacement are discussed with [name] including but not limited to infection nerve vessel damage dislocation potential need for revision surgery.  Although she 35 we will have to see what the CT scan looks like in terms of rotator cuff function.  She is a little weak to supraspinatus testing today.  Follow-up after that study and we will plan for surgical intervention.  All questions answered.  She is far enough away from an injection that we can proceed with surgical intervention by the time preoperative templating and planning is complete.  Follow-Up Instructions: No follow-ups on file.   Orders:  Orders Placed  This Encounter  Procedures  . CT SHOULDER RIGHT WO CONTRAST   No orders of the defined types were placed in this encounter.     Procedures: No procedures performed   Clinical Data: No additional findings.  Objective: Vital Signs: There were no vitals taken for this visit.  Physical Exam:   Constitutional: Patient appears well-developed HEENT:  Head: Normocephalic Eyes:EOM are normal Neck: Normal range of motion Cardiovascular: Normal rate Pulmonary/chest: Effort normal Neurologic: Patient is alert Skin: Skin is warm Psychiatric: Patient has normal mood and affect    Ortho Exam: Ortho exam demonstrates painful active and passive range of motion of that right shoulder.  She has flexion and abduction on the right-hand side both below 90 degrees.  External rotation of 15 degrees of abduction is 20 degrees.  Subscap strength is intact.  Infraspinatus strength is reasonable but subscap strength is slightly less on the right than the left.  Left shoulder has good active and passive range of motion without much restriction.  Deltoid is functional.  Specialty Comments:  No specialty comments available.  Imaging: No results found.   PMFS History: Patient Active Problem List   Diagnosis Date Noted  . 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 Osteoarthritis of left knee 02/16/2015  . OBESITY 10/25/2007  .  HIATAL HERNIA 10/25/2007  . ABDOMINAL PAIN-EPIGASTRIC 10/25/2007   Past Medical History:  Diagnosis Date  . Arthritis    osteoarthritis left knee, right shoulder "locks" occ.  . Diabetes mellitus without complication ([place])   . H/O seasonal allergies   . Hypertension   . Sleep apnea    no cpap-continues to be evaluated    History reviewed. No pertinent family history.  Past Surgical History:  Procedure Laterality Date  . ABDOMINAL HYSTERECTOMY    . BACK SURGERY     lumbar fusion  . CATARACT EXTRACTION, BILATERAL    . CHOLECYSTECTOMY    . COLONOSCOPY WITH PROPOFOL N/A  07/30/2017   Procedure: COLONOSCOPY WITH PROPOFOL;  Surgeon: Juanita Craver, MD;  [facility];  Service: Endoscopy;  Laterality: N/A;  . EXPLORATORY LAPAROTOMY     ovaries freed from intestines.  Marland Kitchen FOOT SURGERY     retained hardware.  Marland Kitchen KNEE ARTHROSCOPY Right    scope surgery  . TONSILLECTOMY    . TOTAL KNEE ARTHROPLASTY Left 02/16/2015   Procedure: LEFT TOTAL KNEE ARTHROPLASTY;  Surgeon: Sydnee Cabal, MD;  [facility];  Service: Orthopedics;  Laterality: Left;  . WRIST SURGERY Left    x2 "cysts"   Social History   Occupational History  . Not on file  Tobacco Use  . Smoking status: Former Smoker    Packs/day: 0.50    Years: 10.00    Pack years: 5.00    Types: Cigarettes    Quit date: 02/07/1973    Years since quitting: 46.3  . Smokeless tobacco: Never Used  Substance and Sexual Activity  . Alcohol use: Yes    Comment: rare occ.  . Drug use: No  . Sexual activity: Not on file

## 2019-06-09 ENCOUNTER — Other Ambulatory Visit: Payer: Self-pay

## 2019-06-09 ENCOUNTER — Ambulatory Visit
Admission: RE | Admit: 2019-06-09 | Discharge: 2019-06-09 | Disposition: A | Discharge status: Home / Self Care | Payer: Medicare PPO | Source: Ambulatory Visit | Attending: Orthopedic Surgery | Admitting: Orthopedic Surgery

## 2019-06-09 DIAGNOSIS — M19011 Primary osteoarthritis, right shoulder: Secondary | ICD-10-CM

## 2019-06-09 DIAGNOSIS — M19012 Primary osteoarthritis, left shoulder: Secondary | ICD-10-CM

## 2019-06-13 ENCOUNTER — Other Ambulatory Visit: Payer: Self-pay

## 2019-06-13 ENCOUNTER — Encounter: Payer: Self-pay | Admitting: Orthopedic Surgery

## 2019-06-13 ENCOUNTER — Ambulatory Visit: Payer: Medicare PPO | Admitting: Orthopaedic Surgery

## 2019-06-13 ENCOUNTER — Ambulatory Visit: Payer: Medicare PPO | Admitting: Orthopedic Surgery

## 2019-06-13 DIAGNOSIS — M19012 Primary osteoarthritis, left shoulder: Secondary | ICD-10-CM

## 2019-06-13 DIAGNOSIS — M19011 Primary osteoarthritis, right shoulder: Secondary | ICD-10-CM | POA: Diagnosis not present

## 2019-06-15 ENCOUNTER — Other Ambulatory Visit: Payer: Self-pay

## 2019-06-17 ENCOUNTER — Encounter: Payer: Self-pay | Admitting: Orthopedic Surgery

## 2019-06-17 NOTE — Progress Notes (Signed)
Office Visit Note   Patient: [name]           Date of Birth: [birth year]           MRN: [id number] Visit Date: 06/13/2019 Requested by: Eligah East, DO No address on file PCP: Eligah East, DO  Subjective: Chief Complaint  Patient presents with  . Right Shoulder - Follow-up    CT Review   . Follow-up    HPI: [name] is a 74 y.o. female who presents to the office complaining of right shoulder pain.  She returns for CT scan review of the right shoulder.  CT scan was obtained for preoperative planning for patient specific instrumentation and the results were discussed with the patient.  CT scan revealed advanced glenohumeral joint degenerative changes without acute fracture or AVN.  Rotator cuff tendons appeared intact.  Patient notes that she is moving in with her daughter soon.  She takes Tylenol Extra Strength for pain control.  She denies any history of DVT or pulmonary embolism but does note a history of diabetes that is well controlled with an A1c of 6.0.  She is pursuing right knee replacement as well and needs to lose 16 more pounds to be cleared for surgery.  She denies any history of smoking or cardiac issues.                ROS:  All systems reviewed are negative as they relate to the chief complaint within the history of present illness.  Patient denies fevers or chills.  Assessment & Plan: Visit Diagnoses:  1. Primary osteoarthritis of both shoulders     Plan: Patient is a 74 year old female who presents for CT scan review of right shoulder.  Right shoulder CT scan was obtained for preoperative planning.  Rotator cuff tendons appear to be intact on the CT scan and she has excellent strength on exam today.  Plan is for anatomic total shoulder replacement with possible convertible glenoid baseplate.  She has good range of motion with 90 degrees of abduction, 90 degrees of forward flexion, 15 degrees of external rotation.  She has had no recent injection.  Plan to  place patient for surgery in April 2021.  Risk and benefits of surgery are discussed including but not limited to infection nerve vessel damage potential need for revision surgery as well as incomplete pain relief and some loss of motion.  Patient understands the risk and benefits and wishes to proceed.  All questions answered  Follow-Up Instructions: No follow-ups on file.   Orders:  No orders of the defined types were placed in this encounter.  No orders of the defined types were placed in this encounter.     Procedures: No procedures performed   Clinical Data: No additional findings.  Objective: Vital Signs: There were no vitals taken for this visit.  Physical Exam:  Constitutional: Patient appears well-developed HEENT:  Head: Normocephalic Eyes:EOM are normal Neck: Normal range of motion Cardiovascular: Normal rate Pulmonary/chest: Effort normal Neurologic: Patient is alert Skin: Skin is warm Psychiatric: Patient has normal mood and affect  Ortho Exam:  Right shoulder Exam 90 degrees abduction, 90 degrees forward flexion, 15 degrees external rotation Good subscapularis, supraspinatus, and infraspinatus strength 5/5 grip strength, forearm pronation/supination, and bicep strength  Specialty Comments:  No specialty comments available.  Imaging: No results found.   PMFS History: Patient Active Problem List   Diagnosis Date Noted  . 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 Osteoarthritis of  left knee 02/16/2015  . OBESITY 10/25/2007  . HIATAL HERNIA 10/25/2007  . ABDOMINAL PAIN-EPIGASTRIC 10/25/2007   Past Medical History:  Diagnosis Date  . Arthritis    osteoarthritis left knee, right shoulder "locks" occ.  . Diabetes mellitus without complication ([place])   . H/O seasonal allergies   . Hypertension   . Sleep apnea    no cpap-continues to be evaluated    History reviewed. No pertinent family history.  Past Surgical History:  Procedure Laterality Date  .  ABDOMINAL HYSTERECTOMY    . BACK SURGERY     lumbar fusion  . CATARACT EXTRACTION, BILATERAL    . CHOLECYSTECTOMY    . COLONOSCOPY WITH PROPOFOL N/A 07/30/2017   Procedure: COLONOSCOPY WITH PROPOFOL;  Surgeon: Juanita Craver, MD;  [facility];  Service: Endoscopy;  Laterality: N/A;  . EXPLORATORY LAPAROTOMY     ovaries freed from intestines.  Marland Kitchen FOOT SURGERY     retained hardware.  Marland Kitchen KNEE ARTHROSCOPY Right    scope surgery  . TONSILLECTOMY    . TOTAL KNEE ARTHROPLASTY Left 02/16/2015   Procedure: LEFT TOTAL KNEE ARTHROPLASTY;  Surgeon: Sydnee Cabal, MD;  Location: [facility];  Service: Orthopedics;  Laterality: Left;  . WRIST SURGERY Left    x2 "cysts"   Social History   Occupational History  . Not on file  Tobacco Use  . Smoking status: Former Smoker    Packs/day: 0.50    Years: 10.00    Pack years: 5.00    Types: Cigarettes    Quit date: 02/07/1973    Years since quitting: 46.3  . Smokeless tobacco: Never Used  Substance and Sexual Activity  . Alcohol use: Yes    Comment: rare occ.  . Drug use: No  . Sexual activity: Not on file

## 2019-06-20 ENCOUNTER — Encounter: Payer: Self-pay | Admitting: Orthopedic Surgery

## 2019-07-22 ENCOUNTER — Other Ambulatory Visit (HOSPITAL_COMMUNITY)
Admission: RE | Admit: 2019-07-22 | Discharge: 2019-07-22 | Disposition: A | Discharge status: Home / Self Care | Payer: Medicare PPO | Source: Ambulatory Visit | Attending: Orthopedic Surgery | Admitting: Orthopedic Surgery

## 2019-07-22 ENCOUNTER — Inpatient Hospital Stay (HOSPITAL_COMMUNITY)
Admission: RE | Admit: 2019-07-22 | Discharge: 2019-07-22 | Disposition: A | Discharge status: Home / Self Care | Payer: Medicare PPO | Source: Ambulatory Visit

## 2019-07-22 DIAGNOSIS — Z01812 Encounter for preprocedural laboratory examination: Secondary | ICD-10-CM | POA: Diagnosis present

## 2019-07-22 DIAGNOSIS — Z20822 Contact with and (suspected) exposure to covid-19: Secondary | ICD-10-CM | POA: Insufficient documentation

## 2019-07-22 LAB — SARS CORONAVIRUS 2 (TAT 6-24 HRS): SARS Coronavirus 2: NEGATIVE

## 2019-07-22 NOTE — Pre-Procedure Instructions (Signed)
[name]  07/22/2019      [facility] [id number] - [address] [name] [address] Phone: [phone] Fax: [phone]    Your procedure is scheduled on April 13  Report to [facility] Entrance A at 5:30 A.M.  Call this number if you have problems the morning of surgery:  [phone]   Remember:  Do not eat or drink after midnight.  You may drink clear liquids until 4:30 a.m.Marland Kitchen  Clear liquids allowed are:                    Water, Juice (non-citric and without pulp), Carbonated beverages, Clear Tea, Black Coffee only, Plain Jell-O only, Gatorade and Plain Popsicles only               Enhanced Recovery after Surgery for Orthopedics Enhanced Recovery after Surgery is a protocol used to improve the stress on your body and your recovery after surgery.  Patient Instructions  . The night before surgery:  o No food after midnight. ONLY clear liquids after midnight  .   Marland Kitchen The day of surgery (if you have diabetes): o  o Drink ONE (1) 10oz. Water bottle as directed. o This drink was given to you during your hospital    (drink it by 4:30 A.M.) pre-op appointment visit.  o The pre-op nurse will instruct you on the time to drink the   Gatorade 2 (G2) depending on your surgery time. o Color of the Gatorade may vary. Red is not allowed. o Nothing else to drink after completing the  Gatorade 2 (G2).         If you have questions, please contact your surgeon's office.     Take these medicines the morning of surgery with A SIP OF WATER :               Amlodipine (norvasc)              flonase nasal spray if needed              Sertraline (zoloft)              7 days prior to surgery STOP taking any Aspirin (unless otherwise instructed by your surgeon), Aleve, Naproxen, Ibuprofen, Motrin, Advil, Goody's, BC's, all herbal medications, fish oil, and all vitamins.                  How to Manage  Your Diabetes Before and After Surgery  Why is it important to control my blood sugar before and after surgery? . Improving blood sugar levels before and after surgery helps healing and can limit problems. . A way of improving blood sugar control is eating a healthy diet by: o  Eating less sugar and carbohydrates o  Increasing activity/exercise o  Talking with your doctor about reaching your blood sugar goals . High blood sugars (greater than 180 mg/dL) can raise your risk of infections and slow your recovery, so you will need to focus on controlling your diabetes during the weeks before surgery. . Make sure that the doctor who takes care of your diabetes knows about your planned surgery including the date and location.  How do I manage my blood sugar before surgery? . Check your blood sugar at least 4 times a day, starting 2 days before surgery, to make sure  that the level is not too high or low. o Check your blood sugar the morning of your surgery when you wake up and every 2 hours until you get to the Short Stay unit. . If your blood sugar is less than 70 mg/dL, you will need to treat for low blood sugar: o Do not take insulin. o Treat a low blood sugar (less than 70 mg/dL) with  cup of clear juice (cranberry or apple), 4 glucose tablets, OR glucose gel. Recheck blood sugar in 15 minutes after treatment (to make sure it is greater than 70 mg/dL). If your blood sugar is not greater than 70 mg/dL on recheck, call [phone] o  for further instructions. . Report your blood sugar to the short stay nurse when you get to Short Stay.  . If you are admitted to the hospital after surgery: o Your blood sugar will be checked by the staff and you will probably be given insulin after surgery (instead of oral diabetes medicines) to make sure you have good blood sugar levels. o The goal for blood sugar control after surgery is 80-180 mg/dL.       WHAT DO I DO ABOUT MY DIABETES  MEDICATION?    . THE NIGHT BEFORE SURGERY, take _____none______ units of __Lantus_________insulin.  [facility]- Preparing for Total Shoulder Arthroplasty   Before surgery, you can play an important role. Because skin is not sterile, your skin needs to be as free of germs as possible. You can reduce the number of germs on your skin by using the following products. . Benzoyl Peroxide Gel o Reduces the number of germs present on the skin o Applied twice a day to shoulder area starting two days before surgery   . Chlorhexidine Gluconate (CHG) Soap o An antiseptic cleaner that kills germs and bonds with the skin to continue killing germs even after washing o Used for showering the night before surgery and morning of surgery   Oral Hygiene is also important to reduce your risk of infection.                                    Remember - BRUSH YOUR TEETH THE MORNING OF SURGERY WITH YOUR REGULAR TOOTHPASTE  ==================================================================  Please follow these instructions carefully:  BENZOYL PEROXIDE 5% GEL  Please do not use if you have an allergy to benzoyl peroxide.   If your skin becomes reddened/irritated stop using the benzoyl peroxide.  Starting two days before surgery, apply as follows: 1. Apply benzoyl peroxide in the morning and at night. Apply after taking a shower. If you are not taking a shower clean entire shoulder front, back, and side along with the armpit with a clean wet washcloth.  2. Place a quarter-sized dollop on your shoulder and rub in thoroughly, making sure to cover the front, back, and side of your shoulder, along with the armpit.   2 days before ____ AM   ____ PM              1 day  before ____ AM   ____ PM                            3.  Do this twice a day for two days.  (Last application is the night before surgery, AFTER using the CHG soap as described  below).  4. Do NOT apply benzoyl peroxide gel on the day of surgery.  CHLORHEXIDINE GLUCONATE (CHG) SOAP  Please do not use if you have an allergy to CHG or antibacterial soaps. If your skin becomes reddened/irritated stop using the CHG.   Do not shave (including legs and underarms) for at least 48 hours prior to first CHG shower. It is OK to shave your face.  Starting the night before surgery, use CHG soap as follows:  1. Shower the NIGHT BEFORE SURGERY and MORNING OF SURGERY with CHG.  2. If you choose to wash your hair, wash your hair first as usual with your normal shampoo.  3. After shampooing, rinse your hair and body thoroughly to remove the shampoo.  4. Use CHG as you would any other liquid soap.  You can apply CHG directly to the skin and wash gently with a scrungie or a clean washcloth.  5. Apply the CHG soap to your body ONLY FROM THE NECK DOWN.  Do not use on open wounds or open sores.  Avoid contact with your eyes, ears, mouth, and genitals (private parts).  Wash face and genitals (private parts) with your normal soap.  6. Wash thoroughly, paying special attention to the area where your surgery will be performed.  7. Thoroughly rinse your body with warm water from the neck down.  8. DO NOT shower/wash with your normal soap after using and rinsing off the CHG soap.   9. Pat yourself dry with a CLEAN TOWEL.   10.  Apply benzoyl peroxide.   11. Wear CLEAN PAJAMAS to bed the night before surgery; wear comfortable clothes the morning of surgery.  12. Place CLEAN SHEETS on your bed the night of your first shower and DO NOT SLEEP WITH PETS.  Day of Surgery: Shower as above Do not apply any deodorants/lotions.  Please wear clean clothes to the hospital/surgery center.   Remember to brush your teeth WITH YOUR REGULAR TOOTHPASTE.      Do not wear jewelry, make-up or nail polish.  Do not wear lotions, powders, or perfumes, or deodorant.  Do not shave 48 hours prior to  surgery.  Men may shave face and neck.  Do not bring valuables to the hospital.  [facility] is not responsible for any belongings or valuables.  Contacts, dentures or bridgework may not be worn into surgery.  Leave your suitcase in the car.  After surgery it may be brought to your room.  For patients admitted to the hospital, discharge time will be determined by your treatment team.  Patients discharged the day of surgery will not be allowed to drive home.     Day of Surgery:  Do not apply any deodorants/lotions.  Please wear clean clothes to the hospital/surgery center.   Remember to brush your teeth WITH YOUR REGULAR TOOTHPASTE.    Please read over the following fact sheets that you were given. Coughing and Deep Breathing and Surgical Site Infection Prevention

## 2019-07-25 ENCOUNTER — Encounter (HOSPITAL_COMMUNITY): Payer: Self-pay | Admitting: Orthopedic Surgery

## 2019-07-25 ENCOUNTER — Other Ambulatory Visit: Payer: Self-pay

## 2019-07-25 MED ORDER — DEXTROSE 5 % IV SOLN
3.0000 g | INTRAVENOUS | Status: AC
  Administered 2019-07-26 (×2): 3 g via INTRAVENOUS
  Filled 2019-07-25: qty 3

## 2019-07-25 NOTE — Progress Notes (Signed)
Spoke with pt for pre-op call. Pt denies cardiac history. Pt is a type 2 diabetic, she states her last A1C was a "couple of months ago" and it was 6.0. Pt states her fasting blood sugar is usually around 143. Instructed pt to take 1/2 of her regular dose of Lantus insulin this evening, she will take 15 units. Instructed her to check her blood sugar when she gets up in the AM. If blood sugar is 70 or below, treat with 1/2 cup of clear juice (apple or cranberry) and recheck blood sugar 15 minutes after drinking juice. Instructed her to let nurse know on arrival if she had to drink the juice for low blood sugar. Pt voiced understanding.  Covid test done on 07/22/19 and it's negative. She states she has been in quarantine since she had the test done. She states she understands that she continues in quarantine until she comes to the hospital.

## 2019-07-26 ENCOUNTER — Observation Stay (HOSPITAL_COMMUNITY)
Admission: RE | Admit: 2019-07-26 | Discharge: 2019-07-28 | Disposition: A | Discharge status: Home / Self Care | Payer: Medicare PPO | Attending: Orthopedic Surgery | Admitting: Orthopedic Surgery

## 2019-07-26 ENCOUNTER — Ambulatory Visit (HOSPITAL_COMMUNITY): Payer: Medicare PPO | Admitting: Anesthesiology

## 2019-07-26 ENCOUNTER — Encounter (HOSPITAL_COMMUNITY)
Admission: RE | Disposition: A | Discharge status: Home / Self Care | Payer: Self-pay | Source: Home / Self Care | Attending: Orthopedic Surgery

## 2019-07-26 ENCOUNTER — Observation Stay (HOSPITAL_COMMUNITY): Payer: Medicare PPO

## 2019-07-26 ENCOUNTER — Encounter (HOSPITAL_COMMUNITY): Payer: Self-pay | Admitting: Orthopedic Surgery

## 2019-07-26 ENCOUNTER — Other Ambulatory Visit: Payer: Self-pay

## 2019-07-26 DIAGNOSIS — E119 Type 2 diabetes mellitus without complications: Secondary | ICD-10-CM | POA: Insufficient documentation

## 2019-07-26 DIAGNOSIS — I10 Essential (primary) hypertension: Secondary | ICD-10-CM | POA: Insufficient documentation

## 2019-07-26 DIAGNOSIS — Z794 Long term (current) use of insulin: Secondary | ICD-10-CM | POA: Diagnosis not present

## 2019-07-26 DIAGNOSIS — Z888 Allergy status to other drugs, medicaments and biological substances status: Secondary | ICD-10-CM | POA: Insufficient documentation

## 2019-07-26 DIAGNOSIS — G4730 Sleep apnea, unspecified: Secondary | ICD-10-CM | POA: Insufficient documentation

## 2019-07-26 DIAGNOSIS — F419 Anxiety disorder, unspecified: Secondary | ICD-10-CM | POA: Diagnosis not present

## 2019-07-26 DIAGNOSIS — Z79899 Other long term (current) drug therapy: Secondary | ICD-10-CM | POA: Diagnosis not present

## 2019-07-26 DIAGNOSIS — Z87891 Personal history of nicotine dependence: Secondary | ICD-10-CM | POA: Diagnosis not present

## 2019-07-26 DIAGNOSIS — Z9889 Other specified postprocedural states: Secondary | ICD-10-CM

## 2019-07-26 DIAGNOSIS — Z96611 Presence of right artificial shoulder joint: Secondary | ICD-10-CM

## 2019-07-26 DIAGNOSIS — M19011 Primary osteoarthritis, right shoulder: Principal | ICD-10-CM | POA: Insufficient documentation

## 2019-07-26 DIAGNOSIS — Z6841 Body Mass Index (BMI) 40.0 and over, adult: Secondary | ICD-10-CM | POA: Diagnosis not present

## 2019-07-26 DIAGNOSIS — Z96652 Presence of left artificial knee joint: Secondary | ICD-10-CM | POA: Insufficient documentation

## 2019-07-26 HISTORY — PX: TOTAL SHOULDER ARTHROPLASTY: SHX126

## 2019-07-26 HISTORY — DX: Pneumonia, unspecified organism: J18.9

## 2019-07-26 HISTORY — DX: Personal history of other diseases of the digestive system: Z87.19

## 2019-07-26 HISTORY — DX: Anxiety disorder, unspecified: F41.9

## 2019-07-26 LAB — CBC
HCT: 41.8 % (ref 36.0–46.0)
Hemoglobin: 13.6 g/dL (ref 12.0–15.0)
MCH: 27.9 pg (ref 26.0–34.0)
MCHC: 32.5 g/dL (ref 30.0–36.0)
MCV: 85.8 fL (ref 80.0–100.0)
Platelets: 304 10*3/uL (ref 150–400)
RBC: 4.87 MIL/uL (ref 3.87–5.11)
RDW: 14.2 % (ref 11.5–15.5)
WBC: 8.5 10*3/uL (ref 4.0–10.5)
nRBC: 0 % (ref 0.0–0.2)

## 2019-07-26 LAB — URINALYSIS, ROUTINE W REFLEX MICROSCOPIC
Bilirubin Urine: NEGATIVE
Glucose, UA: NEGATIVE mg/dL
Hgb urine dipstick: NEGATIVE
Ketones, ur: NEGATIVE mg/dL
Nitrite: NEGATIVE
Protein, ur: NEGATIVE mg/dL
Specific Gravity, Urine: 1.021 (ref 1.005–1.030)
pH: 5 (ref 5.0–8.0)

## 2019-07-26 LAB — BASIC METABOLIC PANEL
Anion gap: 13 (ref 5–15)
BUN: 20 mg/dL (ref 8–23)
CO2: 26 mmol/L (ref 22–32)
Calcium: 9.7 mg/dL (ref 8.9–10.3)
Chloride: 100 mmol/L (ref 98–111)
Creatinine, Ser: 0.94 mg/dL (ref 0.44–1.00)
GFR calc Af Amer: >60 mL/min (ref >60)
GFR calc non Af Amer: >60 mL/min (ref >60)
Glucose, Bld: 160 mg/dL — ABNORMAL HIGH (ref 70–99)
Potassium: 3.2 mmol/L — ABNORMAL LOW (ref 3.5–5.1)
Sodium: 139 mmol/L (ref 135–145)

## 2019-07-26 LAB — GLUCOSE, CAPILLARY
Glucose-Capillary: 170 mg/dL — ABNORMAL HIGH (ref 70–99)
Glucose-Capillary: 217 mg/dL — ABNORMAL HIGH (ref 70–99)
Glucose-Capillary: 211 mg/dL — ABNORMAL HIGH (ref 70–99)
Glucose-Capillary: 163 mg/dL — ABNORMAL HIGH (ref 70–99)

## 2019-07-26 SURGERY — ARTHROPLASTY, SHOULDER, TOTAL
Anesthesia: Regional | Site: Shoulder | Laterality: Right

## 2019-07-26 MED ORDER — PHENYLEPHRINE HCL-NACL 10-0.9 MG/250ML-% IV SOLN
INTRAVENOUS | Status: DC | PRN
  Administered 2019-07-26: 25 ug/min via INTRAVENOUS

## 2019-07-26 MED ORDER — PROPOFOL 10 MG/ML IV BOLUS
INTRAVENOUS | Status: AC
  Filled 2019-07-26: qty 40

## 2019-07-26 MED ORDER — PROPOFOL 10 MG/ML IV BOLUS
INTRAVENOUS | Status: DC | PRN
  Administered 2019-07-26: 50 mg via INTRAVENOUS
  Administered 2019-07-26: 40 mg via INTRAVENOUS
  Administered 2019-07-26: 160 mg via INTRAVENOUS
  Administered 2019-07-26: 50 mg via INTRAVENOUS

## 2019-07-26 MED ORDER — SUCCINYLCHOLINE CHLORIDE 20 MG/ML IJ SOLN
INTRAMUSCULAR | Status: DC | PRN
  Administered 2019-07-26: 100 mg via INTRAVENOUS

## 2019-07-26 MED ORDER — EPHEDRINE SULFATE 50 MG/ML IJ SOLN
INTRAMUSCULAR | Status: DC | PRN
  Administered 2019-07-26 (×3): 10 mg via INTRAVENOUS

## 2019-07-26 MED ORDER — FENTANYL CITRATE (PF) 100 MCG/2ML IJ SOLN
25.0000 ug | INTRAMUSCULAR | Status: DC | PRN
  Administered 2019-07-26: 13:00:00 25 ug via INTRAVENOUS

## 2019-07-26 MED ORDER — ACETAMINOPHEN 500 MG PO TABS
1000.0000 mg | ORAL_TABLET | Freq: Four times a day (QID) | ORAL | Status: AC
  Administered 2019-07-26 – 2019-07-27 (×4): 1000 mg via ORAL
  Filled 2019-07-26 (×4): qty 2

## 2019-07-26 MED ORDER — ONDANSETRON HCL 4 MG/2ML IJ SOLN: 4.0000 mg | Freq: Four times a day (QID) | INTRAMUSCULAR | Status: DC | PRN

## 2019-07-26 MED ORDER — VANCOMYCIN HCL 1000 MG IV SOLR
INTRAVENOUS | Status: AC
  Filled 2019-07-26: qty 1000

## 2019-07-26 MED ORDER — PHENYLEPHRINE HCL (PRESSORS) 10 MG/ML IV SOLN
INTRAVENOUS | Status: DC | PRN
  Administered 2019-07-26 (×2): 80 ug via INTRAVENOUS

## 2019-07-26 MED ORDER — DEXAMETHASONE SODIUM PHOSPHATE 10 MG/ML IJ SOLN
INTRAMUSCULAR | Status: AC
  Filled 2019-07-26: qty 1

## 2019-07-26 MED ORDER — GLYCOPYRROLATE PF 0.2 MG/ML IJ SOSY
PREFILLED_SYRINGE | INTRAMUSCULAR | Status: AC
  Filled 2019-07-26: qty 1

## 2019-07-26 MED ORDER — INSULIN ASPART 100 UNIT/ML ~~LOC~~ SOLN
4.0000 [IU] | Freq: Three times a day (TID) | SUBCUTANEOUS | Status: DC
  Administered 2019-07-26 – 2019-07-28 (×5): 4 [IU] via SUBCUTANEOUS

## 2019-07-26 MED ORDER — GLYCOPYRROLATE PF 0.2 MG/ML IJ SOSY
PREFILLED_SYRINGE | INTRAMUSCULAR | Status: DC | PRN
  Administered 2019-07-26: .2 mg via INTRAVENOUS

## 2019-07-26 MED ORDER — OXYCODONE HCL 5 MG PO TABS
5.0000 mg | ORAL_TABLET | ORAL | Status: DC | PRN
  Administered 2019-07-27: 5 mg via ORAL
  Administered 2019-07-27: 22:00:00 10 mg via ORAL
  Filled 2019-07-26: qty 2
  Filled 2019-07-26: qty 1

## 2019-07-26 MED ORDER — TOPIRAMATE 25 MG PO TABS
25.0000 mg | ORAL_TABLET | Freq: Every day | ORAL | Status: DC
  Administered 2019-07-26 – 2019-07-27 (×2): 25 mg via ORAL
  Filled 2019-07-26 (×2): qty 1

## 2019-07-26 MED ORDER — BUPIVACAINE LIPOSOME 1.3 % IJ SUSP
INTRAMUSCULAR | Status: DC | PRN
  Administered 2019-07-26: 10 mL via PERINEURAL

## 2019-07-26 MED ORDER — SUCCINYLCHOLINE CHLORIDE 200 MG/10ML IV SOSY
PREFILLED_SYRINGE | INTRAVENOUS | Status: AC
  Filled 2019-07-26: qty 10

## 2019-07-26 MED ORDER — DOCUSATE SODIUM 100 MG PO CAPS
100.0000 mg | ORAL_CAPSULE | Freq: Two times a day (BID) | ORAL | Status: DC
  Administered 2019-07-26 – 2019-07-28 (×4): 100 mg via ORAL
  Filled 2019-07-26 (×4): qty 1

## 2019-07-26 MED ORDER — TRANEXAMIC ACID-NACL 1000-0.7 MG/100ML-% IV SOLN
1000.0000 mg | INTRAVENOUS | Status: AC
  Administered 2019-07-26: 1000 mg via INTRAVENOUS
  Filled 2019-07-26: qty 100

## 2019-07-26 MED ORDER — OXYCODONE HCL 5 MG PO TABS
ORAL_TABLET | ORAL | Status: AC
  Filled 2019-07-26: qty 1

## 2019-07-26 MED ORDER — CELECOXIB 200 MG PO CAPS
200.0000 mg | ORAL_CAPSULE | Freq: Two times a day (BID) | ORAL | Status: DC
  Administered 2019-07-26 – 2019-07-28 (×4): 200 mg via ORAL
  Filled 2019-07-26 (×6): qty 1

## 2019-07-26 MED ORDER — IRRISEPT - 450ML BOTTLE WITH 0.05% CHG IN STERILE WATER, USP 99.95% OPTIME
TOPICAL | Status: DC | PRN
  Administered 2019-07-26: 450 mL via TOPICAL

## 2019-07-26 MED ORDER — LACTATED RINGERS IV SOLN: INTRAVENOUS | Status: DC | PRN

## 2019-07-26 MED ORDER — AMLODIPINE BESYLATE 5 MG PO TABS
5.0000 mg | ORAL_TABLET | Freq: Every day | ORAL | Status: DC
  Administered 2019-07-27 – 2019-07-28 (×2): 5 mg via ORAL
  Filled 2019-07-26 (×2): qty 1

## 2019-07-26 MED ORDER — CEFAZOLIN SODIUM-DEXTROSE 2-4 GM/100ML-% IV SOLN
2.0000 g | Freq: Three times a day (TID) | INTRAVENOUS | Status: AC
  Administered 2019-07-26 – 2019-07-27 (×2): 2 g via INTRAVENOUS
  Filled 2019-07-26 (×2): qty 100

## 2019-07-26 MED ORDER — PHENOL 1.4 % MT LIQD: 1.0000 | OROMUCOSAL | Status: DC | PRN

## 2019-07-26 MED ORDER — METOCLOPRAMIDE HCL 5 MG/ML IJ SOLN: 5.0000 mg | Freq: Three times a day (TID) | INTRAMUSCULAR | Status: DC | PRN

## 2019-07-26 MED ORDER — LACTATED RINGERS IV SOLN: INTRAVENOUS | Status: DC

## 2019-07-26 MED ORDER — ONDANSETRON HCL 4 MG PO TABS: 4.0000 mg | ORAL_TABLET | Freq: Four times a day (QID) | ORAL | Status: DC | PRN

## 2019-07-26 MED ORDER — BUPIVACAINE LIPOSOME 1.3 % IJ SUSP: INTRAMUSCULAR | Status: DC | PRN

## 2019-07-26 MED ORDER — MIDAZOLAM HCL 2 MG/2ML IJ SOLN
INTRAMUSCULAR | Status: AC
  Filled 2019-07-26: qty 2

## 2019-07-26 MED ORDER — DEXAMETHASONE SODIUM PHOSPHATE 10 MG/ML IJ SOLN
INTRAMUSCULAR | Status: DC | PRN
  Administered 2019-07-26: 5 mg via INTRAVENOUS

## 2019-07-26 MED ORDER — ASPIRIN 81 MG PO CHEW
81.0000 mg | CHEWABLE_TABLET | Freq: Every day | ORAL | Status: DC
  Administered 2019-07-27 – 2019-07-28 (×2): 81 mg via ORAL
  Filled 2019-07-26 (×2): qty 1

## 2019-07-26 MED ORDER — ONDANSETRON HCL 4 MG/2ML IJ SOLN
INTRAMUSCULAR | Status: AC
  Filled 2019-07-26: qty 2

## 2019-07-26 MED ORDER — LIDOCAINE 2% (20 MG/ML) 5 ML SYRINGE
INTRAMUSCULAR | Status: AC
  Filled 2019-07-26: qty 5

## 2019-07-26 MED ORDER — FENTANYL CITRATE (PF) 100 MCG/2ML IJ SOLN
INTRAMUSCULAR | Status: AC
  Filled 2019-07-26: qty 2

## 2019-07-26 MED ORDER — METHOCARBAMOL 500 MG PO TABS
ORAL_TABLET | ORAL | Status: AC
  Filled 2019-07-26: qty 1

## 2019-07-26 MED ORDER — FENTANYL CITRATE (PF) 250 MCG/5ML IJ SOLN
INTRAMUSCULAR | Status: AC
  Filled 2019-07-26: qty 5

## 2019-07-26 MED ORDER — EPHEDRINE 5 MG/ML INJ
INTRAVENOUS | Status: AC
  Filled 2019-07-26: qty 10

## 2019-07-26 MED ORDER — FENTANYL CITRATE (PF) 100 MCG/2ML IJ SOLN
INTRAMUSCULAR | Status: DC | PRN
  Administered 2019-07-26 (×5): 50 ug via INTRAVENOUS

## 2019-07-26 MED ORDER — HYDROMORPHONE HCL 1 MG/ML IJ SOLN
0.5000 mg | INTRAMUSCULAR | Status: DC | PRN
  Administered 2019-07-26: 0.5 mg via INTRAVENOUS
  Filled 2019-07-26: qty 1

## 2019-07-26 MED ORDER — SERTRALINE HCL 100 MG PO TABS
100.0000 mg | ORAL_TABLET | Freq: Every day | ORAL | Status: DC
  Administered 2019-07-27 – 2019-07-28 (×2): 100 mg via ORAL
  Filled 2019-07-26 (×3): qty 1

## 2019-07-26 MED ORDER — ROCURONIUM BROMIDE 50 MG/5ML IV SOSY
PREFILLED_SYRINGE | INTRAVENOUS | Status: DC | PRN
  Administered 2019-07-26: 50 mg via INTRAVENOUS

## 2019-07-26 MED ORDER — BUPIVACAINE HCL (PF) 0.5 % IJ SOLN
INTRAMUSCULAR | Status: AC
  Filled 2019-07-26: qty 30

## 2019-07-26 MED ORDER — METOCLOPRAMIDE HCL 5 MG PO TABS: 5.0000 mg | ORAL_TABLET | Freq: Three times a day (TID) | ORAL | Status: DC | PRN

## 2019-07-26 MED ORDER — CHLORHEXIDINE GLUCONATE 4 % EX LIQD: 60.0000 mL | Freq: Once | CUTANEOUS | Status: DC

## 2019-07-26 MED ORDER — OXYCODONE HCL 5 MG PO TABS
5.0000 mg | ORAL_TABLET | Freq: Once | ORAL | Status: AC | PRN
  Administered 2019-07-26: 13:00:00 5 mg via ORAL

## 2019-07-26 MED ORDER — EPINEPHRINE PF 1 MG/ML IJ SOLN
INTRAMUSCULAR | Status: AC
  Filled 2019-07-26: qty 1

## 2019-07-26 MED ORDER — CEFAZOLIN SODIUM-DEXTROSE 1-4 GM/50ML-% IV SOLN
INTRAVENOUS | Status: AC
  Filled 2019-07-26: qty 50

## 2019-07-26 MED ORDER — ROCURONIUM BROMIDE 10 MG/ML (PF) SYRINGE
PREFILLED_SYRINGE | INTRAVENOUS | Status: AC
  Filled 2019-07-26: qty 20

## 2019-07-26 MED ORDER — VANCOMYCIN HCL 1000 MG IV SOLR
INTRAVENOUS | Status: DC | PRN
  Administered 2019-07-26: 1000 mg via TOPICAL

## 2019-07-26 MED ORDER — 0.9 % SODIUM CHLORIDE (POUR BTL) OPTIME
TOPICAL | Status: DC | PRN
  Administered 2019-07-26 (×6): 1000 mL

## 2019-07-26 MED ORDER — CHLORTHALIDONE 25 MG PO TABS
25.0000 mg | ORAL_TABLET | Freq: Every day | ORAL | Status: DC
  Administered 2019-07-27 – 2019-07-28 (×2): 25 mg via ORAL
  Filled 2019-07-26 (×2): qty 1

## 2019-07-26 MED ORDER — LIDOCAINE 2% (20 MG/ML) 5 ML SYRINGE
INTRAMUSCULAR | Status: DC | PRN
  Administered 2019-07-26 (×2): 50 mg via INTRAVENOUS

## 2019-07-26 MED ORDER — BUPIVACAINE HCL (PF) 0.5 % IJ SOLN
INTRAMUSCULAR | Status: DC | PRN
  Administered 2019-07-26: 15 mL via PERINEURAL

## 2019-07-26 MED ORDER — CEFAZOLIN SODIUM 1 G IJ SOLR
INTRAMUSCULAR | Status: AC
  Filled 2019-07-26: qty 20

## 2019-07-26 MED ORDER — METHOCARBAMOL 500 MG PO TABS
500.0000 mg | ORAL_TABLET | Freq: Four times a day (QID) | ORAL | Status: DC | PRN
  Administered 2019-07-26: 13:00:00 500 mg via ORAL

## 2019-07-26 MED ORDER — INSULIN ASPART 100 UNIT/ML ~~LOC~~ SOLN
0.0000 [IU] | Freq: Three times a day (TID) | SUBCUTANEOUS | Status: DC
  Administered 2019-07-26: 5 [IU] via SUBCUTANEOUS
  Administered 2019-07-27: 2 [IU] via SUBCUTANEOUS
  Administered 2019-07-27: 6 [IU] via SUBCUTANEOUS
  Administered 2019-07-27: 5 [IU] via SUBCUTANEOUS
  Administered 2019-07-28: 3 [IU] via SUBCUTANEOUS

## 2019-07-26 MED ORDER — GABAPENTIN 300 MG PO CAPS
300.0000 mg | ORAL_CAPSULE | Freq: Three times a day (TID) | ORAL | Status: DC
  Administered 2019-07-26 – 2019-07-28 (×6): 300 mg via ORAL
  Filled 2019-07-26 (×6): qty 1

## 2019-07-26 MED ORDER — METHOCARBAMOL 1000 MG/10ML IJ SOLN
500.0000 mg | Freq: Four times a day (QID) | INTRAVENOUS | Status: DC | PRN
  Filled 2019-07-26: qty 5

## 2019-07-26 MED ORDER — MENTHOL 3 MG MT LOZG: 1.0000 | LOZENGE | OROMUCOSAL | Status: DC | PRN

## 2019-07-26 MED ORDER — ONDANSETRON HCL 4 MG/2ML IJ SOLN
INTRAMUSCULAR | Status: DC | PRN
  Administered 2019-07-26: 4 mg via INTRAVENOUS

## 2019-07-26 MED ORDER — OXYCODONE HCL 5 MG/5ML PO SOLN: 5.0000 mg | Freq: Once | ORAL | Status: AC | PRN

## 2019-07-26 MED ORDER — PROPOFOL 500 MG/50ML IV EMUL
INTRAVENOUS | Status: DC | PRN
  Administered 2019-07-26: 50 ug/kg/min via INTRAVENOUS

## 2019-07-26 SURGICAL SUPPLY — 91 items
AID PSTN UNV HD RSTRNT DISP (MISCELLANEOUS) ×1
ALCOHOL 70% 16 OZ (MISCELLANEOUS) ×2 IMPLANT
APL PRP STRL LF DISP 70% ISPRP (MISCELLANEOUS) ×2
AUG COMP REV MI TAPER ADAPTER (Joint) ×2 IMPLANT
AUGMENT COMP REV MI TAPR ADPTR (Joint) IMPLANT
BEARING HUMERAL SHLDER 36M STD (Shoulder) IMPLANT
BIT DRILL 2.7 W/STOP DISP (BIT) ×1 IMPLANT
BIT DRILL QUICK REL 1/8 2PK SL (DRILL) IMPLANT
BIT DRILL TWIST 2.7 (BIT) ×1 IMPLANT
BLADE SAW SGTL 13X75X1.27 (BLADE) ×2 IMPLANT
BRNG HUM STD 36 RVRS SHLDR (Shoulder) ×1 IMPLANT
BSPLAT GLND SM AUG TPR ADPR (Joint) ×1 IMPLANT
CHLORAPREP W/TINT 26 (MISCELLANEOUS) ×4 IMPLANT
COVER SURGICAL LIGHT HANDLE (MISCELLANEOUS) ×2 IMPLANT
COVER WAND RF STERILE (DRAPES) ×2 IMPLANT
DRAPE INCISE IOBAN 66X45 STRL (DRAPES) ×2 IMPLANT
DRAPE U-SHAPE 47X51 STRL (DRAPES) ×4 IMPLANT
DRESSING AQUACEL AG SP 3.5X10 (GAUZE/BANDAGES/DRESSINGS) IMPLANT
DRILL QUICK RELEASE 1/8 INCH (DRILL) ×2
DRSG AQUACEL AG ADV 3.5X10 (GAUZE/BANDAGES/DRESSINGS) ×2 IMPLANT
DRSG AQUACEL AG SP 3.5X10 (GAUZE/BANDAGES/DRESSINGS) ×2
ELECT BLADE 4.0 EZ CLEAN MEGAD (MISCELLANEOUS)
ELECT REM PT RETURN 9FT ADLT (ELECTROSURGICAL) ×2
ELECTRODE BLDE 4.0 EZ CLN MEGD (MISCELLANEOUS) IMPLANT
ELECTRODE REM PT RTRN 9FT ADLT (ELECTROSURGICAL) ×1 IMPLANT
GAUZE SPONGE 4X4 12PLY STRL LF (GAUZE/BANDAGES/DRESSINGS) ×2 IMPLANT
GLENOID SPHERE 36MM CVD +3 (Orthopedic Implant) ×1 IMPLANT
GLOVE BIOGEL PI IND STRL 7.0 (GLOVE) ×1 IMPLANT
GLOVE BIOGEL PI IND STRL 8 (GLOVE) ×1 IMPLANT
GLOVE BIOGEL PI INDICATOR 7.0 (GLOVE) ×1
GLOVE BIOGEL PI INDICATOR 8 (GLOVE) ×1
GLOVE ECLIPSE 7.0 STRL STRAW (GLOVE) ×2 IMPLANT
GLOVE ECLIPSE 8.0 STRL XLNG CF (GLOVE) ×2 IMPLANT
GOWN STRL REUS W/ TWL LRG LVL3 (GOWN DISPOSABLE) ×2 IMPLANT
GOWN STRL REUS W/ TWL XL LVL3 (GOWN DISPOSABLE) ×1 IMPLANT
GOWN STRL REUS W/TWL LRG LVL3 (GOWN DISPOSABLE) ×4
GOWN STRL REUS W/TWL XL LVL3 (GOWN DISPOSABLE) ×2
GUIDE MODEL REV SHLD RT (ORTHOPEDIC DISPOSABLE SUPPLIES) ×1 IMPLANT
HYDROGEN PEROXIDE 16OZ (MISCELLANEOUS) ×2 IMPLANT
JET LAVAGE IRRISEPT WOUND (IRRIGATION / IRRIGATOR) ×2
KIT BASIN OR (CUSTOM PROCEDURE TRAY) ×2 IMPLANT
KIT TURNOVER KIT B (KITS) ×2 IMPLANT
LAVAGE JET IRRISEPT WOUND (IRRIGATION / IRRIGATOR) ×1 IMPLANT
LOOP VESSEL MAXI BLUE (MISCELLANEOUS) ×2 IMPLANT
MANIFOLD NEPTUNE II (INSTRUMENTS) ×2 IMPLANT
NDL HYPO 25GX1X1/2 BEV (NEEDLE) IMPLANT
NDL SUT 6 .5 CRC .975X.05 MAYO (NEEDLE) ×1 IMPLANT
NEEDLE HYPO 25GX1X1/2 BEV (NEEDLE) IMPLANT
NEEDLE MAYO TAPER (NEEDLE) ×2
NS IRRIG 1000ML POUR BTL (IV SOLUTION) ×2 IMPLANT
PACK SHOULDER (CUSTOM PROCEDURE TRAY) ×2 IMPLANT
PAD ARMBOARD 7.5X6 YLW CONV (MISCELLANEOUS) ×4 IMPLANT
PIN THREADED REVERSE (PIN) ×2 IMPLANT
REAMER GUIDE BUSHING SURG DISP (MISCELLANEOUS) ×2 IMPLANT
REAMER GUIDE W/SCREW AUG (MISCELLANEOUS) ×2 IMPLANT
RESTRAINT HEAD UNIVERSAL NS (MISCELLANEOUS) ×2 IMPLANT
RETRIEVER SUT HEWSON (MISCELLANEOUS) ×2 IMPLANT
SCREW BONE LOCKING 4.75X35X3.5 (Screw) ×1 IMPLANT
SCREW BONE LOCKING 4.75X40X3.5 (Screw) ×1 IMPLANT
SCREW BONE STRL 6.5MMX30MM (Screw) ×1 IMPLANT
SCREW LOCKING 4.75MMX15MM (Screw) ×2 IMPLANT
SHOULDER HUMERAL BEAR 36M STD (Shoulder) ×2 IMPLANT
SLING ARM IMMOBILIZER LRG (SOFTGOODS) ×2 IMPLANT
SOL PREP POV-IOD 4OZ 10% (MISCELLANEOUS) ×2 IMPLANT
SPONGE LAP 18X18 RF (DISPOSABLE) ×3 IMPLANT
STEM MINI LONG 15MM 83MM (Stem) ×1 IMPLANT
STRIP CLOSURE SKIN 1/2X4 (GAUZE/BANDAGES/DRESSINGS) ×2 IMPLANT
SUCTION FRAZIER HANDLE 10FR (MISCELLANEOUS) ×2
SUCTION TUBE FRAZIER 10FR DISP (MISCELLANEOUS) ×1 IMPLANT
SUT BROADBAND TAPE 2PK 1.5 (SUTURE) ×2 IMPLANT
SUT FIBERWIRE #2 38 T-5 BLUE (SUTURE)
SUT MAXBRAID (SUTURE) IMPLANT
SUT MNCRL AB 3-0 PS2 18 (SUTURE) ×2 IMPLANT
SUT SILK 2 0 TIES 10X30 (SUTURE) ×2 IMPLANT
SUT VIC AB 0 CT1 27 (SUTURE) ×4
SUT VIC AB 0 CT1 27XBRD ANBCTR (SUTURE) ×2 IMPLANT
SUT VIC AB 1 CT1 27 (SUTURE) ×2
SUT VIC AB 1 CT1 27XBRD ANBCTR (SUTURE) ×1 IMPLANT
SUT VIC AB 2-0 CT1 27 (SUTURE) ×4
SUT VIC AB 2-0 CT1 TAPERPNT 27 (SUTURE) ×2 IMPLANT
SUT VICRYL 0 UR6 27IN ABS (SUTURE) ×4 IMPLANT
SUTURE FIBERWR #2 38 T-5 BLUE (SUTURE) IMPLANT
SUTURE TAPE 1.3 40 TPR END (SUTURE) IMPLANT
SUTURETAPE 1.3 40 TPR END (SUTURE)
SYR CONTROL 10ML LL (SYRINGE) IMPLANT
TOWEL GREEN STERILE (TOWEL DISPOSABLE) ×2 IMPLANT
TOWEL GREEN STERILE FF (TOWEL DISPOSABLE) ×2 IMPLANT
TRAY FOL W/BAG SLVR 16FR STRL (SET/KITS/TRAYS/PACK) IMPLANT
TRAY FOLEY W/BAG SLVR 16FR LF (SET/KITS/TRAYS/PACK)
TRAY HUM REV SHOULDER STD +6 (Shoulder) ×1 IMPLANT
WATER STERILE IRR 1000ML POUR (IV SOLUTION) ×2 IMPLANT

## 2019-07-26 NOTE — Progress Notes (Signed)
1535 Received pt from PACU, A&O x4. Right shoulder incision with Aquacel dressing dry and intact. Right arm in a sling. Good and strong grips to both hands, tingling sensation to right fingers.

## 2019-07-26 NOTE — Op Note (Signed)
NAME: ACEY, DOBBERSTEIN MEDICAL RECORD [id number] ACCOUNT [id number] DATE OF BIRTH:21-Feb-1946 FACILITY: [facility] LOCATION: [facility] PHYSICIAN:Kendre Jacinto Randel Pigg, MD  OPERATIVE REPORT  DATE OF PROCEDURE:  07/26/2019  PREOPERATIVE DIAGNOSIS:  Right shoulder arthritis.  POSTOPERATIVE DIAGNOSIS:  Right shoulder arthritis.  PROCEDURE:  Right reverse shoulder replacement using [employer] glenosphere with a small superior augment, standard baseplate with a small augment superiorly along with 36 mm glenosphere, +3 lateralization, 15 mm mini humeral stem with +6 mm  offset humeral tray and standard polyethylene liner.  SURGEON:  Meredith Pel, MD  ASSISTANT:  Annie Main, PA  INDICATIONS:  The patient is a 74 year old patient with right shoulder pain and end-stage arthritis.  She presents now for operative management of shoulder arthritis after explanation of risks and benefits.  PROCEDURE IN DETAIL:  The patient was brought to the operating room where general anesthetic was induced.  Preoperative antibiotics administered.  Timeout was called.  The right shoulder was examined under anesthesia and was found to have pretty good  passive range of motion with external rotation at 15 degrees of abduction to about 45 degrees.  The right shoulder, arm and hand pre-scrubbed with hydrogen peroxide, followed by Betadine, which was allowed to air dry, then DuraPrep solution.  Next, the  head was in neutral position.  Timeout was called.  Right shoulder was then draped and Ioban was used to cover the operative field.  The incision was made anteriorly in the deltopectoral interval, beginning at the coracoid process and extending distally  2 cm lateral to the axillary crease.  Skin and subcutaneous tissue were sharply divided.  Cephalic vein mobilized medially.  Deltopectoral interval entered.  The biceps tendon was tenodesed to the pectoralis major tendon.  The rotator interval was opened  up to the  base of the coracoid.  The circumflex vessels were ligated.  Subdeltoid adhesions were removed.  A manual elevation of the anterior attachment of the deltoid was performed to relax the deltoid.  Next, the axillary nerve was identified and a  vessel loop placed around it.  It was protected at all times during the case.  At this time, the subscapularis was detached using a 15 blade from the lesser tuberosity.  The quality of the tendon was generally poor.  The anterior edge of the  supraspinatus was also a poor quality tendon and was essentially completely torn over about a 1 cm segment.  Next, the subscap and the inferior capsule were carefully detached around to the 5 o'clock position on the humeral face.  Osteophytes were  removed.  The humeral head was delivered.  The superior entry point was obtained.  Sequential reaming was performed.  The head was cut in about 25 degrees of retroversion.  The cut was taken down to just medial to the rotator cuff attachment.  Next, a  broach was placed and a cap was placed on the humerus and then the humerus was retracted with the posterior retractor.  The capsule was released, with care being taken to avoid injury to the axillary nerve.  Circumferential release of the capsule was  performed and the labrum under direct visualization.  Anterior retractor was placed.  Glenoid was visualized.  Using patient-specific instrumentation, the guide pin was placed in the appropriate location for the reverse shoulder replacement.  This  incision was made based primarily on tissue quality.  Superior rotator cuff quality as well as the subscapularis quality was marginal at best.  Although there was no frank tear  of the subscap, the supraspinatus did have a tear of the leading edge.  Next,  the glenoid was reamed and superior reaming was performed for the augment.  The bone quality was excellent.  The baseplate was placed with excellent purchase of all 4 of the central compression  screws and 4 peripheral locking screws obtained.  A +3  glenosphere was then placed.  Attention was then directed towards the humerus.  Broaching was performed and a 15 trial was placed.  The patient had very good stability with the +6 offset standard tray.  This was difficult to reduce and had very good  stability to extension, adduction and anterior directed force.  Next, the shoulder was redislocated.  Attention was then directed towards the humeral stem.  Drill holes were placed through the lesser tuberosity for later subscap attachment.  Thorough  irrigation was performed with IrriSept solution after the incision, as well as multiple times during the case for infection prevention purposes.  True stem was placed in the same stability parameters were maintained with the +6 offset tray, along with  the standard polyethylene bearing.  The shoulder was reduced.  The vessel loop was removed from the axillary nerve, which was palpable and intact.  Thorough irrigation was then performed.  Subscap was then repaired in about 30 degrees of external  rotation using 4 suture tapes.  A good repair was achieved.  Rotator interval also closed with the arm in external rotation.  Next, vancomycin powder was placed within the joint and then irrigation was performed and vancomycin powder was placed on top of  the joint within the deltopectoral interval.  The deltopectoral interval was then closed using #1 Vicryl suture, followed by interrupted inverted 0 Vicryl suture, 2-0 Vicryl suture and a 3-0 Monocryl.  Steri-Strips and Aquacel dressing applied.  The  patient tolerated the procedure well without immediate complication, placed in a sling.  Luke's assistance was required at all times during the case for retraction, opening and closing.  His assistance was a medical necessity.  VN/NUANCE  D:07/26/2019 T:07/26/2019 JOB:[id number]

## 2019-07-26 NOTE — H&P (Signed)
[name] is an 74 y.o. female.   Chief Complaint: Right shoulder pain HPI: [name] is a 74 year old female with right shoulder pain.  She has a long history of pain and disability in the right shoulder.  No history of trauma.  CT and MRI scan show intact rotator cuff with significant and severe osteoarthritis of the shoulder.  She has failed conservative measures.  She did undergo 3-day benzyl peroxide prescrub and prep protocol.  Presents now for operative management of her right shoulder arthritis with right shoulder replacement.  Past Medical History:  Diagnosis Date  . Anxiety   . Arthritis    osteoarthritis left knee, right shoulder "locks" occ.  . Diabetes mellitus without complication ([place])   . H/O seasonal allergies   . History of hiatal hernia   . Hypertension   . Pneumonia    as a child  . Sleep apnea    no cpap-continues to be evaluated    Past Surgical History:  Procedure Laterality Date  . ABDOMINAL HYSTERECTOMY    . BACK SURGERY     lumbar fusion  . CATARACT EXTRACTION, BILATERAL    . CHOLECYSTECTOMY    . COLONOSCOPY WITH PROPOFOL N/A 07/30/2017   Procedure: COLONOSCOPY WITH PROPOFOL;  Surgeon: Juanita Craver, MD;  Location: [facility] ENDOSCOPY;  Service: Endoscopy;  Laterality: N/A;  . EXPLORATORY LAPAROTOMY     ovaries freed from intestines.  Marland Kitchen FOOT SURGERY     retained hardware.  Marland Kitchen KNEE ARTHROSCOPY Right    scope surgery  . TONSILLECTOMY    . TOTAL KNEE ARTHROPLASTY Left 02/16/2015   Procedure: LEFT TOTAL KNEE ARTHROPLASTY;  Surgeon: Sydnee Cabal, MD;  Location: [facility] ORS;  Service: Orthopedics;  Laterality: Left;  . WRIST SURGERY Left    x2 "cysts"    History reviewed. No pertinent family history. Social History:  reports that she quit smoking about 46 years ago. Her smoking use included cigarettes. She has a 5.00 pack-year smoking history. She has never used smokeless tobacco. She reports current alcohol use. She reports that she does not use  drugs.  Allergies:  Allergies  Allergen Reactions  . Prednisone Shortness Of Breath  . Metformin Nausea And Vomiting    Too much diarrhea     Medications Prior to Admission  Medication Sig Dispense Refill  . amLODipine (NORVASC) 5 MG tablet Take 5 mg by mouth daily.     . chlorthalidone (HYGROTON) 25 MG tablet Take 25 mg by mouth daily.     . fluticasone (FLONASE) 50 MCG/ACT nasal spray Place 1-2 sprays into both nostrils daily as needed for allergies or rhinitis.    . Insulin Glargine (LANTUS SOLOSTAR) 100 UNIT/ML Solostar Pen Inject 31 Units into the skin daily at 10 pm.     . sertraline (ZOLOFT) 100 MG tablet Take 100 mg by mouth daily.    Marland Kitchen topiramate (TOPAMAX) 25 MG tablet Take 25 mg by mouth at bedtime.    . methocarbamol (ROBAXIN) 500 MG tablet Take 1 tablet (500 mg total) by mouth 2 (two) times daily as needed. (Patient not taking: Reported on 07/18/2019) 20 tablet 0    Results for orders placed or performed during the hospital encounter of 07/26/19 (from the past 48 hour(s))  Glucose, capillary     Status: Abnormal   Collection Time: 07/26/19  6:18 AM  Result Value Ref Range   Glucose-Capillary 170 (H) 70 - 99 mg/dL    Comment: Glucose reference range applies only to samples taken after fasting  for at least 8 hours.  CBC     Status: None   Collection Time: 07/26/19  6:43 AM  Result Value Ref Range   WBC 8.5 4.0 - 10.5 K/uL   RBC 4.87 3.87 - 5.11 MIL/uL   Hemoglobin 13.6 12.0 - 15.0 g/dL   HCT 41.8 36.0 - 46.0 %   MCV 85.8 80.0 - 100.0 fL   MCH 27.9 26.0 - 34.0 pg   MCHC 32.5 30.0 - 36.0 g/dL   RDW 14.2 11.5 - 15.5 %   Platelets 304 150 - 400 K/uL   nRBC 0.0 0.0 - 0.2 %    Comment: Performed at [facility], [place] [address] [place], [place] [address]   No results found.  Review of Systems  Musculoskeletal: Positive for arthralgias.  All other systems reviewed and are negative.   Blood pressure 132/66, pulse 74, temperature 98.4 F (36.9 C), temperature  source Oral, resp. rate 20, height 5\' 7"  (1.702 m), weight 127 kg, SpO2 99 %. Physical Exam  Constitutional: She appears well-developed.  HENT:  Head: Normocephalic.  Eyes: Pupils are equal, round, and reactive to light.  Cardiovascular: Normal rate.  Respiratory: Effort normal.  Musculoskeletal:     Cervical back: Normal range of motion.  Neurological: She is alert.  Skin: Skin is warm.  Psychiatric: She has a normal mood and affect.  Ortho exam demonstrates functional deltoid.  She has external rotation of 15 degrees abduction to about 15 degrees.  Isolated forward flexion and abduction about 90.  Rotator cuff strength is good to infraspinatus supraspinatus and subscap muscle testing.  Motor sensory function of the hand is intact.  Assessment/Plan Impression is right shoulder arthritis.  Plan is right shoulder replacement.  Whether or not that is a reverse or anatomic shoulder replacement really depends on the status of the cuff and the mobility of the subscapularis tendon.  The risk and benefits of shoulder replacement are discussed include not limited to infection nerve vessel damage dislocation as well as potential need for more surgery.  Patient understands risk benefits and wishes to proceed.  All questions answered  Anderson Malta, MD 07/26/2019, 7:19 AM

## 2019-07-26 NOTE — Transfer of Care (Signed)
Immediate Anesthesia Transfer of Care Note  Patient: [name]  Procedure(s) Performed: RIGHT TOTAL SHOULDER ARTHROPLASTY (Right Shoulder)  Patient Location: PACU  Anesthesia Type:General and Regional  Level of Consciousness: awake, alert , oriented and sedated  Airway & Oxygen Therapy: Patient Spontanous Breathing and Patient connected to face mask oxygen  Post-op Assessment: Report given to RN, Post -op Vital signs reviewed and stable and Patient moving all extremities  Post vital signs: Reviewed and stable  Last Vitals:  Vitals Value Taken Time  BP 139/98 07/26/19 1245  Temp 36.7 C 07/26/19 1215  Pulse 97 07/26/19 1258  Resp 15 07/26/19 1258  SpO2 92 % 07/26/19 1258  Vitals shown include unvalidated device data.  Last Pain:  Vitals:   07/26/19 1245  TempSrc:   PainSc: 2          Complications: No apparent anesthesia complications

## 2019-07-26 NOTE — Brief Op Note (Signed)
   07/26/2019  12:09 PM  PATIENT:  [name]  74 y.o. female  PRE-OPERATIVE DIAGNOSIS:  right shoulder replacement  POST-OPERATIVE DIAGNOSIS:  right shoulder replacement  PROCEDURE:  Procedure(s): RIGHT TOTAL SHOULDER ARTHROPLASTY  SURGEON:  Surgeon(s): Meredith Pel, MD  ASSISTANT: magnant pa  ANESTHESIA:   general  EBL: 250 ml    Total I/O In: 1100 [I.V.:1100] Out: 300 [Blood:300]  BLOOD ADMINISTERED: none  DRAINS: none   LOCAL MEDICATIONS USED:  none  SPECIMEN:  No Specimen  COUNTS:  YES  TOURNIQUET:  * No tourniquets in log *  DICTATION: .Other Dictation: Dictation Number [id number]  PLAN OF CARE: Admit for overnight observation  PATIENT DISPOSITION:  PACU - hemodynamically stable

## 2019-07-26 NOTE — Anesthesia Procedure Notes (Signed)
Procedure Name: Intubation Date/Time: 07/26/2019 7:42 AM Performed by: Scheryl Darter, CRNA Pre-anesthesia Checklist: Patient identified, Emergency Drugs available, Suction available and Patient being monitored Patient Re-evaluated:Patient Re-evaluated prior to induction Oxygen Delivery Method: Circle System Utilized Preoxygenation: Pre-oxygenation with 100% oxygen Induction Type: IV induction and Rapid sequence Ventilation: Mask ventilation without difficulty Grade View: Grade I Tube type: Oral Tube size: 7.5 mm Number of attempts: 1 Airway Equipment and Method: Stylet and Oral airway Placement Confirmation: ETT inserted through vocal cords under direct vision,  positive ETCO2 and breath sounds checked- equal and bilateral Secured at: 23 cm Tube secured with: Tape Dental Injury: Teeth and Oropharynx as per pre-operative assessment

## 2019-07-26 NOTE — Anesthesia Preprocedure Evaluation (Signed)
Anesthesia Evaluation  Patient identified by MRN, date of birth, ID band Patient awake    Reviewed: Allergy & Precautions, H&P , NPO status , Patient's Chart, lab work & pertinent test results  Airway Mallampati: II   Neck ROM: full    Dental   Pulmonary sleep apnea , former smoker,    breath sounds clear to auscultation       Cardiovascular hypertension,  Rhythm:regular Rate:Normal     Neuro/Psych PSYCHIATRIC DISORDERS Anxiety  Neuromuscular disease    GI/Hepatic hiatal hernia,   Endo/Other  diabetes, Type 2Morbid obesity  Renal/GU      Musculoskeletal  (+) Arthritis ,   Abdominal   Peds  Hematology   Anesthesia Other Findings   Reproductive/Obstetrics                             Anesthesia Physical Anesthesia Plan  ASA: III  Anesthesia Plan: General   Post-op Pain Management:  Regional for Post-op pain   Induction: Intravenous  PONV Risk Score and Plan: 3 and Ondansetron, Dexamethasone and Treatment may vary due to age or medical condition  Airway Management Planned: Oral ETT  Additional Equipment:   Intra-op Plan:   Post-operative Plan: Extubation in OR  Informed Consent: I have reviewed the patients History and Physical, chart, labs and discussed the procedure including the risks, benefits and alternatives for the proposed anesthesia with the patient or authorized representative who has indicated his/her understanding and acceptance.       Plan Discussed with: CRNA, Anesthesiologist and Surgeon  Anesthesia Plan Comments:         Anesthesia Quick Evaluation

## 2019-07-26 NOTE — Anesthesia Procedure Notes (Signed)
Anesthesia Regional Block: Interscalene brachial plexus block   Pre-Anesthetic Checklist: ,, timeout performed, Correct Patient, Correct Site, Correct Laterality, Correct Procedure, Correct Position, site marked, Risks and benefits discussed,  Surgical consent,  Pre-op evaluation,  At surgeon's request and post-op pain management  Laterality: Right  Prep: chloraprep       Needles:  Injection technique: Single-shot  Needle Type: Echogenic Stimulator Needle     Needle Length: 5cm  Needle Gauge: 22     Additional Needles:   Procedures:, nerve stimulator,,,,,,,   Nerve Stimulator or Paresthesia:  Response: biceps flexion, 0.45 mA,   Additional Responses:   Narrative:  Start time: 07/26/2019 7:01 AM End time: 07/26/2019 7:12 AM Injection made incrementally with aspirations every 5 mL.  Performed by: Personally  Anesthesiologist: Albertha Ghee, MD  Additional Notes: Functioning IV was confirmed and monitors were applied.  A 18mm 22ga Arrow echogenic stimulator needle was used. Sterile prep and drape,hand hygiene and sterile gloves were used.  Negative aspiration and negative test dose prior to incremental administration of local anesthetic. The patient tolerated the procedure well.  Ultrasound guidance: relevent anatomy identified, needle position confirmed, local anesthetic spread visualized around nerve(s), vascular puncture avoided.  Image printed for medical record.

## 2019-07-27 ENCOUNTER — Encounter: Payer: Self-pay | Admitting: *Deleted

## 2019-07-27 DIAGNOSIS — M19011 Primary osteoarthritis, right shoulder: Secondary | ICD-10-CM | POA: Diagnosis not present

## 2019-07-27 LAB — GLUCOSE, CAPILLARY
Glucose-Capillary: 126 mg/dL — ABNORMAL HIGH (ref 70–99)
Glucose-Capillary: 182 mg/dL — ABNORMAL HIGH (ref 70–99)
Glucose-Capillary: 223 mg/dL — ABNORMAL HIGH (ref 70–99)
Glucose-Capillary: 206 mg/dL — ABNORMAL HIGH (ref 70–99)

## 2019-07-27 LAB — HEMOGLOBIN A1C
Hgb A1c MFr Bld: 7.5 % — ABNORMAL HIGH (ref 4.8–5.6)
Mean Plasma Glucose: 169 mg/dL

## 2019-07-27 NOTE — Plan of Care (Signed)
  Problem: Education: Goal: Knowledge of General Education information will improve Description: Including pain rating scale, medication(s)/side effects and non-pharmacologic comfort measures Outcome: Progressing   Problem: Clinical Measurements: Goal: Respiratory complications will improve Outcome: Progressing Note: On room air   Problem: Activity: Goal: Risk for activity intolerance will decrease Outcome: Progressing Note: Up in room and to bathroom, standby assist, tolerating well, sling on right arm   Problem: Nutrition: Goal: Adequate nutrition will be maintained Outcome: Progressing   Problem: Coping: Goal: Level of anxiety will decrease Outcome: Progressing   Problem: Elimination: Goal: Will not experience complications related to urinary retention Outcome: Progressing   Problem: Pain Managment: Goal: General experience of comfort will improve Outcome: Progressing

## 2019-07-27 NOTE — Anesthesia Postprocedure Evaluation (Signed)
Anesthesia Post Note  Patient: [name]  Procedure(s) Performed: RIGHT TOTAL SHOULDER ARTHROPLASTY (Right Shoulder)     Patient location during evaluation: PACU Anesthesia Type: Regional and General Level of consciousness: awake and alert Pain management: pain level controlled Vital Signs Assessment: post-procedure vital signs reviewed and stable Respiratory status: spontaneous breathing, nonlabored ventilation, respiratory function stable and patient connected to nasal cannula oxygen Cardiovascular status: blood pressure returned to baseline and stable Postop Assessment: no apparent nausea or vomiting Anesthetic complications: no    Last Vitals:  Vitals:   07/27/19 0405 07/27/19 0755  BP: 110/65 111/70  Pulse: 62 63  Resp: 15 16  Temp: 37 C 36.8 C  SpO2: 97%     Last Pain:  Vitals:   07/27/19 0755  TempSrc: Oral  PainSc:                  [place]

## 2019-07-27 NOTE — Evaluation (Signed)
Occupational Therapy Evaluation Patient Details Name: [name] MRN: [id number] DOB: Apr 03, 1946 Today's Date: 07/27/2019    History of Present Illness [name] is a 74 year old female with PMH of DM, HTN, and anxiety. Pt s/p right total shoulder arthroplasty.     Clinical Impression   PTA, pt was living at home with her daughter, grandaughter and mother. 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 Pt reports she was independent with ADL/IADL and functional mobility. Pt currently requires minA for UB dressing, modA for LB dressing, and minA for functional mobility with single UE support. Pt reports she has a cane at home and her family will be available to assist her as needed. Pt would benefit from acute OT to address established goals to facilitate safe D/C to venue listed below. At this time, recommend follow-up therapies per surgeons recommendation. Will continue to follow acutely.     Follow Up Recommendations  Follow surgeon's recommendation for DC plan and follow-up therapies    Equipment Recommendations  None recommended by OT    Recommendations for Other Services       Precautions / Restrictions Precautions Precautions: Shoulder;Fall Type of Shoulder Precautions: NWB;AROM through elbow, wrist, hand;pendulums okay; Shoulder Interventions: Shoulder sling/immobilizer;At all times;Off for dressing/bathing/exercises Precaution Booklet Issued: Yes (comment) Precaution Comments: reviewed provided handout Required Braces or Orthoses: Sling Restrictions Weight Bearing Restrictions: Yes RUE Weight Bearing: Non weight bearing      Mobility Bed Mobility               General bed mobility comments: pt sitting in recliner upon arrival  Transfers Overall transfer level: Needs assistance Equipment used: 1 person hand held assist Transfers: Sit to/from Stand Sit to Stand: Min assist         General transfer comment: minA to stabilize    Balance  Overall balance assessment: Needs assistance Sitting-balance support: No upper extremity supported;Feet supported Sitting balance-Leahy Scale: Fair     Standing balance support: Single extremity supported;During functional activity Standing balance-Leahy Scale: Poor Standing balance comment: reliant on at least single UE support in standing                           ADL either performed or assessed with clinical judgement   ADL Overall ADL's : Needs assistance/impaired Eating/Feeding: Set up;Sitting   Grooming: Set up;Sitting   Upper Body Bathing: Minimal assistance;Sitting   Lower Body Bathing: Minimal assistance;Sit to/from stand   Upper Body Dressing : Minimal assistance;Sitting Upper Body Dressing Details (indicate cue type and reason): minA to don/doff sling;educated pt on compensatory strategies for dressing right side first Lower Body Dressing: Moderate assistance;Sit to/from stand Lower Body Dressing Details (indicate cue type and reason): modA to access feet Toilet Transfer: Minimal assistance;Ambulation   Toileting- Clothing Manipulation and Hygiene: Minimal assistance;Sit to/from stand       Functional mobility during ADLs: Minimal assistance;Cane       Vision         Perception     Praxis      Pertinent Vitals/Pain Pain Assessment: 0-10 Pain Score: 2  Pain Location: R shoulder Pain Descriptors / Indicators: Pressure;Sore Pain Intervention(s): Limited activity within patient's tolerance;Monitored during session;Repositioned     Hand Dominance Right   Extremity/Trunk Assessment Upper Extremity Assessment Upper Extremity Assessment: RUE deficits/detail RUE Deficits / Details: NWB;no AROM of shoulder;AROM of elbow, wrist, and hand WFL RUE Sensation: WNL RUE Coordination: decreased gross motor  Lower Extremity Assessment Lower Extremity Assessment: Defer to PT evaluation   Cervical / Trunk Assessment Cervical / Trunk Assessment:  Normal   Communication Communication Communication: No difficulties   Cognition Arousal/Alertness: Awake/alert Behavior During Therapy: WFL for tasks assessed/performed Overall Cognitive Status: Within Functional Limits for tasks assessed                                     General Comments  Pt reported minor "off" feeling with exercises, denied dizziness, BP 110/74 HR 69    Exercises Exercises: Shoulder Shoulder Exercises Pendulum Exercise: Seated;Right;5 reps Elbow Flexion: Right;AROM;10 reps;Seated Elbow Extension: Right;AROM;10 reps;Seated Wrist Flexion: Right;AROM;10 reps;Seated Wrist Extension: Right;AROM;10 reps;Seated Digit Composite Flexion: Right;AROM;10 reps;Seated Composite Extension: Right;AROM;10 reps;Seated Hand Exercises Forearm Supination: Right;AROM;10 reps;Seated Forearm Pronation: Right;AROM;10 reps;Seated   Shoulder Instructions Shoulder Instructions Donning/doffing shirt without moving shoulder: Minimal assistance Method for sponge bathing under operated UE: Minimal assistance Donning/doffing sling/immobilizer: Minimal assistance Correct positioning of sling/immobilizer: Minimal assistance Pendulum exercises (written home exercise program): Minimal assistance ROM for elbow, wrist and digits of operated UE: Supervision/safety Sling wearing schedule (on at all times/off for ADL's): Supervision/safety Proper positioning of operated UE when showering: Min-guard Positioning of UE while sleeping: Minimal assistance    Home Living Family/patient expects to be discharged to:: Private residence Living Arrangements: Children;Parent(daughter, mother (who has dementia)) Available Help at Discharge: Family Type of Home: House Home Access: Level entry     Home Layout: Able to live on main level with bedroom/bathroom     Bathroom Shower/Tub: Occupational psychologist: Standard     Home Equipment: Cane - single point   Additional  Comments: Also has assist from grandchildren      Prior Functioning/Environment Level of Independence: Independent        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; retired bus Nurse, mental health Problem List: Impaired balance (sitting and/or standing);Obesity;Impaired UE functional use;Pain;Decreased safety awareness      OT Treatment/Interventions: Self-care/ADL training;Therapeutic exercise;Therapeutic activities;DME and/or AE instruction;Patient/family education;Balance training    OT Goals(Current goals can be found in the care plan section) Acute Rehab OT Goals Patient Stated Goal: to go home OT Goal Formulation: With patient Time For Goal Achievement: 08/10/19 Potential to Achieve Goals: Good ADL Goals Pt Will Perform Grooming: with modified independence;standing;sitting Pt Will Perform Upper Body Dressing: with modified independence;sitting Pt Will Perform Lower Body Dressing: with modified independence;sit to/from stand;with adaptive equipment Pt/caregiver will Perform Home Exercise Program: Right Upper extremity;Increased ROM;With written HEP provided  OT Frequency: Min 2X/week   Barriers to D/C:            Co-evaluation              AM-PAC OT "6 Clicks" Daily Activity     Outcome Measure Help from another person eating meals?: A Little Help from another person taking care of personal grooming?: A Little Help from another person toileting, which includes using toliet, bedpan, or urinal?: A Little Help from another person bathing (including washing, rinsing, drying)?: A Little Help from another person to put on and taking off regular upper body clothing?: A Little Help from another person to put on and taking off regular lower body clothing?: A Lot 6 Click Score: 17   End of Session Equipment Utilized During Treatment: (sling) Nurse Communication: Mobility status  Activity Tolerance: Patient tolerated treatment well Patient left:  in chair;with call  bell/phone within reach  OT Visit Diagnosis: Other abnormalities of gait and mobility (R26.89);Pain Pain - Right/Left: Right Pain - part of body: Shoulder                Time: [id number] OT Time Calculation (min): 35 min Charges:  OT General Charges $OT Visit: 1 Visit OT Evaluation $OT Eval Moderate Complexity: 1 Mod OT Treatments $Self Care/Home Management : 8-22 mins  Helene Kelp OTR/L Acute Rehabilitation Services Office: [phone]   Wyn Forster 07/27/2019, 12:37 PM

## 2019-07-27 NOTE — Progress Notes (Signed)
  Subjective: [name] is a 74 y.o. female s/p right RSA.  They are POD1.  Pt's pain is controlled.  She mobilized well in PT but feels anxious about discharge on the first day of surgery. Block is wearing off.    Objective: Vital signs in last 24 hours: Temp:  [97.9 F (36.6 C)-98.6 F (37 C)] 98.4 F (36.9 C) (04/14 1531) Pulse Rate:  [62-83] 83 (04/14 1531) Resp:  [15-18] 18 (04/14 1531) BP: (106-111)/(61-70) 108/61 (04/14 1531) SpO2:  [91 %-100 %] 100 % (04/14 1531)  Intake/Output from previous day: 04/13 0701 - 04/14 0700 In: 1350 [P.O.:100; I.V.:1100] Out: 300 [Blood:300] Intake/Output this shift: Total I/O In: 2235 [P.O.:820; I.V.:1415] Out: -   Exam:  No gross blood or drainage overlying the dressing 2+ radial pulse Sensation intact distally in the right hand.  Diminished in thumb Able to extend the right wrist and thumb.  IP flexion and finger abduction intact   Labs: Recent Labs    07/26/19 0643  HGB 13.6   Recent Labs    07/26/19 0643  WBC 8.5  RBC 4.87  HCT 41.8  PLT 304   Recent Labs    07/26/19 0643  NA 139  K 3.2*  CL 100  CO2 26  BUN 20  CREATININE 0.94  GLUCOSE 160*  CALCIUM 9.7   No results for input(s): LABPT, INR in the last 72 hours.  Assessment/Plan: Pt is POD1 s/p right RSA    -Plan to discharge to home tomorrow pending patient's pain  -No lifting with the operative arm     Gerrianne Scale Thaddeaus Monica 07/27/2019, 5:18 PM

## 2019-07-27 NOTE — Evaluation (Signed)
Physical Therapy Evaluation Patient Details Name: [name] MRN: [id number] DOB: [birth year] Today's Date: 07/27/2019   History of Present Illness  [name] is a 74 year old female with PMH of DM, HTN, and anxiety. Pt s/p right total shoulder arthroplasty.    Clinical Impression  Prior to admission, pt lives with her daughter, mother, and granddaughter. 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 Pt presents with R shoulder pain, weakness, and balance impairments. Requiring min guard assist for limited ambulation in room. Education provided regarding elevation, cryotherapy, initiation of elbow/wrist/hand exercises. Also recommended use of cane for further stability (pt has one at home). Will continue to follow acutely to progress mobility.     Follow Up Recommendations Outpatient PT;Supervision for mobility/OOB (when appropriate per surgeon)    Equipment Recommendations  None recommended by PT    Recommendations for Other Services       Precautions / Restrictions Precautions Precautions: Shoulder;Fall Type of Shoulder Precautions: NWB;AROM through elbow, wrist, hand;pendulums okay; Shoulder Interventions: Shoulder sling/immobilizer;At all times;Off for dressing/bathing/exercises Precaution Booklet Issued: Yes (comment) Precaution Comments: reviewed provided handout Required Braces or Orthoses: Sling Restrictions Weight Bearing Restrictions: Yes RUE Weight Bearing: Non weight bearing      Mobility  Bed Mobility Overal bed mobility: Modified Independent             General bed mobility comments: HOB elevated (has adjustable bed at home)  Transfers Overall transfer level: Needs assistance Equipment used: None Transfers: Sit to/from Stand Sit to Stand: Min guard         General transfer comment: minA to stabilize  Ambulation/Gait Ambulation/Gait assistance: Min guard Gait Distance (Feet): 15 Feet Assistive device: None Gait  Pattern/deviations: Step-through pattern;Decreased stride length Gait velocity: decreased   General Gait Details: Mildly unsteady but no overt LOB, min guard to stabilize  Stairs            Wheelchair Mobility    Modified Rankin (Stroke Patients Only)       Balance Overall balance assessment: Needs assistance Sitting-balance support: No upper extremity supported;Feet supported Sitting balance-Leahy Scale: Good     Standing balance support: During functional activity;No upper extremity supported Standing balance-Leahy Scale: Fair Standing balance comment: reliant on at least single UE support in standing                             Pertinent Vitals/Pain Pain Assessment: 0-10 Pain Score: 2  Pain Location: R shoulder Pain Descriptors / Indicators: Pressure;Sore Pain Intervention(s): Limited activity within patient's tolerance;Monitored during session;Repositioned    Home Living Family/patient expects to be discharged to:: Private residence Living Arrangements: Children;Parent(daughter, mother (who has dementia)) Available Help at Discharge: Family Type of Home: House Home Access: Level entry     Home Layout: Able to live on main level with bedroom/bathroom Home Equipment: Kasandra Knudsen - single point Additional Comments: Also has assist from grandchildren    Prior Function Level of Independence: Independent         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; retired bus Tree surgeon   Dominant Hand: Right    Extremity/Trunk Assessment   Upper Extremity Assessment Upper Extremity Assessment: RUE deficits/detail RUE Deficits / Details: NWB;no AROM of shoulder;AROM of elbow, wrist, and hand WFL RUE Sensation: WNL RUE Coordination: decreased gross motor    Lower Extremity Assessment Lower Extremity Assessment: Overall WFL for tasks assessed    Cervical / Trunk  Assessment Cervical / Trunk Assessment: Normal  Communication    Communication: No difficulties  Cognition Arousal/Alertness: Awake/alert Behavior During Therapy: WFL for tasks assessed/performed Overall Cognitive Status: Within Functional Limits for tasks assessed                                        General Comments     Exercises    Assessment/Plan    PT Assessment Patient needs continued PT services  PT Problem List Decreased strength;Decreased range of motion;Decreased balance;Decreased mobility;Pain       PT Treatment Interventions DME instruction;Gait training;Functional mobility training;Therapeutic activities;Therapeutic exercise;Balance training;Patient/family education    PT Goals (Current goals can be found in the Care Plan section)  Acute Rehab PT Goals Patient Stated Goal: to go home PT Goal Formulation: With patient Time For Goal Achievement: 08/10/19 Potential to Achieve Goals: Good    Frequency Min 5X/week   Barriers to discharge        Co-evaluation               AM-PAC PT "6 Clicks" Mobility  Outcome Measure Help needed turning from your back to your side while in a flat bed without using bedrails?: None Help needed moving from lying on your back to sitting on the side of a flat bed without using bedrails?: None Help needed moving to and from a bed to a chair (including a wheelchair)?: A Little Help needed standing up from a chair using your arms (e.g., wheelchair or bedside chair)?: A Little Help needed to walk in hospital room?: A Little Help needed climbing 3-5 steps with a railing? : A Little 6 Click Score: 20    End of Session Equipment Utilized During Treatment: Other (comment)(sling) Activity Tolerance: Patient tolerated treatment well Patient left: in chair;with call bell/phone within reach Nurse Communication: Mobility status PT Visit Diagnosis: Unsteadiness on feet (R26.81);Pain Pain - Right/Left: Right Pain - part of body: Shoulder    Time: [id number] PT Time Calculation  (min) (ACUTE ONLY): 25 min   Charges:   PT Evaluation $PT Eval Moderate Complexity: 1 Mod PT Treatments $Therapeutic Activity: 8-22 mins          Wyona Almas, PT, DPT Acute Rehabilitation Services Pager [phone] Office [phone]   Deno Etienne 07/27/2019, 12:59 PM

## 2019-07-28 ENCOUNTER — Other Ambulatory Visit: Payer: Self-pay | Admitting: Surgical

## 2019-07-28 DIAGNOSIS — M19011 Primary osteoarthritis, right shoulder: Secondary | ICD-10-CM | POA: Diagnosis not present

## 2019-07-28 LAB — URINE CULTURE: Culture: <10000 — AB

## 2019-07-28 LAB — GLUCOSE, CAPILLARY
Glucose-Capillary: 185 mg/dL — ABNORMAL HIGH (ref 70–99)
Glucose-Capillary: 165 mg/dL — ABNORMAL HIGH (ref 70–99)

## 2019-07-28 MED ORDER — OXYCODONE HCL 5 MG PO TABS: 5.0000 mg | ORAL_TABLET | ORAL | 0 refills | Status: DC | PRN

## 2019-07-28 MED ORDER — METHOCARBAMOL 500 MG PO TABS: 500.0000 mg | ORAL_TABLET | Freq: Three times a day (TID) | ORAL | 0 refills | Status: DC | PRN

## 2019-07-28 MED ORDER — CELECOXIB 200 MG PO CAPS: 200.0000 mg | ORAL_CAPSULE | Freq: Two times a day (BID) | ORAL | 0 refills | Status: DC

## 2019-07-28 NOTE — Progress Notes (Signed)
Discharge instruction packet given and reviewed instructions.  Patient will be discharged to home with daughter.

## 2019-07-28 NOTE — Discharge Summary (Signed)
Physician Discharge Summary      Patient ID: [name] MRN: [id number] DOB/AGE: April 07, 1946 74 y.o.  Admit date: 07/26/2019 Discharge date: 07/28/2019  Admission Diagnoses:  Active Problems:   S/P shoulder replacement, right   Discharge Diagnoses:  Same  Surgeries: Procedure(s): RIGHT TOTAL SHOULDER ARTHROPLASTY on 07/26/2019   Consultants:   Discharged Condition: Stable  Hospital Course: [name] is an 74 y.o. female who was admitted 07/26/2019 with a chief complaint of right shoulder pain, and found to have a diagnosis of right shoulder osteoarthritis.  They were brought to the operating room on 07/26/2019 and underwent the above named procedures.  Pt awoke from anesthesia without complication and was transferred to the floor. On POD1, patient's pain was controlled and block was starting to wear off.  She did well in PT but felt anxious about discharge home.  She stayed until POD2 when she felt comfortable with discharge home. She was discharged home on POD2.  She will use a CPM machine at home.  Pt will f/u with Dr. Marlou Sa in clinic in ~2 weeks.   Antibiotics given:  Anti-infectives (From admission, onward)   Start     Dose/Rate Route Frequency Ordered Stop   07/26/19 1600  ceFAZolin (ANCEF) IVPB 2g/100 mL premix     2 g 200 mL/hr over 30 Minutes Intravenous Every 8 hours 07/26/19 1538 07/27/19 0039   07/26/19 1056  vancomycin (VANCOCIN) powder  Status:  Discontinued       As needed 07/26/19 1057 07/26/19 1207   07/26/19 0600  ceFAZolin (ANCEF) 3 g in dextrose 5 % 50 mL IVPB     3 g 100 mL/hr over 30 Minutes Intravenous On call to O.R. 07/25/19 1115 07/26/19 1136    .  Recent vital signs:  Vitals:   07/28/19 0345 07/28/19 0902  BP: 125/77 125/77  Pulse: 75   Resp: 15   Temp: 98.2 F (36.8 C)   SpO2: 95%     Recent laboratory studies:  Results for orders placed or performed during the hospital encounter of 07/26/19  Urine culture   Specimen: Urine, Clean  Catch  Result Value Ref Range   Specimen Description URINE, CLEAN CATCH    Special Requests NONE    Culture (A)     <10,000 COLONIES/mL INSIGNIFICANT GROWTH Performed at [facility], [place] [address] [place], [place] [address]    Report Status 07/28/2019 FINAL   CBC  Result Value Ref Range   WBC 8.5 4.0 - 10.5 K/uL   RBC 4.87 3.87 - 5.11 MIL/uL   Hemoglobin 13.6 12.0 - 15.0 g/dL   HCT 41.8 36.0 - 46.0 %   MCV 85.8 80.0 - 100.0 fL   MCH 27.9 26.0 - 34.0 pg   MCHC 32.5 30.0 - 36.0 g/dL   RDW 14.2 11.5 - 15.5 %   Platelets 304 150 - 400 K/uL   nRBC 0.0 0.0 - 0.2 %  Basic metabolic panel  Result Value Ref Range   Sodium 139 135 - 145 mmol/L   Potassium 3.2 (L) 3.5 - 5.1 mmol/L   Chloride 100 98 - 111 mmol/L   CO2 26 22 - 32 mmol/L   Glucose, Bld 160 (H) 70 - 99 mg/dL   BUN 20 8 - 23 mg/dL   Creatinine, Ser 0.94 0.44 - 1.00 mg/dL   Calcium 9.7 8.9 - 10.3 mg/dL   GFR calc non Af Amer >60 >60 mL/min   GFR calc Af Amer >60 >60  mL/min   Anion gap 13 5 - 15  Urinalysis, Routine w reflex microscopic  Result Value Ref Range   Color, Urine YELLOW YELLOW   APPearance CLEAR CLEAR   Specific Gravity, Urine 1.021 1.005 - 1.030   pH 5.0 5.0 - 8.0   Glucose, UA NEGATIVE NEGATIVE mg/dL   Hgb urine dipstick NEGATIVE NEGATIVE   Bilirubin Urine NEGATIVE NEGATIVE   Ketones, ur NEGATIVE NEGATIVE mg/dL   Protein, ur NEGATIVE NEGATIVE mg/dL   Nitrite NEGATIVE NEGATIVE   Leukocytes,Ua LARGE (A) NEGATIVE   RBC / HPF 0-5 0 - 5 RBC/hpf   WBC, UA 21-50 0 - 5 WBC/hpf   Bacteria, UA RARE (A) NONE SEEN   Squamous Epithelial / LPF 0-5 0 - 5   Mucus PRESENT    Hyaline Casts, UA PRESENT   Glucose, capillary  Result Value Ref Range   Glucose-Capillary 170 (H) 70 - 99 mg/dL  Hemoglobin A1c  Result Value Ref Range   Hgb A1c MFr Bld 7.5 (H) 4.8 - 5.6 %   Mean Plasma Glucose 169 mg/dL  Glucose, capillary  Result Value Ref Range   Glucose-Capillary 217 (H) 70 - 99 mg/dL   Comment 1 Notify RN     Comment 2 Document in Chart   Glucose, capillary  Result Value Ref Range   Glucose-Capillary 211 (H) 70 - 99 mg/dL  Glucose, capillary  Result Value Ref Range   Glucose-Capillary 163 (H) 70 - 99 mg/dL  Glucose, capillary  Result Value Ref Range   Glucose-Capillary 126 (H) 70 - 99 mg/dL  Glucose, capillary  Result Value Ref Range   Glucose-Capillary 182 (H) 70 - 99 mg/dL  Glucose, capillary  Result Value Ref Range   Glucose-Capillary 223 (H) 70 - 99 mg/dL  Glucose, capillary  Result Value Ref Range   Glucose-Capillary 206 (H) 70 - 99 mg/dL  Glucose, capillary  Result Value Ref Range   Glucose-Capillary 185 (H) 70 - 99 mg/dL  Glucose, capillary  Result Value Ref Range   Glucose-Capillary 165 (H) 70 - 99 mg/dL    Discharge Medications:   Allergies as of 07/28/2019      Reactions   Prednisone Shortness Of Breath   Metformin Nausea And Vomiting   Too much diarrhea       Medication List    TAKE these medications   amLODipine 5 MG tablet Commonly known as: NORVASC Take 5 mg by mouth daily.   chlorthalidone 25 MG tablet Commonly known as: HYGROTON Take 25 mg by mouth daily.   fluticasone 50 MCG/ACT nasal spray Commonly known as: FLONASE Place 1-2 sprays into both nostrils daily as needed for allergies or rhinitis.   Lantus SoloStar 100 UNIT/ML Solostar Pen Generic drug: insulin glargine Inject 31 Units into the skin daily at 10 pm.   methocarbamol 500 MG tablet Commonly known as: ROBAXIN Take 1 tablet (500 mg total) by mouth every 8 (eight) hours as needed for muscle spasms. What changed:   when to take this  reasons to take this   oxyCODONE 5 MG immediate release tablet Commonly known as: Oxy IR/ROXICODONE Take 1 tablet (5 mg total) by mouth every 4 (four) hours as needed for moderate pain (pain score 4-6).   sertraline 100 MG tablet Commonly known as: ZOLOFT Take 100 mg by mouth daily.   topiramate 25 MG tablet Commonly known as: TOPAMAX Take 25  mg by mouth at bedtime.     ASK your doctor about these medications   celecoxib  200 MG capsule Commonly known as: CELEBREX TAKE 1 CAPSULE(200 MG) BY MOUTH TWICE DAILY       Diagnostic Studies: DG Shoulder Right Port  Result Date: 07/26/2019 CLINICAL DATA:  Status post right shoulder replacement. EXAM: PORTABLE RIGHT SHOULDER COMPARISON:  May 13, 2019. FINDINGS: The glenoid and humeral components appear to be well situated. No fracture or dislocation is noted. IMPRESSION: Status post right shoulder arthroplasty. Electronically Signed   By: Marijo Conception M.D.   On: 07/26/2019 14:47    Disposition: Discharge disposition: 01-Home or Self Care       Discharge Instructions    Call MD / Call 911   Complete by: As directed    If you experience chest pain or shortness of breath, CALL 911 and be transported to the hospital emergency room.  If you develope a fever above 101 F, pus (white drainage) or increased drainage or redness at the wound, or calf pain, call your surgeon's office.   Constipation Prevention   Complete by: As directed    Drink plenty of fluids.  Prune juice may be helpful.  You may use a stool softener, such as Colace (over the counter) 100 mg twice a day.  Use MiraLax (over the counter) for constipation as needed.   Diet - low sodium heart healthy   Complete by: As directed    Discharge instructions   Complete by: As directed    You may shower, dressing is waterproof.  Do not bathe or soak the operative shoulder in a tub, pool.  Use the CPM machine 3 times a day for one hour each time.  No lifting with the operative shoulder. Continue use of the sling.  Follow-up with Dr. Marlou Sa in ~2 weeks on your given appointment date.  We will remove your adhesive bandage at that time.   Increase activity slowly as tolerated   Complete by: As directed       Follow-up Information    [facility] Follow up.   Specialty: Home Health Services Why: home health services  arranged Contact information: [address] [place] [address] [phone]            Signed: Donella Stade 07/28/2019, 9:10 PM

## 2019-07-28 NOTE — Progress Notes (Signed)
Occupational Therapy Treatment Patient Details Name: [name] MRN: [id number] DOB: Nov 03, 1945 Today's Date: 07/28/2019    History of present illness 74 year old female with PMH of DM, HTN, and anxiety. Pt s/p right total shoulder arthroplasty.     OT comments  Pt progressing towards established OT goals and very motivated to participate in therapy. Upon arrival, pt in bathroom dressing at sink. Reviewing compensatory techniques for UB dressing and bathing; pt demonstrating understanding. Pt donning sling with Min A for strap placement and positioning. Pt performing RUE exercises including pendulums, elbow, wrist, forearm, and hand ROM. Providing education and answering questions in preparation for possible dc later today. Will continue to follow acutely as admitted.    Follow Up Recommendations  Follow surgeon's recommendation for DC plan and follow-up therapies    Equipment Recommendations  None recommended by OT    Recommendations for Other Services      Precautions / Restrictions Precautions Precautions: Shoulder;Fall Type of Shoulder Precautions: NWB;AROM through elbow, wrist, hand;pendulums okay; Shoulder Interventions: Shoulder sling/immobilizer;At all times;Off for dressing/bathing/exercises Precaution Booklet Issued: Yes (comment) Precaution Comments: reviewed provided handout Required Braces or Orthoses: Sling Restrictions Weight Bearing Restrictions: Yes RUE Weight Bearing: Non weight bearing       Mobility Bed Mobility               General bed mobility comments: In bathroom upon arrival. Discussed sleep positioning and bed mobiltiy to left  Transfers Overall transfer level: Needs assistance Equipment used: None Transfers: Sit to/from Stand Sit to Stand: Min guard         General transfer comment: Min Guard A for safety    Balance Overall balance assessment: Needs assistance Sitting-balance support: No upper extremity supported;Feet  supported Sitting balance-Leahy Scale: Good     Standing balance support: During functional activity;No upper extremity supported Standing balance-Leahy Scale: Fair                             ADL either performed or assessed with clinical judgement   ADL Overall ADL's : Needs assistance/impaired           Upper Body Bathing Details (indicate cue type and reason): Reviewing UB bathing techniques to adhere to shoulder precautions     Upper Body Dressing : Minimal assistance;Sitting Upper Body Dressing Details (indicate cue type and reason): Pt donning shirt with Min guard A and cues. Min A for donning sling to reposition strap and cues for proper positioning     Toilet Transfer: Min guard;Ambulation(simulated) Toilet Transfer Details (indicate cue type and reason): Min Guard A for safety         Functional mobility during ADLs: Min guard General ADL Comments: Focused session on dressing, bathing, and fucntional mobility. Pt demosntrating understanding     Vision       Perception     Praxis      Cognition Arousal/Alertness: Awake/alert Behavior During Therapy: WFL for tasks assessed/performed Overall Cognitive Status: Within Functional Limits for tasks assessed                                 General Comments: Very motivated        Exercises Exercises: Shoulder Shoulder Exercises Pendulum Exercise: Right;10 reps;Standing(Pt reporting she did 10 reps in standing prior to OT arrival) Elbow Flexion: Right;AROM;10 reps;Seated Elbow Extension: Right;AROM;10 reps;Seated Wrist Flexion: Right;AROM;10 reps;Seated Wrist Extension: Right;AROM;10  reps;Seated Digit Composite Flexion: Right;AROM;10 reps;Seated Composite Extension: Right;AROM;10 reps;Seated Hand Exercises Forearm Supination: Right;AROM;10 reps;Seated Forearm Pronation: Right;AROM;10 reps;Seated   Shoulder Instructions Shoulder Instructions Donning/doffing shirt without moving  shoulder: Min-guard Method for sponge bathing under operated UE: Min-guard Donning/doffing sling/immobilizer: Minimal assistance Correct positioning of sling/immobilizer: Minimal assistance;Min-guard Pendulum exercises (written home exercise program): Min-guard ROM for elbow, wrist and digits of operated UE: Supervision/safety Sling wearing schedule (on at all times/off for ADL's): Supervision/safety Proper positioning of operated UE when showering: Min-guard Positioning of UE while sleeping: Minimal assistance     General Comments      Pertinent Vitals/ Pain       Pain Assessment: Faces Faces Pain Scale: Hurts a little bit Pain Location: R shoulder Pain Descriptors / Indicators: Pressure;Sore Pain Intervention(s): Monitored during session;Repositioned  Home Living                                          Prior Functioning/Environment              Frequency  Min 2X/week        Progress Toward Goals  OT Goals(current goals can now be found in the care plan section)  Progress towards OT goals: Progressing toward goals  Acute Rehab OT Goals Patient Stated Goal: to go home OT Goal Formulation: With patient Time For Goal Achievement: 08/10/19 Potential to Achieve Goals: Good ADL Goals Pt Will Perform Grooming: with modified independence;standing;sitting Pt Will Perform Upper Body Dressing: with modified independence;sitting Pt Will Perform Lower Body Dressing: with modified independence;sit to/from stand;with adaptive equipment Pt/caregiver will Perform Home Exercise Program: Right Upper extremity;Increased ROM;With written HEP provided  Plan Discharge plan remains appropriate    Co-evaluation                 AM-PAC OT "6 Clicks" Daily Activity     Outcome Measure   Help from another person eating meals?: A Little Help from another person taking care of personal grooming?: A Little Help from another person toileting, which includes  using toliet, bedpan, or urinal?: A Little Help from another person bathing (including washing, rinsing, drying)?: A Little Help from another person to put on and taking off regular upper body clothing?: A Little Help from another person to put on and taking off regular lower body clothing?: A Little 6 Click Score: 18    End of Session Equipment Utilized During Treatment: Other (comment)(sling)  OT Visit Diagnosis: Other abnormalities of gait and mobility (R26.89);Pain Pain - Right/Left: Right Pain - part of body: Shoulder   Activity Tolerance Patient tolerated treatment well   Patient Left in chair;with call bell/phone within reach   Nurse Communication Mobility status        Time: [id number] OT Time Calculation (min): 19 min  Charges: OT General Charges $OT Visit: 1 Visit OT Treatments $Self Care/Home Management : 8-22 mins  [place], OTR/L Acute Rehab Pager: [phone] Office: [place] 07/28/2019, 12:22 PM

## 2019-07-28 NOTE — Progress Notes (Signed)
  Subjective: [name] is a 74 y.o. female s/p right RSA.  They are POD2.  Pt's pain is controlled.  Block is almost completely worn off except for mild numbness of thumb and small finger.  No dizziness with ambulation.  States she is ready for discharge home.    Objective: Vital signs in last 24 hours: Temp:  [98.2 F (36.8 C)-98.9 F (37.2 C)] 98.2 F (36.8 C) (04/15 0345) Pulse Rate:  [68-83] 75 (04/15 0345) Resp:  [15-18] 15 (04/15 0345) BP: (108-125)/(61-77) 125/77 (04/15 0902) SpO2:  [95 %-100 %] 95 % (04/15 0345)  Intake/Output from previous day: 04/14 0701 - 04/15 0700 In: 2235 [P.O.:820; I.V.:1415] Out: -  Intake/Output this shift: No intake/output data recorded.  Exam:  No gross blood or drainage overlying the dressing 2+ radial pulse Sensation intact distally in the right hand with mild numbness lingering in right thumb and small finger Able to extend the right wrist and thumb.  IP flexion, finger abduction intact   Labs: Recent Labs    07/26/19 0643  HGB 13.6   Recent Labs    07/26/19 0643  WBC 8.5  RBC 4.87  HCT 41.8  PLT 304   Recent Labs    07/26/19 0643  NA 139  K 3.2*  CL 100  CO2 26  BUN 20  CREATININE 0.94  GLUCOSE 160*  CALCIUM 9.7   No results for input(s): LABPT, INR in the last 72 hours.  Assessment/Plan: Pt is POD2 s/p right RSA    -Plan to discharge to home today  -No lifting with the operative arm  -Okay to shower, dressing is waterproof.  Cautioned patient against soaking dressing in bath/pool/body of water  -Use the CPM machine at least 3 times per day for one hour each time, increasing the degrees daily.     Gerrianne Scale [name] 07/28/2019, 12:31 PM

## 2019-08-02 ENCOUNTER — Telehealth: Payer: Self-pay

## 2019-08-02 NOTE — Telephone Encounter (Signed)
Received call from [place] with [facility]. They have tried to reach patient for over a week about getting HHPT arranged for patient. She called therapist back at 330am this morning. She informed therapist that she is not longer in [facility] where she had originally told them she would be and is now in [place]. [place] wanted to let you know that unfortunately they do not have the staffing in [place] to see patient until this weekend. He would like to know if you still want them to see patient or just go ahead and refer her to out patient therapy? Please advise. Thanks.

## 2019-08-02 NOTE — Telephone Encounter (Signed)
Ok to rf to out pt pt thx

## 2019-08-03 ENCOUNTER — Other Ambulatory Visit: Payer: Self-pay

## 2019-08-03 DIAGNOSIS — Z96619 Presence of unspecified artificial shoulder joint: Secondary | ICD-10-CM

## 2019-08-03 NOTE — Telephone Encounter (Signed)
Referral made Will call and discuss with patient

## 2019-08-08 ENCOUNTER — Encounter: Payer: Self-pay | Admitting: Orthopedic Surgery

## 2019-08-08 ENCOUNTER — Encounter: Payer: Self-pay | Admitting: Physical Therapy

## 2019-08-08 ENCOUNTER — Ambulatory Visit (INDEPENDENT_AMBULATORY_CARE_PROVIDER_SITE_OTHER): Payer: Medicare PPO | Admitting: Orthopedic Surgery

## 2019-08-08 ENCOUNTER — Ambulatory Visit: Payer: Medicare PPO | Admitting: Physical Therapy

## 2019-08-08 ENCOUNTER — Ambulatory Visit: Payer: Self-pay

## 2019-08-08 ENCOUNTER — Other Ambulatory Visit: Payer: Self-pay

## 2019-08-08 VITALS — Ht 67.0 in | Wt 280.0 lb

## 2019-08-08 DIAGNOSIS — M25611 Stiffness of right shoulder, not elsewhere classified: Secondary | ICD-10-CM

## 2019-08-08 DIAGNOSIS — G8929 Other chronic pain: Secondary | ICD-10-CM | POA: Diagnosis not present

## 2019-08-08 DIAGNOSIS — M6281 Muscle weakness (generalized): Secondary | ICD-10-CM

## 2019-08-08 DIAGNOSIS — Z96611 Presence of right artificial shoulder joint: Secondary | ICD-10-CM | POA: Diagnosis not present

## 2019-08-08 DIAGNOSIS — M25511 Pain in right shoulder: Secondary | ICD-10-CM

## 2019-08-08 MED ORDER — OXYCODONE HCL 5 MG PO TABS: 5.0000 mg | ORAL_TABLET | Freq: Two times a day (BID) | ORAL | 0 refills | Status: DC | PRN

## 2019-08-08 NOTE — Progress Notes (Signed)
Post-Op Visit Note   Patient: [name]           Date of Birth: 06-Aug-1945           MRN: [id number] Visit Date: 08/08/2019 PCP: Rose Phi, FNP   Assessment & Plan:  Chief Complaint:  Chief Complaint  Patient presents with  . Right Shoulder - Routine Post Op    07/26/2019 Right TSA   Visit Diagnoses:  1. Status post reverse arthroplasty of shoulder, right     Plan: Patient is a 74 year old female who presents s/p right reverse shoulder arthroplasty on 07/26/2019.  She is doing well overall.  She is using her CPM machine and she is up to 90 degrees.  She is using it 3 times a day as instructed.  She has mild difficulty with sleeping but overall her pain is well controlled.  She is taking oxycodone with good relief, averaging about once per day.  She has been using her sling as instructed.  Plan to discontinue the sling today and hold off on lifting anything more than about 5 pounds.  Incision is healing well and Steri-Strips are intact.  Range of motion is good with 95 degrees of abduction and 100 degrees of forward flexion.  She has good strength with internal rotation of the shoulder.  We will refill her pain medication.  Radiographs show a well positioned prosthesis without any complicating features.  She is already scheduled for outpatient physical therapy upstairs in this facility.  Plan to follow-up in 4 weeks for clinical recheck.  Follow-Up Instructions: No follow-ups on file.   Orders:  Orders Placed This Encounter  Procedures  . XR Shoulder Right   Meds ordered this encounter  Medications  . oxyCODONE (OXY IR/ROXICODONE) 5 MG immediate release tablet    Sig: Take 1 tablet (5 mg total) by mouth every 12 (twelve) hours as needed for moderate pain (pain score 4-6).    Dispense:  30 tablet    Refill:  0    Imaging: No results found.  PMFS History: Patient Active Problem List   Diagnosis Date Noted  . 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 S/P  knee replacement 02/16/2015  . Osteoarthritis of left knee 02/16/2015  . OBESITY 10/25/2007  . HIATAL HERNIA 10/25/2007  . ABDOMINAL PAIN-EPIGASTRIC 10/25/2007   Past Medical History:  Diagnosis Date  . Anxiety   . Arthritis    osteoarthritis left knee, right shoulder "locks" occ.  . Diabetes mellitus without complication ([place])   . H/O seasonal allergies   . History of hiatal hernia   . Hypertension   . Pneumonia    as a child  . Sleep apnea    no cpap-continues to be evaluated    No family history on file.  Past Surgical History:  Procedure Laterality Date  . ABDOMINAL HYSTERECTOMY    . BACK SURGERY     lumbar fusion  . CATARACT EXTRACTION, BILATERAL    . CHOLECYSTECTOMY    . COLONOSCOPY WITH PROPOFOL N/A 07/30/2017   Procedure: COLONOSCOPY WITH PROPOFOL;  Surgeon: Juanita Craver, MD;  Location: [facility];  Service: Endoscopy;  Laterality: N/A;  . EXPLORATORY LAPAROTOMY     ovaries freed from intestines.  Marland Kitchen FOOT SURGERY     retained hardware.  Marland Kitchen KNEE ARTHROSCOPY Right    scope surgery  . TONSILLECTOMY    . TOTAL KNEE ARTHROPLASTY Left 02/16/2015   Procedure: LEFT TOTAL KNEE ARTHROPLASTY;  Surgeon: Sydnee Cabal, MD;  Location: [facility];  Service: Orthopedics;  Laterality: Left;  . TOTAL SHOULDER ARTHROPLASTY Right 07/26/2019   Procedure: RIGHT TOTAL SHOULDER ARTHROPLASTY;  Surgeon: Meredith Pel, MD;  Location: [facility];  Service: Orthopedics;  Laterality: Right;  . WRIST SURGERY Left    x2 "cysts"   Social History   Occupational History  . Not on file  Tobacco Use  . Smoking status: Former Smoker    Packs/day: 0.50    Years: 10.00    Pack years: 5.00    Types: Cigarettes    Quit date: 02/07/1973    Years since quitting: 46.5  . Smokeless tobacco: Never Used  Substance and Sexual Activity  . Alcohol use: Yes    Comment: rare occ.  . Drug use: No  . Sexual activity: Not on file

## 2019-08-08 NOTE — Therapy (Addendum)
[facility] Physical Therapy [address] [place], [place], [address] Phone: [phone]   Fax:  [phone]  Physical Therapy Evaluation  Patient Details  Name: [name] MRN: [id number] Date of Birth: 09/30/1945 Referring Provider (PT): Marlou Sa Tonna Corner, MD   Encounter Date: 08/08/2019  PT End of Session - 08/08/19 1543    Visit Number  1    Number of Visits  12    Date for PT Re-Evaluation  10/31/19    Authorization Type  [employer] MCR    PT Start Time  [id number]    PT Stop Time  1530    PT Time Calculation (min)  45 min    Activity Tolerance  Patient tolerated treatment well    Behavior During Therapy  [facility] for tasks assessed/performed       Past Medical History:  Diagnosis Date  . Anxiety   . Arthritis    osteoarthritis left knee, right shoulder "locks" occ.  . Diabetes mellitus without complication ([place])   . H/O seasonal allergies   . History of hiatal hernia   . Hypertension   . Pneumonia    as a child  . Sleep apnea    no cpap-continues to be evaluated    Past Surgical History:  Procedure Laterality Date  . ABDOMINAL HYSTERECTOMY    . BACK SURGERY     lumbar fusion  . CATARACT EXTRACTION, BILATERAL    . CHOLECYSTECTOMY    . COLONOSCOPY WITH PROPOFOL N/A 07/30/2017   Procedure: COLONOSCOPY WITH PROPOFOL;  Surgeon: Juanita Craver, MD;  Location: [facility];  Service: Endoscopy;  Laterality: N/A;  . EXPLORATORY LAPAROTOMY     ovaries freed from intestines.  Marland Kitchen FOOT SURGERY     retained hardware.  Marland Kitchen KNEE ARTHROSCOPY Right    scope surgery  . TONSILLECTOMY    . TOTAL KNEE ARTHROPLASTY Left 02/16/2015   Procedure: LEFT TOTAL KNEE ARTHROPLASTY;  Surgeon: Sydnee Cabal, MD;  Location: [facility];  Service: Orthopedics;  Laterality: Left;  . TOTAL SHOULDER ARTHROPLASTY Right 07/26/2019   Procedure: RIGHT TOTAL SHOULDER ARTHROPLASTY;  Surgeon: Meredith Pel, MD;  Location: [place];  Service: Orthopedics;  Laterality: Right;  . WRIST SURGERY  Left    x2 "cysts"    There were no vitals filed for this visit.   Subjective Assessment - 08/08/19 1448    Subjective  She had Rt TSA on 07/26/19. She says pain is doing okay today, bothers her if she rolls over onto it.    Pertinent History  PMH: Rt TSA 07/26/19, DM, sleep apnea, lumbar fusion 13 years ago, Lt TKA 4 years ago    Limitations  Lifting;Standing;Walking;House hold activities    Patient Stated Goals  reduce pain, regain function in Rt shoulder    Currently in Pain?  Yes    Pain Score  5     Pain Location  Shoulder    Pain Orientation  Right    Pain Descriptors / Indicators  Aching;Sore    Pain Type  Surgical pain    Pain Radiating Towards  some N/T in Rt last digit    Pain Onset  1 to 4 weeks ago    Pain Frequency  Intermittent    Aggravating Factors   sleeping    Pain Relieving Factors  rest, meds         [facility] PT Assessment - 08/08/19 0001      Assessment   Medical Diagnosis  S/P Rt TSA 07/26/19    Referring  Provider (PT)  Marlou Sa Tonna Corner, MD    Onset Date/Surgical Date  07/26/19    Hand Dominance  Right    Next MD Visit  09/07/19    Prior Therapy  PT for one visit for her back      Restrictions   Other Position/Activity Restrictions  Restrictions: PROM as tolerated, no ER past 30 deg, no subscap strengthening, Deltoid isometrics ok. Last MD progress notes states "Plan to discontinue the sling today and hold off on lifting anything more than about 5 pounds"      Balance Screen   Has the patient fallen in the past 6 months  No      [place] residence      Prior Function   Level of Independence  Independent    Vocation  Retired      Associate Professor   Overall Cognitive Status  Within Functional Limits for tasks assessed      Observation/Other Assessments   Observations  anterior incision looks healing without signs of infection of drainage. incision still dressed with steristrips      Sensation   Light Touch   Appears Intact      ROM / Strength   AROM / PROM / Strength  PROM;Strength      PROM   Overall PROM Comments  cues to limit muscle guarding     PROM Assessment Site  Shoulder    Right/Left Shoulder  Right    Right Shoulder Flexion  90 Degrees    Right Shoulder ABduction  80 Degrees    Right Shoulder Internal Rotation  30 Degrees    Right Shoulder External Rotation  20 Degrees      Strength   Overall Strength Comments  not tested due to post op status                Objective measurements completed on examination: See above findings.      [place] Adult PT Treatment/Exercise - 08/08/19 0001      Exercises   Exercises  Shoulder      Shoulder Exercises: Seated   Other Seated Exercises  seated tableslide PROM flexion, abd, ER 5 sec X 5 ea    Other Seated Exercises  elbow flexion AROM X 10, scapular retraction X 10      Shoulder Exercises: Standing   Other Standing Exercises  pendulum circles  X10 each way    Other Standing Exercises  Rt shoulder isometrics 5 sec X 10 for flexion and then ER (no IR per MD)             PT Education - 08/08/19 1543    Education Details  HEP, POC    Person(s) Educated  Patient    Methods  Explanation;Demonstration;Verbal cues;Handout    Comprehension  Verbalized understanding;Returned demonstration       PT Short Term Goals - 08/08/19 1552      PT SHORT TERM GOAL #1   Title  Pt will be I and compliant with initial HEP.    Time  4    Period  Weeks    Status  New    Target Date  09/05/19      PT SHORT TERM GOAL #2   Title  Pt will improve Rt shoulder PROM to [facility] as protocol allows.    Time  4    Period  Weeks    Status  New        PT  Long Term Goals - 08/08/19 1553      PT LONG TERM GOAL #1   Title  Pt will be I and compliant with HEP progresion. (Target for all goals 12 weeks 10/31/19))    Time  12    Period  Weeks    Status  New      PT LONG TERM GOAL #2   Title  Pt will improve Rt shoulder AROM to [facility] (at  least 140 deg flexion and abduction)    Status  New      PT LONG TERM GOAL #3   Title  Pt will improve Rt shoulder strength to at least 4+/5 to improve function.    Status  New      PT LONG TERM GOAL #4   Title  Pt will reduce overall pain less than 3/10 with ususal activity.    Status  New             Plan - 08/08/19 1545    Clinical Impression Statement  Pt presents for new PT evaluation S/P Rt TKA on 07/26/19.  MD restrictions are as follows "PROM as tolerated, no ER past 30 deg, no subscap strengthening, Deltoid isometrics ok, last MD progress note statesPlan to discontinue the sling today and hold off on lifting anything more than about 5 pounds". She will benefit from skilled PT to regain functional use of her Rt shoulder and decrease overall pain.    Personal Factors and Comorbidities  Comorbidity 2;Time since onset of injury/illness/exacerbation;Fitness    Comorbidities  PMH: Rt TSA 07/26/19, DM, sleep apnea, lumbar fusion 13 years ago, Lt TKA 4 years ago    Examination-Activity Limitations  Bend;Carry;Squat;Stairs;Sleep;Locomotion Level;Lift;Stand;Transfers    Examination-Participation Restrictions  Cleaning;Community Activity;Shop;Laundry    Stability/Clinical Decision Making  Evolving/Moderate complexity    Clinical Decision Making  Moderate    Rehab Potential  Good    PT Frequency  2x / week   2-3   PT Duration  12 weeks    PT Treatment/Interventions  ADLs/Self Care Home Management;Aquatic Therapy;Cryotherapy;Electrical Stimulation;Iontophoresis 4mg /ml Dexamethasone;Moist Heat;Ultrasound;Gait training;Therapeutic activities;Therapeutic exercise;Neuromuscular re-education;Patient/family education;Manual techniques;Passive range of motion;Dry needling;Energy conservation;Joint Manipulations;Taping    PT Next Visit Plan  review and update HEP PRN, progress as able per protocol and restrictions    PT Home Exercise Plan  access code: [email or web address]    Consulted and Agree with Plan  of Care  Patient       Patient will benefit from skilled therapeutic intervention in order to improve the following deficits and impairments:  Decreased activity tolerance, Decreased endurance, Decreased mobility, Decreased range of motion, Decreased strength, Impaired flexibility, Increased muscle spasms, Obesity, Postural dysfunction, Pain, Increased fascial restricitons  Visit Diagnosis: Chronic right shoulder pain - Plan: PT plan of care cert/re-cert  Stiffness of right shoulder, not elsewhere classified - Plan: PT plan of care cert/re-cert  Muscle weakness (generalized) - Plan: PT plan of care cert/re-cert     Problem List Patient Active Problem List   Diagnosis Date Noted  . 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 Osteoarthritis of left knee 02/16/2015  . OBESITY 10/25/2007  . HIATAL HERNIA 10/25/2007  . ABDOMINAL PAIN-EPIGASTRIC 10/25/2007    Silvestre Mesi 08/08/2019, 4:24 PM  [facility] Physical Therapy [address] [place], [place], [address] Phone: [phone]   Fax:  [phone]  Name: [name] MRN: [id number] Date of Birth: June 02, 1945

## 2019-08-08 NOTE — Patient Instructions (Signed)
Access Code: [email or web address]: [email or web address]: 04/26/2021Prepared by: [name] NelsonExercises  Circular Shoulder Pendulum with Table Support - 2 x daily - 6 x weekly - 10 reps - 3 sets  Seated Elbow Flexion Extension AROM - 2 x daily - 6 x weekly - 10 reps - 3 sets  Seated Scapular Retraction - 2 x daily - 6 x weekly - 10 reps - 2-3 sets - 5 sec hold  Seated Shoulder Flexion Slide at Table Top with Forearm in Neutral - 2 x daily - 6 x weekly - 10 reps - 1-2 sets - 5 sec hold  Seated Shoulder Scaption Slide at Table Top with Forearm in Neutral - 2 x daily - 6 x weekly - 1-2 sets - 10 reps - 5 hold  Seated Shoulder External Rotation PROM on Table - 2-3 x daily - 6 x weekly - 10 reps - 1-2 sets - 5 hold  Standing Isometric Shoulder External Rotation with Doorway - 2 x daily - 6 x weekly - 1 sets - 10 reps - 5 hold  Standing Isometric Shoulder Flexion with Doorway - Arm Bent - 2 x daily - 6 x weekly - 1 sets - 10 reps - 5 hold

## 2019-08-11 ENCOUNTER — Ambulatory Visit (INDEPENDENT_AMBULATORY_CARE_PROVIDER_SITE_OTHER): Payer: Medicare PPO | Admitting: Rehabilitative and Restorative Service Providers"

## 2019-08-11 ENCOUNTER — Encounter: Payer: Self-pay | Admitting: Rehabilitative and Restorative Service Providers"

## 2019-08-11 ENCOUNTER — Other Ambulatory Visit: Payer: Self-pay

## 2019-08-11 DIAGNOSIS — M25511 Pain in right shoulder: Secondary | ICD-10-CM

## 2019-08-11 DIAGNOSIS — M545 Low back pain, unspecified: Secondary | ICD-10-CM

## 2019-08-11 DIAGNOSIS — M25611 Stiffness of right shoulder, not elsewhere classified: Secondary | ICD-10-CM | POA: Diagnosis not present

## 2019-08-11 DIAGNOSIS — M25562 Pain in left knee: Secondary | ICD-10-CM

## 2019-08-11 DIAGNOSIS — G8929 Other chronic pain: Secondary | ICD-10-CM

## 2019-08-11 DIAGNOSIS — M6281 Muscle weakness (generalized): Secondary | ICD-10-CM

## 2019-08-11 DIAGNOSIS — M25561 Pain in right knee: Secondary | ICD-10-CM

## 2019-08-11 NOTE — Therapy (Signed)
[facility] Physical Therapy [address] [place], [place], [address] Phone: [phone]   Fax:  [phone]  Physical Therapy Treatment  Patient Details  Name: [name] MRN: [id number] Date of Birth: April 08, 1946 Referring Provider (PT): Marlou Sa Tonna Corner, MD   Encounter Date: 08/11/2019  PT End of Session - 08/11/19 1006    Visit Number  2    Number of Visits  12    Date for PT Re-Evaluation  10/31/19    Authorization Type  [employer] MCR    PT Start Time  0920    PT Stop Time  1005    PT Time Calculation (min)  45 min    Activity Tolerance  Patient tolerated treatment well    Behavior During Therapy  [facility] for tasks assessed/performed       Past Medical History:  Diagnosis Date  . Anxiety   . Arthritis    osteoarthritis left knee, right shoulder "locks" occ.  . Diabetes mellitus without complication ([place])   . H/O seasonal allergies   . History of hiatal hernia   . Hypertension   . Pneumonia    as a child  . Sleep apnea    no cpap-continues to be evaluated    Past Surgical History:  Procedure Laterality Date  . ABDOMINAL HYSTERECTOMY    . BACK SURGERY     lumbar fusion  . CATARACT EXTRACTION, BILATERAL    . CHOLECYSTECTOMY    . COLONOSCOPY WITH PROPOFOL N/A 07/30/2017   Procedure: COLONOSCOPY WITH PROPOFOL;  Surgeon: Juanita Craver, MD;  [facility];  Service: Endoscopy;  Laterality: N/A;  . EXPLORATORY LAPAROTOMY     ovaries freed from intestines.  Marland Kitchen FOOT SURGERY     retained hardware.  Marland Kitchen KNEE ARTHROSCOPY Right    scope surgery  . TONSILLECTOMY    . TOTAL KNEE ARTHROPLASTY Left 02/16/2015   Procedure: LEFT TOTAL KNEE ARTHROPLASTY;  Surgeon: Sydnee Cabal, MD;  Location: [facility];  Service: Orthopedics;  Laterality: Left;  . TOTAL SHOULDER ARTHROPLASTY Right 07/26/2019   Procedure: RIGHT TOTAL SHOULDER ARTHROPLASTY;  Surgeon: Meredith Pel, MD;  Location: [place];  Service: Orthopedics;  Laterality: Right;  . WRIST SURGERY  Left    x2 "cysts"    There were no vitals filed for this visit.  Subjective Assessment - 08/11/19 0920    Subjective  Sleeping is interrupted.  Otherwise, [name] is happy with her progress.    Pertinent History  PMH: Rt TSA 07/26/19, DM, sleep apnea, lumbar fusion 13 years ago, Lt TKA 4 years ago    Limitations  Lifting;Standing;Walking;House hold activities    Patient Stated Goals  reduce pain, regain function in Rt shoulder    Currently in Pain?  Yes    Pain Score  5     Pain Location  Shoulder    Pain Orientation  Right    Pain Descriptors / Indicators  Aching;Sore    Pain Type  Surgical pain;Chronic pain    Pain Onset  1 to 4 weeks ago    Effect of Pain on Daily Activities  Sleep, reaching and overhead.                       [place] Adult PT Treatment/Exercise - 08/11/19 0001      Shoulder Exercises: Supine   Protraction  10 reps   Supine arm raise (reach for ceiling palm in)     Shoulder Exercises: Seated   Other Seated Exercises  seated tableslide PROM flexion and scaption 2 sets of 5 sec X 5 ea    Other Seated Exercises  elbow flexion AROM X 10, scapular retraction X 10      Shoulder Exercises: Standing   Retraction  10 reps   5 seconds shoulder blade pinches   Other Standing Exercises  pendulum circles  X10 each way    Other Standing Exercises  Rt shoulder isometrics 5 sec X 10 for flexion and then ER (no IR per MD)             PT Education - 08/11/19 1004    Education Details  Reviewed the importance of avoiding ER and behind the body activities.  Particularly, avoid pushing up with the post-surgical R shoulder.    Person(s) Educated  Patient    Methods  Explanation;Demonstration;Handout    Comprehension  Verbalized understanding;Returned demonstration       PT Short Term Goals - 08/08/19 1552      PT SHORT TERM GOAL #1   Title  Pt will be I and compliant with initial HEP.    Time  4    Period  Weeks    Status  New    Target Date   09/05/19      PT SHORT TERM GOAL #2   Title  Pt will improve Rt shoulder PROM to [facility] as protocol allows.    Time  4    Period  Weeks    Status  New        PT Long Term Goals - 08/08/19 1553      PT LONG TERM GOAL #1   Title  Pt will be I and compliant with HEP progresion. (Target for all goals 12 weeks 10/31/19))    Time  12    Period  Weeks    Status  New      PT LONG TERM GOAL #2   Title  Pt will improve Rt shoulder AROM to [facility] (at least 140 deg flexion and abduction)    Status  New      PT LONG TERM GOAL #3   Title  Pt will improve Rt shoulder strength to at least 4+/5 to improve function.    Status  New      PT LONG TERM GOAL #4   Title  Pt will reduce overall pain less than 3/10 with ususal activity.    Status  New            Plan - 08/11/19 1006    Clinical Impression Statement  [name] is making progress with her PROM.  She reports good HEP compliance.  AROM will be progressed as appropriate while still limiting ER to 30 degrees.    Personal Factors and Comorbidities  Comorbidity 2;Time since onset of injury/illness/exacerbation;Fitness    Comorbidities  PMH: Rt TSA 07/26/19, DM, sleep apnea, lumbar fusion 13 years ago, Lt TKA 4 years ago    Examination-Activity Limitations  Bend;Carry;Squat;Stairs;Sleep;Locomotion Level;Lift;Stand;Transfers    Examination-Participation Restrictions  Cleaning;Community Activity;Shop;Laundry    Stability/Clinical Decision Making  Evolving/Moderate complexity    Rehab Potential  Good    PT Frequency  2x / week   2-3   PT Duration  12 weeks    PT Treatment/Interventions  ADLs/Self Care Home Management;Aquatic Therapy;Cryotherapy;Electrical Stimulation;Iontophoresis 4mg /ml Dexamethasone;Moist Heat;Ultrasound;Gait training;Therapeutic activities;Therapeutic exercise;Neuromuscular re-education;Patient/family education;Manual techniques;Passive range of motion;Dry needling;Energy conservation;Joint Manipulations;Taping    PT Next Visit  Plan  review and update HEP PRN, progress as able per protocol and  restrictions    PT Home Exercise Plan  access code: [email or web address] Code: [email or web address]: [email or web address]: 04/29/2021Prepared by: Herbie Baltimore LovellExercisesStanding Scapular Retraction - 5 x daily - 7 x weekly - 1 sets - 5 reps - 5 holdSupine Bilateral Shoulder Protraction - 2 x daily - 7 x weekly - 2 sets - 10 reps - 3 second hold    Consulted and Agree with Plan of Care  Patient       Patient will benefit from skilled therapeutic intervention in order to improve the following deficits and impairments:  Decreased activity tolerance, Decreased endurance, Decreased mobility, Decreased range of motion, Decreased strength, Impaired flexibility, Increased muscle spasms, Obesity, Postural dysfunction, Pain, Increased fascial restricitons  Visit Diagnosis: Chronic right shoulder pain  Stiffness of right shoulder, not elsewhere classified  Muscle weakness (generalized)  Chronic bilateral low back pain, unspecified whether sciatica present  Chronic pain of right knee  Chronic pain of left knee     Problem List Patient Active Problem List   Diagnosis Date Noted  . 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 Osteoarthritis of left knee 02/16/2015  . OBESITY 10/25/2007  . HIATAL HERNIA 10/25/2007  . ABDOMINAL PAIN-EPIGASTRIC 10/25/2007    Farley Ly PT, MPT 08/11/2019, 10:09 AM  [facility] Physical Therapy [address] [place], [place], [address] Phone: [phone]   Fax:  [phone]  Name: [name] MRN: [id number] Date of Birth: 05/03/1945

## 2019-08-11 NOTE — Patient Instructions (Signed)
Access Code: [email or web address] URL: [email or web address] Date: 08/11/2019 Prepared by: Vista Mink  Exercises Standing Scapular Retraction - 5 x daily - 7 x weekly - 1 sets - 5 reps - 5 hold Supine Bilateral Shoulder Protraction - 2 x daily - 7 x weekly - 2 sets - 10 reps - 3 second hold

## 2019-08-13 ENCOUNTER — Encounter: Payer: Self-pay | Admitting: Orthopedic Surgery

## 2019-08-15 ENCOUNTER — Other Ambulatory Visit: Payer: Self-pay

## 2019-08-15 ENCOUNTER — Ambulatory Visit (INDEPENDENT_AMBULATORY_CARE_PROVIDER_SITE_OTHER): Payer: Medicare PPO | Admitting: Physical Therapy

## 2019-08-15 ENCOUNTER — Encounter: Payer: Self-pay | Admitting: Physical Therapy

## 2019-08-15 DIAGNOSIS — R600 Localized edema: Secondary | ICD-10-CM

## 2019-08-15 DIAGNOSIS — G8929 Other chronic pain: Secondary | ICD-10-CM

## 2019-08-15 DIAGNOSIS — M25611 Stiffness of right shoulder, not elsewhere classified: Secondary | ICD-10-CM | POA: Diagnosis not present

## 2019-08-15 DIAGNOSIS — M25511 Pain in right shoulder: Secondary | ICD-10-CM

## 2019-08-15 DIAGNOSIS — M6281 Muscle weakness (generalized): Secondary | ICD-10-CM

## 2019-08-15 NOTE — Therapy (Signed)
[facility] Physical Therapy [address] [place], [place], [address] Phone: [phone]   Fax:  [phone]  Physical Therapy Treatment  Patient Details  Name: [name] MRN: [id number] Date of Birth: Oct 30, 1945 Referring Provider (PT): Marlou Sa Tonna Corner, MD   Encounter Date: 08/15/2019  PT End of Session - 08/15/19 1812    Visit Number  3    Number of Visits  12    Date for PT Re-Evaluation  10/31/19    Authorization Type  [employer] MCR    Authorization - Visit Number  3    Authorization - Number of Visits  12    PT Start Time  [id number]    PT Stop Time  1610    PT Time Calculation (min)  41 min    Activity Tolerance  Patient tolerated treatment well    Behavior During Therapy  [facility] for tasks assessed/performed       Past Medical History:  Diagnosis Date  . Anxiety   . Arthritis    osteoarthritis left knee, right shoulder "locks" occ.  . Diabetes mellitus without complication ([place])   . H/O seasonal allergies   . History of hiatal hernia   . Hypertension   . Pneumonia    as a child  . Sleep apnea    no cpap-continues to be evaluated    Past Surgical History:  Procedure Laterality Date  . ABDOMINAL HYSTERECTOMY    . BACK SURGERY     lumbar fusion  . CATARACT EXTRACTION, BILATERAL    . CHOLECYSTECTOMY    . COLONOSCOPY WITH PROPOFOL N/A 07/30/2017   Procedure: COLONOSCOPY WITH PROPOFOL;  Surgeon: Juanita Craver, MD;  Location: [facility];  Service: Endoscopy;  Laterality: N/A;  . EXPLORATORY LAPAROTOMY     ovaries freed from intestines.  Marland Kitchen FOOT SURGERY     retained hardware.  Marland Kitchen KNEE ARTHROSCOPY Right    scope surgery  . TONSILLECTOMY    . TOTAL KNEE ARTHROPLASTY Left 02/16/2015   Procedure: LEFT TOTAL KNEE ARTHROPLASTY;  Surgeon: Sydnee Cabal, MD;  Location: [facility];  Service: Orthopedics;  Laterality: Left;  . TOTAL SHOULDER ARTHROPLASTY Right 07/26/2019   Procedure: RIGHT TOTAL SHOULDER ARTHROPLASTY;  Surgeon: Meredith Pel, MD;   Location: [place];  Service: Orthopedics;  Laterality: Right;  . WRIST SURGERY Left    x2 "cysts"    There were no vitals filed for this visit.  Subjective Assessment - 08/15/19 1606    Subjective  sore from the exercises but no pain if she is not using it.    Pertinent History  PMH: Rt TSA 07/26/19, DM, sleep apnea, lumbar fusion 13 years ago, Lt TKA 4 years ago    Limitations  Lifting;Standing;Walking;House hold activities    Patient Stated Goals  reduce pain, regain function in Rt shoulder    Pain Onset  1 to 4 weeks ago            [facility] Adult PT Treatment/Exercise - 08/15/19 0001      Shoulder Exercises: Supine   Protraction  10 reps    External Rotation  10 reps;AAROM    Flexion  AAROM;10 reps    Flexion Limitations  hands clasped    ABduction  AAROM;10 reps      Shoulder Exercises: Standing   Retraction  10 reps    Other Standing Exercises  Rt elbow AROM 20 reps    Other Standing Exercises  Standing ball rolls for AAROM flexion, scaption, ER 10 reps ea on  Rt shoulder. Rt shoulder flexion isometrics 5 sec X 10 reps      Modalities   Modalities  Vasopneumatic      Vasopneumatic   Number Minutes Vasopneumatic   10 minutes    Vasopnuematic Location   Shoulder    Vasopneumatic Pressure  Medium    Vasopneumatic Temperature   34      Manual Therapy   Manual therapy comments  Rt shoulder PROM gentle all planes to tolerance               PT Short Term Goals - 08/08/19 1552      PT SHORT TERM GOAL #1   Title  Pt will be I and compliant with initial HEP.    Time  4    Period  Weeks    Status  New    Target Date  09/05/19      PT SHORT TERM GOAL #2   Title  Pt will improve Rt shoulder PROM to [facility] as protocol allows.    Time  4    Period  Weeks    Status  New        PT Long Term Goals - 08/08/19 1553      PT LONG TERM GOAL #1   Title  Pt will be I and compliant with HEP progresion. (Target for all goals 12 weeks 10/31/19))    Time  12    Period   Weeks    Status  New      PT LONG TERM GOAL #2   Title  Pt will improve Rt shoulder AROM to [facility] (at least 140 deg flexion and abduction)    Status  New      PT LONG TERM GOAL #3   Title  Pt will improve Rt shoulder strength to at least 4+/5 to improve function.    Status  New      PT LONG TERM GOAL #4   Title  Pt will reduce overall pain less than 3/10 with ususal activity.    Status  New            Plan - 08/15/19 1812    Clinical Impression Statement  She continues to make good progress with ROM and overall activity tolerance but still limited in these areas and will continue to benefit from skilled PT. Used Vaso post tx to reduce pain and swelling after activity.   Personal Factors and Comorbidities  Comorbidity 2;Time since onset of injury/illness/exacerbation;Fitness    Comorbidities  PMH: Rt TSA 07/26/19, DM, sleep apnea, lumbar fusion 13 years ago, Lt TKA 4 years ago    Examination-Activity Limitations  Bend;Carry;Squat;Stairs;Sleep;Locomotion Level;Lift;Stand;Transfers    Examination-Participation Restrictions  Cleaning;Community Activity;Shop;Laundry    Stability/Clinical Decision Making  Evolving/Moderate complexity    Rehab Potential  Good    PT Frequency  2x / week   2-3   PT Duration  12 weeks    PT Treatment/Interventions  ADLs/Self Care Home Management;Aquatic Therapy;Cryotherapy;Electrical Stimulation;Iontophoresis 4mg /ml Dexamethasone;Moist Heat;Ultrasound;Gait training;Therapeutic activities;Therapeutic exercise;Neuromuscular re-education;Patient/family education;Manual techniques;Passive range of motion;Dry needling;Energy conservation;Joint Manipulations;Taping    PT Next Visit Plan  review and update HEP PRN, progress as able per protocol and restrictions    PT Home Exercise Plan  access code: [email or web address] Code: [email or web address]: [email or web address]: 04/29/2021Prepared by: Herbie Baltimore LovellExercisesStanding Scapular Retraction - 5 x daily - 7 x  weekly - 1 sets - 5 reps - 5 holdSupine Bilateral Shoulder Protraction - 2 x daily - 7 x weekly - 2 sets -  10 reps - 3 second hold    Consulted and Agree with Plan of Care  Patient       Patient will benefit from skilled therapeutic intervention in order to improve the following deficits and impairments:  Decreased activity tolerance, Decreased endurance, Decreased mobility, Decreased range of motion, Decreased strength, Impaired flexibility, Increased muscle spasms, Obesity, Postural dysfunction, Pain, Increased fascial restricitons  Visit Diagnosis: Chronic right shoulder pain  Stiffness of right shoulder, not elsewhere classified  Muscle weakness (generalized)     Problem List Patient Active Problem List   Diagnosis Date Noted  . 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 Osteoarthritis of left knee 02/16/2015  . OBESITY 10/25/2007  . HIATAL HERNIA 10/25/2007  . ABDOMINAL PAIN-EPIGASTRIC 10/25/2007    Silvestre Mesi 08/15/2019, 6:14 PM  [facility] Physical Therapy [address] [place], [place], [address] Phone: [phone]   Fax:  [phone]  Name: [name] MRN: [id number] Date of Birth: Mar 24, 1946

## 2019-08-18 ENCOUNTER — Ambulatory Visit (INDEPENDENT_AMBULATORY_CARE_PROVIDER_SITE_OTHER): Payer: Medicare PPO | Admitting: Physical Therapy

## 2019-08-18 ENCOUNTER — Encounter: Payer: Self-pay | Admitting: Physical Therapy

## 2019-08-18 ENCOUNTER — Other Ambulatory Visit: Payer: Self-pay

## 2019-08-18 DIAGNOSIS — R600 Localized edema: Secondary | ICD-10-CM

## 2019-08-18 DIAGNOSIS — M25511 Pain in right shoulder: Secondary | ICD-10-CM | POA: Diagnosis not present

## 2019-08-18 DIAGNOSIS — M25611 Stiffness of right shoulder, not elsewhere classified: Secondary | ICD-10-CM | POA: Diagnosis not present

## 2019-08-18 DIAGNOSIS — M6281 Muscle weakness (generalized): Secondary | ICD-10-CM

## 2019-08-18 DIAGNOSIS — M545 Low back pain, unspecified: Secondary | ICD-10-CM

## 2019-08-18 DIAGNOSIS — M25561 Pain in right knee: Secondary | ICD-10-CM

## 2019-08-18 DIAGNOSIS — M25562 Pain in left knee: Secondary | ICD-10-CM

## 2019-08-18 DIAGNOSIS — G8929 Other chronic pain: Secondary | ICD-10-CM

## 2019-08-18 NOTE — Therapy (Signed)
[facility] Physical Therapy [address] [place], [place], [address] Phone: [phone]   Fax:  [phone]  Physical Therapy Treatment  Patient Details  Name: [name] MRN: [id number] Date of Birth: 17-Feb-1946 Referring Provider (PT): [place] Sa Tonna Corner, MD   Encounter Date: 08/18/2019  PT End of Session - 08/18/19 1014    Visit Number  4    Number of Visits  12    Date for PT Re-Evaluation  10/31/19    Authorization Type  [employer] MCR    Authorization - Visit Number  4    Authorization - Number of Visits  12    PT Start Time  0930    PT Stop Time  1012    PT Time Calculation (min)  42 min    Equipment Utilized During Treatment  Other (comment)    Activity Tolerance  Patient tolerated treatment well       Past Medical History:  Diagnosis Date  . Anxiety   . Arthritis    osteoarthritis left knee, right shoulder "locks" occ.  . Diabetes mellitus without complication ([place])   . H/O seasonal allergies   . History of hiatal hernia   . Hypertension   . Pneumonia    as a child  . Sleep apnea    no cpap-continues to be evaluated    Past Surgical History:  Procedure Laterality Date  . ABDOMINAL HYSTERECTOMY    . BACK SURGERY     lumbar fusion  . CATARACT EXTRACTION, BILATERAL    . CHOLECYSTECTOMY    . COLONOSCOPY WITH PROPOFOL N/A 07/30/2017   Procedure: COLONOSCOPY WITH PROPOFOL;  Surgeon: Juanita Craver, MD;  Location: [facility];  Service: Endoscopy;  Laterality: N/A;  . EXPLORATORY LAPAROTOMY     ovaries freed from intestines.  Marland Kitchen FOOT SURGERY     retained hardware.  Marland Kitchen KNEE ARTHROSCOPY Right    scope surgery  . TONSILLECTOMY    . TOTAL KNEE ARTHROPLASTY Left 02/16/2015   Procedure: LEFT TOTAL KNEE ARTHROPLASTY;  Surgeon: Sydnee Cabal, MD;  [facility]: [facility];  Service: Orthopedics;  Laterality: Left;  . TOTAL SHOULDER ARTHROPLASTY Right 07/26/2019   Procedure: RIGHT TOTAL SHOULDER ARTHROPLASTY;  Surgeon: Meredith Pel, MD;   Location: [place];  Service: Orthopedics;  Laterality: Right;  . WRIST SURGERY Left    x2 "cysts"    There were no vitals filed for this visit.  Subjective Assessment - 08/18/19 1013    Subjective  Pt arrivin to therpay reporting 3-4/10 pain in her R shoulder.    Pertinent History  PMH: Rt TSA 07/26/19, DM, sleep apnea, lumbar fusion 13 years ago, Lt TKA 4 years ago    Limitations  Lifting;Standing;Walking;House hold activities    Patient Stated Goals  reduce pain, regain function in Rt shoulder    Currently in Pain?  Yes    Pain Score  4     Pain Location  Shoulder    Pain Orientation  Right    Pain Descriptors / Indicators  Aching;Sore    Pain Type  Surgical pain;Acute pain    Pain Onset  1 to 4 weeks ago                       [facility] Adult PT Treatment/Exercise - 08/18/19 0001      Shoulder Exercises: Supine   Protraction  10 reps    External Rotation  10 reps;AAROM    Flexion  AAROM;10 reps    Flexion  Limitations  hands clasped    ABduction  AAROM;10 reps      Shoulder Exercises: Standing   Retraction  10 reps    Other Standing Exercises  Rt elbow AROM 20 reps      Manual Therapy   Manual therapy comments  Rt shoulder PROM gentle all planes to tolerance x 10 minutes               PT Short Term Goals - 08/08/19 1552      PT SHORT TERM GOAL #1   Title  Pt will be I and compliant with initial HEP.    Time  4    Period  Weeks    Status  New    Target Date  09/05/19      PT SHORT TERM GOAL #2   Title  Pt will improve Rt shoulder PROM to [facility] as protocol allows.    Time  4    Period  Weeks    Status  New        PT Long Term Goals - 08/18/19 1015      PT LONG TERM GOAL #1   Title  Pt will be I and compliant with HEP progresion. (Target for all goals 12 weeks 10/31/19))    Status  On-going      PT LONG TERM GOAL #2   Title  Pt will improve Rt shoulder AROM to [facility] (at least 140 deg flexion and abduction)    Status  On-going      PT LONG  TERM GOAL #3   Title  Pt will improve Rt shoulder strength to at least 4+/5 to improve function.    Status  On-going      PT LONG TERM GOAL #4   Title  Pt will reduce overall pain less than 3/10 with ususal activity.    Status  On-going            Plan - 08/18/19 1016    Clinical Impression Statement  Pt is making great progress with exercise tolerance. Pt still reporting pain with end ranges during PROM. Continue skilled PT.    Personal Factors and Comorbidities  Comorbidity 2;Time since onset of injury/illness/exacerbation;Fitness    Comorbidities  PMH: Rt TSA 07/26/19, DM, sleep apnea, lumbar fusion 13 years ago, Lt TKA 4 years ago    Examination-Activity Limitations  Bend;Carry;Squat;Stairs;Sleep;Locomotion Level;Lift;Stand;Transfers    Examination-Participation Restrictions  Cleaning;Community Activity;Shop;Laundry    Stability/Clinical Decision Making  Evolving/Moderate complexity    Rehab Potential  Good    PT Frequency  2x / week    PT Duration  12 weeks    PT Treatment/Interventions  ADLs/Self Care Home Management;Aquatic Therapy;Cryotherapy;Electrical Stimulation;Iontophoresis 4mg /ml Dexamethasone;Moist Heat;Ultrasound;Gait training;Therapeutic activities;Therapeutic exercise;Neuromuscular re-education;Patient/family education;Manual techniques;Passive range of motion;Dry needling;Energy conservation;Joint Manipulations;Taping    PT Next Visit Plan  review and update HEP PRN, progress as able per protocol and restrictions    PT Home Exercise Plan  access code: [email or web address] Code: [email or web address]: [email or web address]: 04/29/2021Prepared by: Herbie Baltimore LovellExercisesStanding Scapular Retraction - 5 x daily - 7 x weekly - 1 sets - 5 reps - 5 holdSupine Bilateral Shoulder Protraction - 2 x daily - 7 x weekly - 2 sets - 10 reps - 3 second hold    Consulted and Agree with Plan of Care  Patient       Patient will benefit from skilled therapeutic intervention in  order to improve the following deficits and impairments:  Decreased activity tolerance, Decreased endurance, Decreased mobility, Decreased range  of motion, Decreased strength, Impaired flexibility, Increased muscle spasms, Obesity, Postural dysfunction, Pain, Increased fascial restricitons  Visit Diagnosis: Chronic right shoulder pain  Stiffness of right shoulder, not elsewhere classified  Muscle weakness (generalized)  Localized edema  Chronic bilateral low back pain, unspecified whether sciatica present  Chronic pain of right knee  Chronic pain of left knee     Problem List Patient Active Problem List   Diagnosis Date Noted  . 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 Osteoarthritis of left knee 02/16/2015  . OBESITY 10/25/2007  . HIATAL HERNIA 10/25/2007  . ABDOMINAL PAIN-EPIGASTRIC 10/25/2007    Oretha Caprice , PT, MPT 08/18/2019, 10:17 AM  [facility] Physical Therapy [address] [place], [place], [address] Phone: [phone]   Fax:  [phone]  Name: [name] MRN: [id number] Date of Birth: June 23, 1945

## 2019-08-22 ENCOUNTER — Encounter: Payer: Self-pay | Admitting: Physical Therapy

## 2019-08-22 ENCOUNTER — Ambulatory Visit (INDEPENDENT_AMBULATORY_CARE_PROVIDER_SITE_OTHER): Payer: Medicare PPO | Admitting: Physical Therapy

## 2019-08-22 ENCOUNTER — Other Ambulatory Visit: Payer: Self-pay

## 2019-08-22 DIAGNOSIS — M25511 Pain in right shoulder: Secondary | ICD-10-CM | POA: Diagnosis not present

## 2019-08-22 DIAGNOSIS — M25611 Stiffness of right shoulder, not elsewhere classified: Secondary | ICD-10-CM | POA: Diagnosis not present

## 2019-08-22 DIAGNOSIS — M545 Low back pain, unspecified: Secondary | ICD-10-CM

## 2019-08-22 DIAGNOSIS — G8929 Other chronic pain: Secondary | ICD-10-CM

## 2019-08-22 DIAGNOSIS — M25561 Pain in right knee: Secondary | ICD-10-CM

## 2019-08-22 DIAGNOSIS — M25562 Pain in left knee: Secondary | ICD-10-CM

## 2019-08-22 DIAGNOSIS — M6281 Muscle weakness (generalized): Secondary | ICD-10-CM

## 2019-08-22 DIAGNOSIS — R600 Localized edema: Secondary | ICD-10-CM | POA: Diagnosis not present

## 2019-08-22 NOTE — Therapy (Signed)
[facility] Physical Therapy [address] [place], [place], [address] Phone: [phone]   Fax:  [phone]  Physical Therapy Treatment  Patient Details  Name: [name] MRN: [id number] Date of Birth: 1945/05/22 Referring Provider (PT): [place] Sa Tonna Corner, MD   Encounter Date: 08/22/2019  PT End of Session - 08/22/19 0938    Visit Number  5    Number of Visits  12    Date for PT Re-Evaluation  10/31/19    Authorization Type  [employer] MCR    Authorization - Visit Number  5    Authorization - Number of Visits  12    PT Start Time  0930    PT Stop Time  1013    PT Time Calculation (min)  43 min    Equipment Utilized During Treatment  Other (comment)    Activity Tolerance  Patient tolerated treatment well    Behavior During Therapy  [facility] for tasks assessed/performed       Past Medical History:  Diagnosis Date  . Anxiety   . Arthritis    osteoarthritis left knee, right shoulder "locks" occ.  . Diabetes mellitus without complication ([place])   . H/O seasonal allergies   . History of hiatal hernia   . Hypertension   . Pneumonia    as a child  . Sleep apnea    no cpap-continues to be evaluated    Past Surgical History:  Procedure Laterality Date  . ABDOMINAL HYSTERECTOMY    . BACK SURGERY     lumbar fusion  . CATARACT EXTRACTION, BILATERAL    . CHOLECYSTECTOMY    . COLONOSCOPY WITH PROPOFOL N/A 07/30/2017   Procedure: COLONOSCOPY WITH PROPOFOL;  Surgeon: Juanita Craver, MD;  [facility];  Service: Endoscopy;  Laterality: N/A;  . EXPLORATORY LAPAROTOMY     ovaries freed from intestines.  Marland Kitchen FOOT SURGERY     retained hardware.  Marland Kitchen KNEE ARTHROSCOPY Right    scope surgery  . TONSILLECTOMY    . TOTAL KNEE ARTHROPLASTY Left 02/16/2015   Procedure: LEFT TOTAL KNEE ARTHROPLASTY;  Surgeon: Sydnee Cabal, MD;  [facility];  Service: Orthopedics;  Laterality: Left;  . TOTAL SHOULDER ARTHROPLASTY Right 07/26/2019   Procedure: RIGHT TOTAL  SHOULDER ARTHROPLASTY;  Surgeon: Meredith Pel, MD;  Location: [place];  Service: Orthopedics;  Laterality: Right;  . WRIST SURGERY Left    x2 "cysts"    There were no vitals filed for this visit.  Subjective Assessment - 08/22/19 0935    Subjective  Pt arriving to therapy reporting 2/10 pain in R shoulder. Pt reporting she has been up this morning cooking and feels that may have aggriviated her pain.    Pertinent History  PMH: Rt TSA 07/26/19, DM, sleep apnea, lumbar fusion 13 years ago, Lt TKA 4 years ago    Limitations  Lifting;Standing;Walking;House hold activities    Patient Stated Goals  reduce pain, regain function in Rt shoulder    Currently in Pain?  Yes    Pain Score  2     Pain Location  Shoulder    Pain Orientation  Right    Pain Descriptors / Indicators  Aching;Sore    Pain Type  Surgical pain;Acute pain    Pain Onset  More than a month ago    Aggravating Factors   moving certain positions    Pain Relieving Factors  resting, ice  [place] Adult PT Treatment/Exercise - 08/22/19 0001      Shoulder Exercises: Supine   Protraction  10 reps    External Rotation  10 reps;AAROM    Flexion  AAROM;10 reps    Flexion Limitations  hands clasped    ABduction  AAROM;10 reps      Shoulder Exercises: Standing   Retraction  10 reps    Other Standing Exercises  UE Ranger x 20 reps      Manual Therapy   Manual therapy comments  Rt shoulder PROM gentle all planes to tolerance x 10 minutes   following protocol limitations              PT Short Term Goals - 08/08/19 1552      PT SHORT TERM GOAL #1   Title  Pt will be I and compliant with initial HEP.    Time  4    Period  Weeks    Status  New    Target Date  09/05/19      PT SHORT TERM GOAL #2   Title  Pt will improve Rt shoulder PROM to [facility] as protocol allows.    Time  4    Period  Weeks    Status  New        PT Long Term Goals - 08/22/19 0939      PT LONG TERM GOAL  #1   Title  Pt will be I and compliant with HEP progresion. (Target for all goals 12 weeks 10/31/19))    Time  12    Period  Weeks    Status  On-going      PT LONG TERM GOAL #2   Title  Pt will improve Rt shoulder AROM to [facility] (at least 140 deg flexion and abduction)    Status  On-going      PT LONG TERM GOAL #3   Title  Pt will improve Rt shoulder strength to at least 4+/5 to improve function.    Status  On-going      PT LONG TERM GOAL #4   Title  Pt will reduce overall pain less than 3/10 with ususal activity.    Status  On-going            Plan - 08/22/19 [id number]    Clinical Impression Statement  Pt with 2/10 pain at beginning of session and increased pain reported during exercises of 3-4/10. Continue to progress pt toward her LTG's following pt's protocol.    Personal Factors and Comorbidities  Comorbidity 2;Time since onset of injury/illness/exacerbation;Fitness    Comorbidities  PMH: Rt TSA 07/26/19, DM, sleep apnea, lumbar fusion 13 years ago, Lt TKA 4 years ago    Examination-Activity Limitations  Bend;Carry;Squat;Stairs;Sleep;Locomotion Level;Lift;Stand;Transfers    Examination-Participation Restrictions  Cleaning;Community Activity;Shop;Laundry    Stability/Clinical Decision Making  Evolving/Moderate complexity    Rehab Potential  Good    PT Frequency  2x / week    PT Treatment/Interventions  ADLs/Self Care Home Management;Aquatic Therapy;Cryotherapy;Electrical Stimulation;Iontophoresis 4mg /ml Dexamethasone;Moist Heat;Ultrasound;Gait training;Therapeutic activities;Therapeutic exercise;Neuromuscular re-education;Patient/family education;Manual techniques;Passive range of motion;Dry needling;Energy conservation;Joint Manipulations;Taping    PT Next Visit Plan  review and update HEP PRN, progress as able per protocol and restrictions    PT Home Exercise Plan  access code: [email or web address] Code: [email or web address]: [email or web address]: 04/29/2021Prepared by: Herbie Baltimore  LovellExercisesStanding Scapular Retraction - 5 x daily - 7 x weekly - 1 sets - 5 reps - 5 holdSupine Bilateral Shoulder Protraction - 2 x daily - 7 x weekly -  2 sets - 10 reps - 3 second hold    Consulted and Agree with Plan of Care  Patient       Patient will benefit from skilled therapeutic intervention in order to improve the following deficits and impairments:  Decreased activity tolerance, Decreased endurance, Decreased mobility, Decreased range of motion, Decreased strength, Impaired flexibility, Increased muscle spasms, Obesity, Postural dysfunction, Pain, Increased fascial restricitons  Visit Diagnosis: Chronic right shoulder pain  Stiffness of right shoulder, not elsewhere classified  Muscle weakness (generalized)  Localized edema  Chronic bilateral low back pain, unspecified whether sciatica present  Chronic pain of right knee  Chronic pain of left knee     Problem List Patient Active Problem List   Diagnosis Date Noted  . 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 Osteoarthritis of left knee 02/16/2015  . OBESITY 10/25/2007  . HIATAL HERNIA 10/25/2007  . ABDOMINAL PAIN-EPIGASTRIC 10/25/2007    Oretha Caprice, PT, MPT 08/22/2019, 9:57 AM  [facility] Physical Therapy [address] [place], [place], [address] Phone: [phone]   Fax:  [phone]  Name: JOYLENE SOUCIE MRN: [id number] Date of Birth: 07-16-1945

## 2019-08-25 ENCOUNTER — Encounter: Payer: Self-pay | Admitting: Physical Therapy

## 2019-08-25 ENCOUNTER — Other Ambulatory Visit: Payer: Self-pay

## 2019-08-25 ENCOUNTER — Ambulatory Visit (INDEPENDENT_AMBULATORY_CARE_PROVIDER_SITE_OTHER): Payer: Medicare PPO | Admitting: Physical Therapy

## 2019-08-25 DIAGNOSIS — M25511 Pain in right shoulder: Secondary | ICD-10-CM

## 2019-08-25 DIAGNOSIS — R600 Localized edema: Secondary | ICD-10-CM

## 2019-08-25 DIAGNOSIS — M6281 Muscle weakness (generalized): Secondary | ICD-10-CM | POA: Diagnosis not present

## 2019-08-25 DIAGNOSIS — M25562 Pain in left knee: Secondary | ICD-10-CM

## 2019-08-25 DIAGNOSIS — M545 Low back pain, unspecified: Secondary | ICD-10-CM

## 2019-08-25 DIAGNOSIS — G8929 Other chronic pain: Secondary | ICD-10-CM

## 2019-08-25 DIAGNOSIS — M25561 Pain in right knee: Secondary | ICD-10-CM

## 2019-08-25 DIAGNOSIS — M25611 Stiffness of right shoulder, not elsewhere classified: Secondary | ICD-10-CM

## 2019-08-25 NOTE — Therapy (Signed)
[facility] Physical Therapy [address] [place], [place], [address] Phone: [phone]   Fax:  [phone]  Physical Therapy Treatment  Patient Details  Name: [name] MRN: [id number] Date of Birth: [birth year] Referring Provider (PT): Marlou Sa Tonna Corner, MD   Encounter Date: 08/25/2019  PT End of Session - 08/25/19 1437    Visit Number  6    Number of Visits  12    Date for PT Re-Evaluation  10/31/19    Authorization Type  [employer] MCR    Authorization - Visit Number  6    Authorization - Number of Visits  12    PT Start Time  [id number]    PT Stop Time  1516    PT Time Calculation (min)  47 min    Equipment Utilized During Treatment  Other (comment)    Activity Tolerance  Patient tolerated treatment well    Behavior During Therapy  [facility] for tasks assessed/performed       Past Medical History:  Diagnosis Date  . Anxiety   . Arthritis    osteoarthritis left knee, right shoulder "locks" occ.  . Diabetes mellitus without complication ([name])   . H/O seasonal allergies   . History of hiatal hernia   . Hypertension   . Pneumonia    as a child  . Sleep apnea    no cpap-continues to be evaluated    Past Surgical History:  Procedure Laterality Date  . ABDOMINAL HYSTERECTOMY    . BACK SURGERY     lumbar fusion  . CATARACT EXTRACTION, BILATERAL    . CHOLECYSTECTOMY    . COLONOSCOPY WITH PROPOFOL N/A 07/30/2017   Procedure: COLONOSCOPY WITH PROPOFOL;  Surgeon: Juanita Craver, MD;  Location: [facility];  Service: Endoscopy;  Laterality: N/A;  . EXPLORATORY LAPAROTOMY     ovaries freed from intestines.  [name] Kitchen FOOT SURGERY     retained hardware.  [name] Kitchen KNEE ARTHROSCOPY Right    scope surgery  . TONSILLECTOMY    . TOTAL KNEE ARTHROPLASTY Left 02/16/2015   Procedure: LEFT TOTAL KNEE ARTHROPLASTY;  Surgeon: Sydnee Cabal, MD;  Location: [facility];  Service: Orthopedics;  Laterality: Left;  . TOTAL SHOULDER ARTHROPLASTY Right 07/26/2019   Procedure: RIGHT TOTAL  SHOULDER ARTHROPLASTY;  Surgeon: Meredith Pel, MD;  Location: [place];  Service: Orthopedics;  Laterality: Right;  . WRIST SURGERY Left    x2 "cysts"    There were no vitals filed for this visit.  Subjective Assessment - 08/25/19 1435    Subjective  [name] arriving to therapy reporting no pain. Pt reporting sleeping "real good last night". Pt reporting soreness after her exercises this morning, but feeling much better now.    Pertinent History  PMH: Rt TSA 07/26/19, DM, sleep apnea, lumbar fusion 13 years ago, Lt TKA 4 years ago    Limitations  Lifting;Standing;Walking;House hold activities    Patient Stated Goals  reduce pain, regain function in Rt shoulder    Currently in Pain?  No/denies         [facility] PT Assessment - 08/25/19 0001      Assessment   Medical Diagnosis  S/P Rt TSA 07/26/19    Referring Provider (PT)  Marlou Sa Tonna Corner, MD    Onset Date/Surgical Date  07/26/19    Hand Dominance  Right      PROM   Right Shoulder Flexion  155 Degrees    Right Shoulder External Rotation  55 Degrees  [facility] Adult PT Treatment/Exercise - 08/25/19 0001      Shoulder Exercises: Supine   Protraction  10 reps    External Rotation  AAROM;20 reps    Flexion  AAROM;20 reps      Shoulder Exercises: Standing   Retraction  10 reps    Other Standing Exercises  UE Ranger x 20 reps, fleixon, circles both directions    Other Standing Exercises  wall slides, wall ladder       Shoulder Exercises: Pulleys   Flexion  2 minutes      Modalities   Modalities  Cryotherapy      Cryotherapy   Number Minutes Cryotherapy  5 Minutes    Cryotherapy Location  Shoulder    Type of Cryotherapy  Ice pack      Manual Therapy   Manual therapy comments  Rt shoulder PROM gentle all planes to tolerance x 10 minutes               PT Short Term Goals - 08/25/19 1456      PT SHORT TERM GOAL #1   Title  Pt will be I and compliant with initial HEP.    Period   Weeks    Status  On-going      PT SHORT TERM GOAL #2   Title  Pt will improve Rt shoulder PROM to [facility] as protocol allows.    Status  On-going        PT Long Term Goals - 08/25/19 1456      PT LONG TERM GOAL #1   Title  Pt will be I and compliant with HEP progresion. (Target for all goals 12 weeks 10/31/19))    Time  12    Status  On-going      PT LONG TERM GOAL #2   Title  Pt will improve Rt shoulder AROM to [facility] (at least 140 deg flexion and abduction)    Status  On-going      PT LONG TERM GOAL #3   Title  Pt will improve Rt shoulder strength to at least 4+/5 to improve function.    Status  On-going      PT LONG TERM GOAL #4   Title  Pt will reduce overall pain less than 3/10 with ususal activity.    Status  On-going            Plan - 08/25/19 1451    Clinical Impression Statement  [name] is making excellent progress with R shoulder flexion no pain reported upon arrival to PT, mild increase in pain with end ranges. R shoulder fleixon: 155 degrees, R shoulder ER: 55 degrees. Continue with skilled PT.    Personal Factors and Comorbidities  Comorbidity 2;Time since onset of injury/illness/exacerbation;Fitness    Comorbidities  PMH: Rt TSA 07/26/19, DM, sleep apnea, lumbar fusion 13 years ago, Lt TKA 4 years ago    Examination-Activity Limitations  Bend;Carry;Squat;Stairs;Sleep;Locomotion Level;Lift;Stand;Transfers    Examination-Participation Restrictions  Cleaning;Community Activity;Shop;Laundry    Stability/Clinical Decision Making  Evolving/Moderate complexity    Rehab Potential  Good    PT Frequency  2x / week    PT Duration  12 weeks    PT Treatment/Interventions  ADLs/Self Care Home Management;Aquatic Therapy;Cryotherapy;Electrical Stimulation;Iontophoresis 4mg /ml Dexamethasone;Moist Heat;Ultrasound;Gait training;Therapeutic activities;Therapeutic exercise;Neuromuscular re-education;Patient/family education;Manual techniques;Passive range of motion;Dry needling;Energy  conservation;Joint Manipulations;Taping    PT Next Visit Plan  review and update HEP PRN, progress as able per protocol and restrictions    PT Home Exercise Plan  access code: [email or web address] Code: [email or web address]: [email or web address]: 04/29/2021Prepared by: Herbie Baltimore LovellExercisesStanding Scapular Retraction - 5 x daily - 7 x weekly - 1 sets - 5 reps - 5 holdSupine Bilateral Shoulder Protraction - 2 x daily - 7 x weekly - 2 sets - 10 reps - 3 second hold    Consulted and Agree with Plan of Care  Patient       Patient will benefit from skilled therapeutic intervention in order to improve the following deficits and impairments:  Decreased activity tolerance, Decreased endurance, Decreased mobility, Decreased range of motion, Decreased strength, Impaired flexibility, Increased muscle spasms, Obesity, Postural dysfunction, Pain, Increased fascial restricitons  Visit Diagnosis: Chronic right shoulder pain  Stiffness of right shoulder, not elsewhere classified  Muscle weakness (generalized)  Localized edema  Chronic bilateral low back pain, unspecified whether sciatica present  Chronic pain of right knee  Chronic pain of left knee     Problem List Patient Active Problem List   Diagnosis Date Noted  . 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 Osteoarthritis of left knee 02/16/2015  . OBESITY 10/25/2007  . HIATAL HERNIA 10/25/2007  . ABDOMINAL PAIN-EPIGASTRIC 10/25/2007    Oretha Caprice, PT, MPT 08/25/2019, 3:17 PM  [facility] Physical Therapy [address] [place], [place], [address] Phone: [phone]   Fax:  [phone]  Name: [name] MRN: [id number] Date of Birth: 12-Sep-1945

## 2019-08-29 ENCOUNTER — Encounter: Payer: Self-pay | Admitting: Physical Therapy

## 2019-08-29 ENCOUNTER — Other Ambulatory Visit: Payer: Self-pay

## 2019-08-29 ENCOUNTER — Ambulatory Visit (INDEPENDENT_AMBULATORY_CARE_PROVIDER_SITE_OTHER): Payer: Medicare PPO | Admitting: Physical Therapy

## 2019-08-29 DIAGNOSIS — M6281 Muscle weakness (generalized): Secondary | ICD-10-CM

## 2019-08-29 DIAGNOSIS — G8929 Other chronic pain: Secondary | ICD-10-CM

## 2019-08-29 DIAGNOSIS — M25511 Pain in right shoulder: Secondary | ICD-10-CM

## 2019-08-29 DIAGNOSIS — R600 Localized edema: Secondary | ICD-10-CM | POA: Diagnosis not present

## 2019-08-29 DIAGNOSIS — M545 Low back pain, unspecified: Secondary | ICD-10-CM

## 2019-08-29 DIAGNOSIS — M25611 Stiffness of right shoulder, not elsewhere classified: Secondary | ICD-10-CM | POA: Diagnosis not present

## 2019-08-29 DIAGNOSIS — M25562 Pain in left knee: Secondary | ICD-10-CM

## 2019-08-29 DIAGNOSIS — M25561 Pain in right knee: Secondary | ICD-10-CM

## 2019-08-29 NOTE — Therapy (Addendum)
[facility] Physical Therapy [address] [place], [place], [address] Phone: [phone]   Fax:  [phone]  Physical Therapy Treatment  Patient Details  Name: FIORE KUTZKE MRN: [id number] Date of Birth: [birth year] Referring Provider (PT): Marlou Sa Tonna Corner, MD   Encounter Date: 08/29/2019  PT End of Session - 08/29/19 1107    Visit Number  7    Number of Visits  12    Date for PT Re-Evaluation  10/31/19    Authorization Type  [employer] MCR    Authorization - Visit Number  7    Authorization - Number of Visits  12    PT Start Time  0930    PT Stop Time  1018    PT Time Calculation (min)  48 min       Past Medical History:  Diagnosis Date  . Anxiety   . Arthritis    osteoarthritis left knee, right shoulder "locks" occ.  . Diabetes mellitus without complication ([place])   . H/O seasonal allergies   . History of hiatal hernia   . Hypertension   . Pneumonia    as a child  . Sleep apnea    no cpap-continues to be evaluated    Past Surgical History:  Procedure Laterality Date  . ABDOMINAL HYSTERECTOMY    . BACK SURGERY     lumbar fusion  . CATARACT EXTRACTION, BILATERAL    . CHOLECYSTECTOMY    . COLONOSCOPY WITH PROPOFOL N/A 07/30/2017   Procedure: COLONOSCOPY WITH PROPOFOL;  Surgeon: Juanita Craver, MD;  [facility];  Service: Endoscopy;  Laterality: N/A;  . EXPLORATORY LAPAROTOMY     ovaries freed from intestines.  Marland Kitchen FOOT SURGERY     retained hardware.  Marland Kitchen KNEE ARTHROSCOPY Right    scope surgery  . TONSILLECTOMY    . TOTAL KNEE ARTHROPLASTY Left 02/16/2015   Procedure: LEFT TOTAL KNEE ARTHROPLASTY;  Surgeon: Sydnee Cabal, MD;  [facility];  Service: Orthopedics;  Laterality: Left;  . TOTAL SHOULDER ARTHROPLASTY Right 07/26/2019   Procedure: RIGHT TOTAL SHOULDER ARTHROPLASTY;  Surgeon: Meredith Pel, MD;  Location: [place];  Service: Orthopedics;  Laterality: Right;  . WRIST SURGERY Left    x2 "cysts"    There were no  vitals filed for this visit.  Subjective Assessment - 08/29/19 1103    Subjective  Pt arriving today reporting increased pain in shoulder after, "alot of stretching over the weekend". Pt reporting she did extra exercises.    Pertinent History  PMH: Rt TSA 07/26/19, DM, sleep apnea, lumbar fusion 13 years ago, Lt TKA 4 years ago    Limitations  Lifting;Standing;Walking;House hold activities    Patient Stated Goals  reduce pain, regain function in Rt shoulder    Currently in Pain?  Yes    Pain Score  5     Pain Location  Shoulder    Pain Orientation  Right    Pain Descriptors / Indicators  Aching;Sore    Pain Onset  More than a month ago    Pain Frequency  Intermittent         [facility] PT Assessment - 08/29/19 0001      Assessment   Medical Diagnosis  S/P Rt TSA 07/26/19    Referring Provider (PT)  Meredith Pel, MD    Onset Date/Surgical Date  07/26/19    Hand Dominance  Right      PROM   Right Shoulder Flexion  160 Degrees    Right Shoulder  External Rotation  58 Degrees                    [facility] Adult PT Treatment/Exercise - 08/29/19 0001      Exercises   Exercises  Other Exercises    Other Exercises   Step up on 4 inch step using L UE for support x 5 leading with R LE      Shoulder Exercises: Supine   External Rotation  AAROM;20 reps    Flexion  AAROM;20 reps      Shoulder Exercises: Standing   Retraction  10 reps    Other Standing Exercises  UE Ranger circles both directions, ball on wall rolling up and down using bilateral UE's x 10     Other Standing Exercises  wall slides x 20 reps, wall ladder x 10 reps      Shoulder Exercises: Pulleys   Flexion  2 minutes      Vasopneumatic   Number Minutes Vasopneumatic   10 minutes    Vasopnuematic Location   Shoulder    Vasopneumatic Pressure  Low    Vasopneumatic Temperature   34      Manual Therapy   Manual therapy comments  Rt shoulder PROM gentle all planes to tolerance x 8 minutes              PT Education - 08/29/19 1114    Education Details  continue to ice and to let pain be her judge for stretching.    Person(s) Educated  Patient    Methods  Explanation    Comprehension  Verbalized understanding       PT Short Term Goals - 08/25/19 1456      PT SHORT TERM GOAL #1   Title  Pt will be I and compliant with initial HEP.    Period  Weeks    Status  On-going      PT SHORT TERM GOAL #2   Title  Pt will improve Rt shoulder PROM to [facility] as protocol allows.    Status  On-going        PT Long Term Goals - 08/25/19 1456      PT LONG TERM GOAL #1   Title  Pt will be I and compliant with HEP progresion. (Target for all goals 12 weeks 10/31/19))    Time  12    Status  On-going      PT LONG TERM GOAL #2   Title  Pt will improve Rt shoulder AROM to [facility] (at least 140 deg flexion and abduction)    Status  On-going      PT LONG TERM GOAL #3   Title  Pt will improve Rt shoulder strength to at least 4+/5 to improve function.    Status  On-going      PT LONG TERM GOAL #4   Title  Pt will reduce overall pain less than 3/10 with ususal activity.    Status  On-going            Plan - 08/29/19 1124    Clinical Impression Statement  Pt arriving reporting she feels like she over did it over the weekend with stretching and exercises. Pt reporting 5/10 pain in her R shoulder. Pt tolerating all exercises well with improvement in R shoulder fleixon of 160 degrees and R ER to 58 degrees. Pt making great progress. Continue skilled PT.    Personal Factors and Comorbidities  Comorbidity 2;Time since onset of injury/illness/exacerbation;Fitness  Comorbidities  PMH: Rt TSA 07/26/19, DM, sleep apnea, lumbar fusion 13 years ago, Lt TKA 4 years ago    Examination-Activity Limitations  Bend;Carry;Squat;Stairs;Sleep;Locomotion Level;Lift;Stand;Transfers    Examination-Participation Restrictions  Cleaning;Community Activity;Shop;Laundry    Stability/Clinical Decision Making   Evolving/Moderate complexity    Rehab Potential  Good    PT Frequency  2x / week    PT Duration  12 weeks    PT Treatment/Interventions  ADLs/Self Care Home Management;Aquatic Therapy;Cryotherapy;Electrical Stimulation;Iontophoresis 4mg /ml Dexamethasone;Moist Heat;Ultrasound;Gait training;Therapeutic activities;Therapeutic exercise;Neuromuscular re-education;Patient/family education;Manual techniques;Passive range of motion;Dry needling;Energy conservation;Joint Manipulations;Taping    PT Next Visit Plan  review and update HEP PRN, progress as able per protocol and restrictions    PT Home Exercise Plan  access code: [email or web address] Code: [email or web address]: [email or web address]: 04/29/2021Prepared by: Herbie Baltimore LovellExercisesStanding Scapular Retraction - 5 x daily - 7 x weekly - 1 sets - 5 reps - 5 holdSupine Bilateral Shoulder Protraction - 2 x daily - 7 x weekly - 2 sets - 10 reps - 3 second hold    Consulted and Agree with Plan of Care  Patient       Patient will benefit from skilled therapeutic intervention in order to improve the following deficits and impairments:  Decreased activity tolerance, Decreased endurance, Decreased mobility, Decreased range of motion, Decreased strength, Impaired flexibility, Increased muscle spasms, Obesity, Postural dysfunction, Pain, Increased fascial restricitons  Visit Diagnosis: Chronic right shoulder pain  Stiffness of right shoulder, not elsewhere classified  Muscle weakness (generalized)  Localized edema  Chronic bilateral low back pain, unspecified whether sciatica present  Chronic pain of right knee  Chronic pain of left knee     Problem List Patient Active Problem List   Diagnosis Date Noted  . 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 Osteoarthritis of left knee 02/16/2015  . OBESITY 10/25/2007  . HIATAL HERNIA 10/25/2007  . ABDOMINAL PAIN-EPIGASTRIC 10/25/2007    Oretha Caprice, PT, MPT 08/29/2019, 11:31 AM  [facility] Physical Therapy [address] [place], [place], [address] Phone: [phone]   Fax:  [phone]  Name: LOWANA MONIER MRN: [id number] Date of Birth: 1945/08/14

## 2019-09-01 ENCOUNTER — Ambulatory Visit (INDEPENDENT_AMBULATORY_CARE_PROVIDER_SITE_OTHER): Payer: Medicare PPO | Admitting: Physical Therapy

## 2019-09-01 ENCOUNTER — Encounter: Payer: Self-pay | Admitting: Physical Therapy

## 2019-09-01 ENCOUNTER — Other Ambulatory Visit: Payer: Self-pay

## 2019-09-01 DIAGNOSIS — M6281 Muscle weakness (generalized): Secondary | ICD-10-CM | POA: Diagnosis not present

## 2019-09-01 DIAGNOSIS — R600 Localized edema: Secondary | ICD-10-CM

## 2019-09-01 DIAGNOSIS — M25511 Pain in right shoulder: Secondary | ICD-10-CM

## 2019-09-01 DIAGNOSIS — M25611 Stiffness of right shoulder, not elsewhere classified: Secondary | ICD-10-CM | POA: Diagnosis not present

## 2019-09-01 DIAGNOSIS — G8929 Other chronic pain: Secondary | ICD-10-CM

## 2019-09-01 NOTE — Therapy (Signed)
[facility] Physical Therapy [address] [place], [place], [address] Phone: [phone]   Fax:  [phone]  Physical Therapy Treatment  Patient Details  Name: [name] MRN: [id number] Date of Birth: 08-27-1945 Referring Provider (PT): Marlou Sa Tonna Corner, MD   Encounter Date: 09/01/2019  PT End of Session - 09/01/19 1509    Visit Number  8    Number of Visits  12    Date for PT Re-Evaluation  10/31/19    Authorization Type  [employer] MCR    Authorization - Visit Number  8    Authorization - Number of Visits  12    PT Start Time  1440   pt arrived late   PT Stop Time  1513    PT Time Calculation (min)  33 min    Activity Tolerance  Patient tolerated treatment well    Behavior During Therapy  [facility] for tasks assessed/performed       Past Medical History:  Diagnosis Date  . Anxiety   . Arthritis    osteoarthritis left knee, right shoulder "locks" occ.  . Diabetes mellitus without complication ([place])   . H/O seasonal allergies   . History of hiatal hernia   . Hypertension   . Pneumonia    as a child  . Sleep apnea    no cpap-continues to be evaluated    Past Surgical History:  Procedure Laterality Date  . ABDOMINAL HYSTERECTOMY    . BACK SURGERY     lumbar fusion  . CATARACT EXTRACTION, BILATERAL    . CHOLECYSTECTOMY    . COLONOSCOPY WITH PROPOFOL N/A 07/30/2017   Procedure: COLONOSCOPY WITH PROPOFOL;  Surgeon: Juanita Craver, MD;  [facility];  Service: Endoscopy;  Laterality: N/A;  . EXPLORATORY LAPAROTOMY     ovaries freed from intestines.  Marland Kitchen FOOT SURGERY     retained hardware.  Marland Kitchen KNEE ARTHROSCOPY Right    scope surgery  . TONSILLECTOMY    . TOTAL KNEE ARTHROPLASTY Left 02/16/2015   Procedure: LEFT TOTAL KNEE ARTHROPLASTY;  Surgeon: Sydnee Cabal, MD;  [facility];  Service: Orthopedics;  Laterality: Left;  . TOTAL SHOULDER ARTHROPLASTY Right 07/26/2019   Procedure: RIGHT TOTAL SHOULDER ARTHROPLASTY;  Surgeon: Meredith Pel, MD;  Location: [place];  Service: Orthopedics;  Laterality: Right;  . WRIST SURGERY Left    x2 "cysts"    There were no vitals filed for this visit.  Subjective Assessment - 09/01/19 1442    Subjective  arrived late to session - stuck in traffic on the way here; still having trouble with external rotation    Pertinent History  PMH: Rt TSA 07/26/19, DM, sleep apnea, lumbar fusion 13 years ago, Lt TKA 4 years ago    Limitations  Lifting;Standing;Walking;House hold activities    Patient Stated Goals  reduce pain, regain function in Rt shoulder    Currently in Pain?  Yes    Pain Score  5     Pain Location  Shoulder    Pain Orientation  Right    Pain Descriptors / Indicators  Aching;Sore    Pain Type  Acute pain;Surgical pain    Pain Onset  More than a month ago    Pain Frequency  Intermittent    Aggravating Factors   moving certain positions    Pain Relieving Factors  resting, ice                        [facility]  Adult PT Treatment/Exercise - 09/01/19 1444      Shoulder Exercises: Supine   External Rotation  AAROM;20 reps   1# bar   Flexion  AAROM;20 reps   1# bar     Shoulder Exercises: Seated   Row  Both;20 reps      Shoulder Exercises: Standing   Other Standing Exercises  wall ladder x 10 reps      Shoulder Exercises: Pulleys   Flexion  2 minutes    Scaption  2 minutes      Manual Therapy   Manual therapy comments  Rt shoulder PROM gentle all planes to tolerance x 8 minutes               PT Short Term Goals - 08/25/19 1456      PT SHORT TERM GOAL #1   Title  Pt will be I and compliant with initial HEP.    Period  Weeks    Status  On-going      PT SHORT TERM GOAL #2   Title  Pt will improve Rt shoulder PROM to [facility] as protocol allows.    Status  On-going        PT Long Term Goals - 08/25/19 1456      PT LONG TERM GOAL #1   Title  Pt will be I and compliant with HEP progresion. (Target for all goals 12 weeks 10/31/19))     Time  12    Status  On-going      PT LONG TERM GOAL #2   Title  Pt will improve Rt shoulder AROM to [facility] (at least 140 deg flexion and abduction)    Status  On-going      PT LONG TERM GOAL #3   Title  Pt will improve Rt shoulder strength to at least 4+/5 to improve function.    Status  On-going      PT LONG TERM GOAL #4   Title  Pt will reduce overall pain less than 3/10 with ususal activity.    Status  On-going            Plan - 09/01/19 1510    Clinical Impression Statement  Pt arrived late to session, and overall progressing well with PT within protocol limitations.  Sees MD next week and will need progress note.  Will continue to benefit from PT to maximize function.    Personal Factors and Comorbidities  Comorbidity 2;Time since onset of injury/illness/exacerbation;Fitness    Comorbidities  PMH: Rt TSA 07/26/19, DM, sleep apnea, lumbar fusion 13 years ago, Lt TKA 4 years ago    Examination-Activity Limitations  Bend;Carry;Squat;Stairs;Sleep;Locomotion Level;Lift;Stand;Transfers    Examination-Participation Restrictions  Cleaning;Community Activity;Shop;Laundry    Stability/Clinical Decision Making  Evolving/Moderate complexity    Rehab Potential  Good    PT Frequency  2x / week    PT Duration  12 weeks    PT Treatment/Interventions  ADLs/Self Care Home Management;Aquatic Therapy;Cryotherapy;Electrical Stimulation;Iontophoresis 4mg /ml Dexamethasone;Moist Heat;Ultrasound;Gait training;Therapeutic activities;Therapeutic exercise;Neuromuscular re-education;Patient/family education;Manual techniques;Passive range of motion;Dry needling;Energy conservation;Joint Manipulations;Taping    PT Next Visit Plan  look at [facility] and needs MD note, continue as protocol allows    PT Home Exercise Plan  access code: [email or web address] Code: [email or web address]: [email or web address]: 04/29/2021Prepared by: Herbie Baltimore LovellExercisesStanding Scapular Retraction - 5 x daily - 7 x weekly - 1 sets  - 5 reps - 5 holdSupine Bilateral Shoulder Protraction - 2 x daily - 7 x weekly - 2 sets - 10 reps - 3 second  hold    Consulted and Agree with Plan of Care  Patient       Patient will benefit from skilled therapeutic intervention in order to improve the following deficits and impairments:  Decreased activity tolerance, Decreased endurance, Decreased mobility, Decreased range of motion, Decreased strength, Impaired flexibility, Increased muscle spasms, Obesity, Postural dysfunction, Pain, Increased fascial restricitons  Visit Diagnosis: Chronic right shoulder pain  Stiffness of right shoulder, not elsewhere classified  Muscle weakness (generalized)  Localized edema     Problem List Patient Active Problem List   Diagnosis Date Noted  . 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 Osteoarthritis of left knee 02/16/2015  . OBESITY 10/25/2007  . HIATAL HERNIA 10/25/2007  . ABDOMINAL PAIN-EPIGASTRIC 10/25/2007      Laureen Abrahams, PT, DPT 09/01/19 3:12 PM     [facility] Physical Therapy [address] [place], [place], [address] Phone: [phone]   Fax:  [phone]  Name: [name] MRN: [id number] Date of Birth: 09/03/1945

## 2019-09-05 ENCOUNTER — Other Ambulatory Visit: Payer: Self-pay

## 2019-09-05 ENCOUNTER — Ambulatory Visit: Payer: Medicare PPO | Admitting: Physical Therapy

## 2019-09-05 DIAGNOSIS — R600 Localized edema: Secondary | ICD-10-CM | POA: Diagnosis not present

## 2019-09-05 DIAGNOSIS — G8929 Other chronic pain: Secondary | ICD-10-CM

## 2019-09-05 DIAGNOSIS — M25611 Stiffness of right shoulder, not elsewhere classified: Secondary | ICD-10-CM

## 2019-09-05 DIAGNOSIS — M25511 Pain in right shoulder: Secondary | ICD-10-CM

## 2019-09-05 DIAGNOSIS — M6281 Muscle weakness (generalized): Secondary | ICD-10-CM | POA: Diagnosis not present

## 2019-09-05 NOTE — Therapy (Signed)
[facility] [address] [place], [place], [address] Phone: [phone]   Fax:  [phone]  Physical Therapy Treatment/Progress note Progress Note reporting period 08/08/19 to 09/05/19  See below for objective and subjective measurements relating to patients progress with PT.   Patient Details  Name: [name] MRN: [id number] Date of Birth: [birth year] Referring Provider (PT): Marlou Sa Tonna Corner, MD   Encounter Date: 09/05/2019  PT End of Session - 09/05/19 1024    Visit Number  9    Number of Visits  12    Date for PT Re-Evaluation  10/31/19    Authorization Type  [employer] MCR    Authorization - Visit Number  9    Authorization - Number of Visits  12    Progress Note Due on Visit  19    PT Start Time  0930    PT Stop Time  1018    PT Time Calculation (min)  48 min    Activity Tolerance  Patient tolerated treatment well    Behavior During Therapy  [facility] for tasks assessed/performed       Past Medical History:  Diagnosis Date  . Anxiety   . Arthritis    osteoarthritis left knee, right shoulder "locks" occ.  . Diabetes mellitus without complication ([place])   . H/O seasonal allergies   . History of hiatal hernia   . Hypertension   . Pneumonia    as a child  . Sleep apnea    no cpap-continues to be evaluated    Past Surgical History:  Procedure Laterality Date  . ABDOMINAL HYSTERECTOMY    . BACK SURGERY     lumbar fusion  . CATARACT EXTRACTION, BILATERAL    . CHOLECYSTECTOMY    . COLONOSCOPY WITH PROPOFOL N/A 07/30/2017   Procedure: COLONOSCOPY WITH PROPOFOL;  Surgeon: Juanita Craver, MD;  Location: [facility];  Service: Endoscopy;  Laterality: N/A;  . EXPLORATORY LAPAROTOMY     ovaries freed from intestines.  Marland Kitchen FOOT SURGERY     retained hardware.  Marland Kitchen KNEE ARTHROSCOPY Right    scope surgery  . TONSILLECTOMY    . TOTAL KNEE ARTHROPLASTY Left 02/16/2015   Procedure: LEFT TOTAL KNEE ARTHROPLASTY;  Surgeon: Sydnee Cabal, MD;   Location: [facility];  Service: Orthopedics;  Laterality: Left;  . TOTAL SHOULDER ARTHROPLASTY Right 07/26/2019   Procedure: RIGHT TOTAL SHOULDER ARTHROPLASTY;  Surgeon: Meredith Pel, MD;  Location: [place];  Service: Orthopedics;  Laterality: Right;  . WRIST SURGERY Left    x2 "cysts"    There were no vitals filed for this visit.  Subjective Assessment - 09/05/19 0956    Subjective  relays her Rt shoulder is coming along, not much pain unless she pushes it to much into ER    Pertinent History  PMH: Rt TSA 07/26/19, DM, sleep apnea, lumbar fusion 13 years ago, Lt TKA 4 years ago    Limitations  Lifting;Standing;Walking;House hold activities    Patient Stated Goals  reduce pain, regain function in Rt shoulder    Pain Onset  More than a month ago         [facility] PT Assessment - 09/05/19 0001      Assessment   Medical Diagnosis  S/P Rt TSA 07/26/19    Referring Provider (PT)  Meredith Pel, MD    Onset Date/Surgical Date  07/26/19    Hand Dominance  Right      PROM   Right Shoulder Flexion  165 Degrees  Right Shoulder External Rotation  70 Degrees      Strength   Overall Strength Comments  Rt shoulder flexion and abduction 4/5 MMT         [facility] Adult PT Treatment/Exercise - 09/05/19 0001      Shoulder Exercises: Supine   External Rotation  AAROM;20 reps    External Rotation Weight (lbs)  3    Flexion  AAROM;20 reps    Shoulder Flexion Weight (lbs)  3      Shoulder Exercises: Seated   Theraband Level (Shoulder Row)  --    Row Limitations  --      Shoulder Exercises: Standing   Extension  Both    Theraband Level (Shoulder Extension)  Level 3 (Green)    Extension Limitations  30 reps    Row  Both    Theraband Level (Shoulder Row)  Level 3 (Green)    Row Limitations  30 reps    Other Standing Exercises  UE Ranger circles both directions and flexion X 10 ea    Other Standing Exercises  wall ladder x 10 reps flexion and scaption      Shoulder Exercises: Pulleys    Flexion  2 minutes    Scaption  2 minutes      Shoulder Exercises: ROM/Strengthening   UBE (Upper Arm Bike)  2 min fwd, 2 min retro L1      Vasopneumatic   Number Minutes Vasopneumatic   10 minutes    Vasopnuematic Location   Shoulder    Vasopneumatic Pressure  Medium    Vasopneumatic Temperature   34      Manual Therapy   Manual therapy comments  Rt shoulder PROM gentle all planes to tolerance x 8 minutes               PT Short Term Goals - 09/05/19 1025      PT SHORT TERM GOAL #1   Title  Pt will be I and compliant with initial HEP.    Period  Weeks    Status  Achieved      PT SHORT TERM GOAL #2   Title  Pt will improve Rt shoulder PROM to [facility] as protocol allows.    Status  Achieved        PT Long Term Goals - 09/05/19 1025      PT LONG TERM GOAL #1   Title  Pt will be I and compliant with HEP progresion. (Target for all goals 12 weeks 10/31/19))    Time  12    Status  On-going      PT LONG TERM GOAL #2   Title  Pt will improve Rt shoulder AROM to [facility] (at least 140 deg flexion and abduction)    Status  On-going      PT LONG TERM GOAL #3   Title  Pt will improve Rt shoulder strength to at least 4+/5 to improve function.    Baseline  now 4/5    Status  On-going      PT LONG TERM GOAL #4   Title  Pt will reduce overall pain less than 3/10 with ususal activity.    Status  Achieved            Plan - 09/05/19 1027    Clinical Impression Statement  She is overall doing well with PT, has made great progress with ROM but still some limitations with ER. She has met her short term goals and is progressing toward  long term goals. She will be 6 weeks post op tommorow and will follow up with MD Wednesday. If MD allows, PT will plan to add RTC strenghtening next visit.    Personal Factors and Comorbidities  Comorbidity 2;Time since onset of injury/illness/exacerbation;Fitness    Comorbidities  PMH: Rt TSA 07/26/19, DM, sleep apnea, lumbar fusion 13 years ago,  Lt TKA 4 years ago    Rehab Potential  Good    PT Frequency  2x / week    PT Duration  12 weeks    PT Treatment/Interventions  ADLs/Self Care Home Management;Aquatic Therapy;Cryotherapy;Electrical Stimulation;Iontophoresis 30m/ml Dexamethasone;Moist Heat;Ultrasound;Gait training;Therapeutic activities;Therapeutic exercise;Neuromuscular re-education;Patient/family education;Manual techniques;Passive range of motion;Dry needling;Energy conservation;Joint Manipulations;Taping    PT Next Visit Plan  what did MD say, add RTC strength if MD allows as now 6 weeks post op    PT Home Exercise Plan  access code: [email or web address]: 04/29/2021Prepared by: RHerbie BaltimoreLovellExercisesStanding Scapular Retraction - 5 x daily - 7 x weekly - 1 sets - 5 reps - 5 holdSupine Bilateral Shoulder Protraction - 2 x daily - 7 x weekly - 2 sets - 10 reps - 3 second hold       Patient will benefit from skilled therapeutic intervention in order to improve the following deficits and impairments:  Decreased activity tolerance, Decreased endurance, Decreased mobility, Decreased range of motion, Decreased strength, Impaired flexibility, Increased muscle spasms, Obesity, Postural dysfunction, Pain, Increased fascial restricitons  Visit Diagnosis: Chronic right shoulder pain  Stiffness of right shoulder, not elsewhere classified  Muscle weakness (generalized)  Localized edema     Problem List Patient Active Problem List   Diagnosis Date Noted  . 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 Osteoarthritis of left knee 02/16/2015  . OBESITY 10/25/2007  . HIATAL HERNIA 10/25/2007  . ABDOMINAL PAIN-EPIGASTRIC 10/25/2007    BSilvestre Mesi5/24/2021, 10:36 AM  [facility] [address] [place] [address]Phone: [phone]  Fax:  [phone] Name: BKINSEY KARCHMRN:  [id number] of Birth: [birth year]

## 2019-09-07 ENCOUNTER — Ambulatory Visit: Payer: Medicare PPO | Admitting: Orthopedic Surgery

## 2019-09-07 ENCOUNTER — Other Ambulatory Visit: Payer: Self-pay

## 2019-09-07 DIAGNOSIS — Z96611 Presence of right artificial shoulder joint: Secondary | ICD-10-CM

## 2019-09-08 ENCOUNTER — Ambulatory Visit (INDEPENDENT_AMBULATORY_CARE_PROVIDER_SITE_OTHER): Payer: Medicare PPO | Admitting: Physical Therapy

## 2019-09-08 ENCOUNTER — Encounter: Payer: Self-pay | Admitting: Physical Therapy

## 2019-09-08 DIAGNOSIS — M6281 Muscle weakness (generalized): Secondary | ICD-10-CM | POA: Diagnosis not present

## 2019-09-08 DIAGNOSIS — R600 Localized edema: Secondary | ICD-10-CM

## 2019-09-08 DIAGNOSIS — M25511 Pain in right shoulder: Secondary | ICD-10-CM | POA: Diagnosis not present

## 2019-09-08 DIAGNOSIS — M25611 Stiffness of right shoulder, not elsewhere classified: Secondary | ICD-10-CM | POA: Diagnosis not present

## 2019-09-08 DIAGNOSIS — G8929 Other chronic pain: Secondary | ICD-10-CM

## 2019-09-08 NOTE — Therapy (Signed)
[facility] [address] [place], [place], [address] Phone: [phone]   Fax:  [phone]  Physical Therapy Treatment  Patient Details  Name: [name] MRN: [id number] Date of Birth: 10/09/1945 Referring Provider (PT): Marlou Sa Tonna Corner, MD   Encounter Date: 09/08/2019  PT End of Session - 09/08/19 1109    Visit Number  10    Number of Visits  12    Date for PT Re-Evaluation  10/31/19    Authorization Type  [employer] MCR    Authorization - Visit Number  10    Authorization - Number of Visits  12    Progress Note Due on Visit  19    PT Start Time  0930    PT Stop Time  1020    PT Time Calculation (min)  50 min    Activity Tolerance  Patient tolerated treatment well    Behavior During Therapy  [facility] for tasks assessed/performed       Past Medical History:  Diagnosis Date  . Anxiety   . Arthritis    osteoarthritis left knee, right shoulder "locks" occ.  . Diabetes mellitus without complication ([place])   . H/O seasonal allergies   . History of hiatal hernia   . Hypertension   . Pneumonia    as a child  . Sleep apnea    no cpap-continues to be evaluated    Past Surgical History:  Procedure Laterality Date  . ABDOMINAL HYSTERECTOMY    . BACK SURGERY     lumbar fusion  . CATARACT EXTRACTION, BILATERAL    . CHOLECYSTECTOMY    . COLONOSCOPY WITH PROPOFOL N/A 07/30/2017   Procedure: COLONOSCOPY WITH PROPOFOL;  Surgeon: Juanita Craver, MD;  [facility];  Service: Endoscopy;  Laterality: N/A;  . EXPLORATORY LAPAROTOMY     ovaries freed from intestines.  Marland Kitchen FOOT SURGERY     retained hardware.  Marland Kitchen KNEE ARTHROSCOPY Right    scope surgery  . TONSILLECTOMY    . TOTAL KNEE ARTHROPLASTY Left 02/16/2015   Procedure: LEFT TOTAL KNEE ARTHROPLASTY;  Surgeon: Sydnee Cabal, MD;  [facility];  Service: Orthopedics;  Laterality: Left;  . TOTAL SHOULDER ARTHROPLASTY Right 07/26/2019   Procedure: RIGHT TOTAL SHOULDER ARTHROPLASTY;   Surgeon: Meredith Pel, MD;  Location: [place];  Service: Orthopedics;  Laterality: Right;  . WRIST SURGERY Left    x2 "cysts"    There were no vitals filed for this visit.  Subjective Assessment - 09/08/19 1024    Subjective  relays MD was pleased. Her shoulder is sore but does not want to back down any today.    Pertinent History  PMH: Rt TSA 07/26/19, DM, sleep apnea, lumbar fusion 13 years ago, Lt TKA 4 years ago    Limitations  Lifting;Standing;Walking;House hold activities    Patient Stated Goals  reduce pain, regain function in Rt shoulder    Currently in Pain?  Yes    Pain Score  5     Pain Location  Shoulder    Pain Orientation  Right    Pain Onset  More than a month ago         [facility] Adult PT Treatment/Exercise - 09/08/19 0001      Shoulder Exercises: Standing   External Rotation  Right;20 reps    Theraband Level (Shoulder External Rotation)  Level 3 (Green)    Internal Rotation  20 reps;Right    Theraband Level (Shoulder Internal Rotation)  Level 3 (Green)  Extension  Both    Theraband Level (Shoulder Extension)  Level 3 (Green)    Extension Limitations  30 reps    Row  Both    Theraband Level (Shoulder Row)  Level 3 (Green)    Row Limitations  30 reps    Other Standing Exercises  UE Ranger circles both directions and flexion X 10 ea    Other Standing Exercises  wall ladder x 5 reps flexion and scaption      Shoulder Exercises: Pulleys   Flexion  2 minutes    Scaption  2 minutes      Shoulder Exercises: ROM/Strengthening   UBE (Upper Arm Bike)  3 min fwd, 3 min retro L1      Vasopneumatic   Number Minutes Vasopneumatic   10 minutes    Vasopnuematic Location   Shoulder    Vasopneumatic Pressure  Medium    Vasopneumatic Temperature   34        PT Short Term Goals - 09/05/19 1025      PT SHORT TERM GOAL #1   Title  Pt will be I and compliant with initial HEP.    Period  Weeks    Status  Achieved      PT SHORT TERM GOAL #2   Title  Pt will  improve Rt shoulder PROM to [facility] as protocol allows.    Status  Achieved        PT Long Term Goals - 09/05/19 1025      PT LONG TERM GOAL #1   Title  Pt will be I and compliant with HEP progresion. (Target for all goals 12 weeks 10/31/19))    Time  12    Status  On-going      PT LONG TERM GOAL #2   Title  Pt will improve Rt shoulder AROM to [facility] (at least 140 deg flexion and abduction)    Status  On-going      PT LONG TERM GOAL #3   Title  Pt will improve Rt shoulder strength to at least 4+/5 to improve function.    Baseline  now 4/5    Status  On-going      PT LONG TERM GOAL #4   Title  Pt will reduce overall pain less than 3/10 with ususal activity.    Status  Achieved            Plan - 09/08/19 1110    Clinical Impression Statement  Continued with ROM and strength progression, she is 6 weeks out so added in RTC strength to tolerance without complaints. Continue POC    Personal Factors and Comorbidities  Comorbidity 2;Time since onset of injury/illness/exacerbation;Fitness    Comorbidities  PMH: Rt TSA 07/26/19, DM, sleep apnea, lumbar fusion 13 years ago, Lt TKA 4 years ago    Rehab Potential  Good    PT Frequency  2x / week    PT Duration  12 weeks    PT Treatment/Interventions  ADLs/Self Care Home Management;Aquatic Therapy;Cryotherapy;Electrical Stimulation;Iontophoresis 4mg /ml Dexamethasone;Moist Heat;Ultrasound;Gait training;Therapeutic activities;Therapeutic exercise;Neuromuscular re-education;Patient/family education;Manual techniques;Passive range of motion;Dry needling;Energy conservation;Joint Manipulations;Taping    PT Next Visit Plan  what did MD say, add RTC strength if MD allows as now 6 weeks post op    PT Home Exercise Plan  access code: [email or web address] Code: [email or web address]: [email or web address]: 04/29/2021Prepared by: Herbie Baltimore LovellExercisesStanding Scapular Retraction - 5 x daily - 7 x weekly - 1 sets - 5 reps - 5 holdSupine Bilateral  Shoulder Protraction -  2 x daily - 7 x weekly - 2 sets - 10 reps - 3 second hold       Patient will benefit from skilled therapeutic intervention in order to improve the following deficits and impairments:  Decreased activity tolerance, Decreased endurance, Decreased mobility, Decreased range of motion, Decreased strength, Impaired flexibility, Increased muscle spasms, Obesity, Postural dysfunction, Pain, Increased fascial restricitons  Visit Diagnosis: Chronic right shoulder pain  Stiffness of right shoulder, not elsewhere classified  Muscle weakness (generalized)  Localized edema     Problem List Patient Active Problem List   Diagnosis Date Noted  . 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 Osteoarthritis of left knee 02/16/2015  . OBESITY 10/25/2007  . HIATAL HERNIA 10/25/2007  . ABDOMINAL PAIN-EPIGASTRIC 10/25/2007    Silvestre Mesi 09/08/2019, 11:12 AM  [facility] Therapy [address] [place], [place], [address] Phone: [phone]   Fax:  [phone]  Name: [name] MRN: [id number] Date of Birth: 07/13/1945

## 2019-09-12 ENCOUNTER — Encounter: Payer: Self-pay | Admitting: Orthopedic Surgery

## 2019-09-12 NOTE — Progress Notes (Signed)
Post-Op Visit Note   Patient: [name]           Date of Birth: 1945/11/14           MRN: [id number] Visit Date: 09/07/2019 PCP: Rose Phi, FNP   Assessment & Plan:  Chief Complaint:  Chief Complaint  Patient presents with  . Right Shoulder - Routine Post Op, Follow-up   Visit Diagnoses: No diagnosis found.  Plan: Patient is a 74 year old female presents s/p right reverse shoulder arthroplasty on 07/26/2019.  She is about 6 weeks postop.  She is doing very well overall.  She is doing physical therapy twice a week.  She takes oxycodone about once a day for pain control.  She has excellent range of motion with 115 degrees forward flexion, 90 degrees abduction, 30 degrees external rotation.  She has excellent strength of the subscapularis muscle.  She is able to bring her arm overhead and function very well.  Next pain medicine refill we will transition to [facility] and then tramadol to taper patient off.  Patient agreed with this plan.  We will start strengthening and physical therapy as well.  Follow-up in 6 weeks for clinical recheck, will likely be final check.  Patient agreed with plan.  Additionally, patient is trying to pursue total knee arthroplasty.  She has a goal weight of 270 pounds and today she weighed 283 pounds.  Follow-Up Instructions: No follow-ups on file.   Orders:  No orders of the defined types were placed in this encounter.  No orders of the defined types were placed in this encounter.   Imaging: No results found.  PMFS History: Patient Active Problem List   Diagnosis Date Noted  . 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 Osteoarthritis of left knee 02/16/2015  . OBESITY 10/25/2007  . HIATAL HERNIA 10/25/2007  . ABDOMINAL PAIN-EPIGASTRIC 10/25/2007   Past Medical History:  Diagnosis Date  . Anxiety   . Arthritis    osteoarthritis left knee, right shoulder "locks" occ.  . Diabetes mellitus without  complication ([place])   . H/O seasonal allergies   . History of hiatal hernia   . Hypertension   . Pneumonia    as a child  . Sleep apnea    no cpap-continues to be evaluated    No family history on file.  Past Surgical History:  Procedure Laterality Date  . ABDOMINAL HYSTERECTOMY    . BACK SURGERY     lumbar fusion  . CATARACT EXTRACTION, BILATERAL    . CHOLECYSTECTOMY    . COLONOSCOPY WITH PROPOFOL N/A 07/30/2017   Procedure: COLONOSCOPY WITH PROPOFOL;  Surgeon: Juanita Craver, MD;  [facility];  Service: Endoscopy;  Laterality: N/A;  . EXPLORATORY LAPAROTOMY     ovaries freed from intestines.  Marland Kitchen FOOT SURGERY     retained hardware.  Marland Kitchen KNEE ARTHROSCOPY Right    scope surgery  . TONSILLECTOMY    . TOTAL KNEE ARTHROPLASTY Left 02/16/2015   Procedure: LEFT TOTAL KNEE ARTHROPLASTY;  Surgeon: Sydnee Cabal, MD;  [facility];  Service: Orthopedics;  Laterality: Left;  . TOTAL SHOULDER ARTHROPLASTY Right 07/26/2019   Procedure: RIGHT TOTAL SHOULDER ARTHROPLASTY;  Surgeon: Meredith Pel, MD;  Location: [place];  Service: Orthopedics;  Laterality: Right;  . WRIST SURGERY Left    x2 "cysts"   Social History   Occupational History  . Not on file  Tobacco Use  . Smoking status: Former Smoker  Packs/day: 0.50    Years: 10.00    Pack years: 5.00    Types: Cigarettes    Quit date: 02/07/1973    Years since quitting: 46.6  . Smokeless tobacco: Never Used  Substance and Sexual Activity  . Alcohol use: Yes    Comment: rare occ.  . Drug use: No  . Sexual activity: Not on file

## 2019-09-22 ENCOUNTER — Other Ambulatory Visit: Payer: Self-pay

## 2019-09-22 ENCOUNTER — Encounter: Payer: Self-pay | Admitting: Physical Therapy

## 2019-09-22 ENCOUNTER — Ambulatory Visit (INDEPENDENT_AMBULATORY_CARE_PROVIDER_SITE_OTHER): Payer: Medicare PPO | Admitting: Physical Therapy

## 2019-09-22 DIAGNOSIS — R600 Localized edema: Secondary | ICD-10-CM | POA: Diagnosis not present

## 2019-09-22 DIAGNOSIS — G8929 Other chronic pain: Secondary | ICD-10-CM

## 2019-09-22 DIAGNOSIS — M6281 Muscle weakness (generalized): Secondary | ICD-10-CM | POA: Diagnosis not present

## 2019-09-22 DIAGNOSIS — M25611 Stiffness of right shoulder, not elsewhere classified: Secondary | ICD-10-CM

## 2019-09-22 DIAGNOSIS — M25511 Pain in right shoulder: Secondary | ICD-10-CM

## 2019-09-22 NOTE — Therapy (Signed)
[facility] Physical Therapy [address] [place], [place], [address] Phone: [phone]   Fax:  [phone]  Physical Therapy Treatment  Patient Details  Name: [name] MRN: [id number] Date of Birth: 02-Nov-1945 Referring Provider (PT): Marlou Sa Tonna Corner, MD   Encounter Date: 09/22/2019   PT End of Session - 09/22/19 1008    Visit Number 11    Number of Visits 12    Date for PT Re-Evaluation 10/31/19    Authorization Type [employer] MCR    Authorization - Visit Number 11    Authorization - Number of Visits 12    Progress Note Due on Visit 19    PT Start Time 0930    PT Stop Time 1008    PT Time Calculation (min) 38 min    Activity Tolerance Patient tolerated treatment well    Behavior During Therapy [facility] for tasks assessed/performed           Past Medical History:  Diagnosis Date  . Anxiety   . Arthritis    osteoarthritis left knee, right shoulder "locks" occ.  . Diabetes mellitus without complication ([place])   . H/O seasonal allergies   . History of hiatal hernia   . Hypertension   . Pneumonia    as a child  . Sleep apnea    no cpap-continues to be evaluated    Past Surgical History:  Procedure Laterality Date  . ABDOMINAL HYSTERECTOMY    . BACK SURGERY     lumbar fusion  . CATARACT EXTRACTION, BILATERAL    . CHOLECYSTECTOMY    . COLONOSCOPY WITH PROPOFOL N/A 07/30/2017   Procedure: COLONOSCOPY WITH PROPOFOL;  Surgeon: Juanita Craver, MD;  Location: [facility];  Service: Endoscopy;  Laterality: N/A;  . EXPLORATORY LAPAROTOMY     ovaries freed from intestines.  Marland Kitchen FOOT SURGERY     retained hardware.  Marland Kitchen KNEE ARTHROSCOPY Right    scope surgery  . TONSILLECTOMY    . TOTAL KNEE ARTHROPLASTY Left 02/16/2015   Procedure: LEFT TOTAL KNEE ARTHROPLASTY;  Surgeon: Sydnee Cabal, MD;  Location: [facility];  Service: Orthopedics;  Laterality: Left;  . TOTAL SHOULDER ARTHROPLASTY Right 07/26/2019   Procedure: RIGHT TOTAL SHOULDER ARTHROPLASTY;   Surgeon: Meredith Pel, MD;  Location: [place];  Service: Orthopedics;  Laterality: Right;  . WRIST SURGERY Left    x2 "cysts"    There were no vitals filed for this visit.   Subjective Assessment - 09/22/19 1007    Subjective brought a yellow band and wants to know how to use today.    Pertinent History PMH: Rt TSA 07/26/19, DM, sleep apnea, lumbar fusion 13 years ago, Lt TKA 4 years ago    Limitations Lifting;Standing;Walking;House hold activities    Patient Stated Goals reduce pain, regain function in Rt shoulder    Currently in Pain? No/denies    Pain Onset More than a month ago                             [facility] Adult PT Treatment/Exercise - 09/22/19 0930      Shoulder Exercises: Seated   Row Left;20 reps;Theraband    Theraband Level (Shoulder Row) Level 1 (Yellow)    Horizontal ABduction Right;20 reps;Theraband    Theraband Level (Shoulder Horizontal ABduction) Level 1 (Yellow)    External Rotation Right;20 reps;Theraband    Theraband Level (Shoulder External Rotation) Level 1 (Yellow)    Other Seated Exercises forward punch  2x10; L1 band, Rt      Shoulder Exercises: Standing   Other Standing Exercises wall ladder x 10 reps flexion and scaption      Shoulder Exercises: Pulleys   Flexion 2 minutes    Scaption 2 minutes      Manual Therapy   Manual Therapy Passive ROM    Passive ROM Rt shoulder all motions - focus on ER in varying degrees of abduction                    PT Short Term Goals - 09/05/19 1025      PT SHORT TERM GOAL #1   Title Pt will be I and compliant with initial HEP.    Period Weeks    Status Achieved      PT SHORT TERM GOAL #2   Title Pt will improve Rt shoulder PROM to [facility] as protocol allows.    Status Achieved             PT Long Term Goals - 09/05/19 1025      PT LONG TERM GOAL #1   Title Pt will be I and compliant with HEP progresion. (Target for all goals 12 weeks 10/31/19))    Time 12    Status  On-going      PT LONG TERM GOAL #2   Title Pt will improve Rt shoulder AROM to [facility] (at least 140 deg flexion and abduction)    Status On-going      PT LONG TERM GOAL #3   Title Pt will improve Rt shoulder strength to at least 4+/5 to improve function.    Baseline now 4/5    Status On-going      PT LONG TERM GOAL #4   Title Pt will reduce overall pain less than 3/10 with ususal activity.    Status Achieved                 Plan - 09/22/19 1009    Clinical Impression Statement Pt tolerated session well today with focus on strengthening and ROM.  Will continue to benefit from PT to maximize function.    Personal Factors and Comorbidities Comorbidity 2;Time since onset of injury/illness/exacerbation;Fitness    Comorbidities PMH: Rt TSA 07/26/19, DM, sleep apnea, lumbar fusion 13 years ago, Lt TKA 4 years ago    Rehab Potential Good    PT Frequency 2x / week    PT Duration 12 weeks    PT Treatment/Interventions ADLs/Self Care Home Management;Aquatic Therapy;Cryotherapy;Electrical Stimulation;Iontophoresis 4mg /ml Dexamethasone;Moist Heat;Ultrasound;Gait training;Therapeutic activities;Therapeutic exercise;Neuromuscular re-education;Patient/family education;Manual techniques;Passive range of motion;Dry needling;Energy conservation;Joint Manipulations;Taping    PT Next Visit Plan what did MD say, add RTC strength if MD allows as now 6 weeks post op; needs more appts after next visit    PT Home Exercise Plan access code: [email or web address] Code: [email or web address]: [email or web address]: 04/29/2021Prepared by: Herbie Baltimore LovellExercisesStanding Scapular Retraction - 5 x daily - 7 x weekly - 1 sets - 5 reps - 5 holdSupine Bilateral Shoulder Protraction - 2 x daily - 7 x weekly - 2 sets - 10 reps - 3 second hold           Patient will benefit from skilled therapeutic intervention in order to improve the following deficits and impairments:  Decreased activity tolerance, Decreased  endurance, Decreased mobility, Decreased range of motion, Decreased strength, Impaired flexibility, Increased muscle spasms, Obesity, Postural dysfunction, Pain, Increased fascial restricitons  Visit Diagnosis: Chronic right shoulder pain  Stiffness of right shoulder, not  elsewhere classified  Muscle weakness (generalized)  Localized edema     Problem List Patient Active Problem List   Diagnosis Date Noted  . 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 Osteoarthritis of left knee 02/16/2015  . OBESITY 10/25/2007  . HIATAL HERNIA 10/25/2007  . ABDOMINAL PAIN-EPIGASTRIC 10/25/2007      Laureen Abrahams, PT, DPT 09/22/19 10:11 AM    [facility] Physical Therapy [address] [place], [place], [address] Phone: [phone]   Fax:  [phone]  Name: [name] MRN: [id number] Date of Birth: 09/06/1945

## 2019-09-26 ENCOUNTER — Ambulatory Visit (INDEPENDENT_AMBULATORY_CARE_PROVIDER_SITE_OTHER): Payer: Medicare PPO | Admitting: Physical Therapy

## 2019-09-26 ENCOUNTER — Encounter: Payer: Self-pay | Admitting: Physical Therapy

## 2019-09-26 ENCOUNTER — Other Ambulatory Visit: Payer: Self-pay

## 2019-09-26 VITALS — BP 133/84 | HR 85

## 2019-09-26 DIAGNOSIS — M25511 Pain in right shoulder: Secondary | ICD-10-CM

## 2019-09-26 DIAGNOSIS — G8929 Other chronic pain: Secondary | ICD-10-CM

## 2019-09-26 DIAGNOSIS — M6281 Muscle weakness (generalized): Secondary | ICD-10-CM | POA: Diagnosis not present

## 2019-09-26 DIAGNOSIS — R600 Localized edema: Secondary | ICD-10-CM

## 2019-09-26 DIAGNOSIS — M25611 Stiffness of right shoulder, not elsewhere classified: Secondary | ICD-10-CM

## 2019-09-26 NOTE — Therapy (Signed)
[facility] Physical Therapy [address] [place], [place], [address] Phone: [phone]   Fax:  [phone]  Physical Therapy Treatment  Patient Details  Name: [name] MRN: [id number] Date of Birth: [birth year] Referring Provider (PT): Marlou Sa Tonna Corner, MD   Encounter Date: 09/26/2019   PT End of Session - 09/26/19 1021    Visit Number 12    Number of Visits 24    Date for PT Re-Evaluation 10/31/19    Authorization Type [employer] MCR    Authorization - Visit Number 12    Authorization - Number of Visits 12    Progress Note Due on Visit 19    PT Start Time 0934    PT Stop Time 1022    PT Time Calculation (min) 48 min    Activity Tolerance Patient tolerated treatment well    Behavior During Therapy [facility] for tasks assessed/performed           Past Medical History:  Diagnosis Date  . Anxiety   . Arthritis    osteoarthritis left knee, right shoulder "locks" occ.  . Diabetes mellitus without complication ([place])   . H/O seasonal allergies   . History of hiatal hernia   . Hypertension   . Pneumonia    as a child  . Sleep apnea    no cpap-continues to be evaluated    Past Surgical History:  Procedure Laterality Date  . ABDOMINAL HYSTERECTOMY    . BACK SURGERY     lumbar fusion  . CATARACT EXTRACTION, BILATERAL    . CHOLECYSTECTOMY    . COLONOSCOPY WITH PROPOFOL N/A 07/30/2017   Procedure: COLONOSCOPY WITH PROPOFOL;  Surgeon: Juanita Craver, MD;  [facility];  Service: Endoscopy;  Laterality: N/A;  . EXPLORATORY LAPAROTOMY     ovaries freed from intestines.  Marland Kitchen FOOT SURGERY     retained hardware.  Marland Kitchen KNEE ARTHROSCOPY Right    scope surgery  . TONSILLECTOMY    . TOTAL KNEE ARTHROPLASTY Left 02/16/2015   Procedure: LEFT TOTAL KNEE ARTHROPLASTY;  Surgeon: Sydnee Cabal, MD;  [facility];  Service: Orthopedics;  Laterality: Left;  . TOTAL SHOULDER ARTHROPLASTY Right 07/26/2019   Procedure: RIGHT TOTAL SHOULDER ARTHROPLASTY;   Surgeon: Meredith Pel, MD;  Location: [place];  Service: Orthopedics;  Laterality: Right;  . WRIST SURGERY Left    x2 "cysts"    Vitals:   09/26/19 0942  BP: 133/84  Pulse: 85  SpO2: 98%     Subjective Assessment - 09/26/19 0938    Subjective still having some nerve quick shooting pains, otherwise no issues    Pertinent History PMH: Rt TSA 07/26/19, DM, sleep apnea, lumbar fusion 13 years ago, Lt TKA 4 years ago    Limitations Lifting;Standing;Walking;House hold activities    Patient Stated Goals reduce pain, regain function in Rt shoulder    Currently in Pain? No/denies    Pain Onset More than a month ago              [facility] PT Assessment - 09/26/19 1006      Assessment   Medical Diagnosis S/P Rt TSA 07/26/19    Referring Provider (PT) Marlou Sa Tonna Corner, MD    Onset Date/Surgical Date 07/26/19    Hand Dominance Right      AROM   AROM Assessment Site Shoulder    Right/Left Shoulder Right    Right Shoulder Flexion 132 Degrees    Right Shoulder ABduction 143 Degrees    Right Shoulder Internal  Rotation --   Rt lateral hip   Right Shoulder External Rotation --   T1     Strength   Overall Strength Comments Rt shoulder flexion and abduction 4/5 MMT, IR 3+/5, ER 2/5                         [facility] Adult PT Treatment/Exercise - 09/26/19 0940      Shoulder Exercises: Standing   Flexion Right;Weights    Shoulder Flexion Weight (lbs) 1    Flexion Limitations 3x10    ABduction Right;Weights    Shoulder ABduction Weight (lbs) 1    ABduction Limitations 3x10; scaption    Extension Both    Theraband Level (Shoulder Extension) Level 3 (Green)    Extension Limitations 3x10    Row Both    Theraband Level (Shoulder Row) Level 3 (Green)    Row Limitations 3x10    Other Standing Exercises wall ladder x 10 reps flexion and scaption      Shoulder Exercises: Pulleys   Flexion 2 minutes    Scaption 2 minutes      Vasopneumatic   Number Minutes Vasopneumatic   10 minutes    Vasopnuematic Location  Shoulder    Vasopneumatic Pressure Medium    Vasopneumatic Temperature  34                    PT Short Term Goals - 09/05/19 1025      PT SHORT TERM GOAL #1   Title Pt will be I and compliant with initial HEP.    Period Weeks    Status Achieved      PT SHORT TERM GOAL #2   Title Pt will improve Rt shoulder PROM to [facility] as protocol allows.    Status Achieved             PT Long Term Goals - 09/26/19 1022      PT LONG TERM GOAL #1   Title Pt will be I and compliant with HEP progresion. (Target for all goals 12 weeks 10/31/19))    Baseline 6/14: continues to progress    Time 12    Status On-going      PT LONG TERM GOAL #2   Title Pt will improve Rt shoulder AROM to [facility] (at least 140 deg flexion and abduction)    Baseline 6/14: met abduction, improved not to goal with flexion    Status Partially Met      PT LONG TERM GOAL #3   Title Pt will improve Rt shoulder strength to at least 4+/5 to improve function.    Baseline 6/14: see flowsheet    Status On-going      PT LONG TERM GOAL #4   Title Pt will reduce overall pain less than 3/10 with ususal activity.    Status Achieved                 Plan - 09/26/19 1023    Clinical Impression Statement Pt demonstrating progress towards all LTGs meeting pain goal at this time.  Progressing well with PT at this time, and plan to see 2x/wk for up to 6 more weeks to progress rom and strength.    Personal Factors and Comorbidities Comorbidity 2;Time since onset of injury/illness/exacerbation;Fitness    Comorbidities PMH: Rt TSA 07/26/19, DM, sleep apnea, lumbar fusion 13 years ago, Lt TKA 4 years ago    Rehab Potential Good    PT Frequency 2x /  week    PT Duration 12 weeks    PT Treatment/Interventions ADLs/Self Care Home Management;Aquatic Therapy;Cryotherapy;Electrical Stimulation;Iontophoresis 33m/ml Dexamethasone;Moist Heat;Ultrasound;Gait training;Therapeutic  activities;Therapeutic exercise;Neuromuscular re-education;Patient/family education;Manual techniques;Passive range of motion;Dry needling;Energy conservation;Joint Manipulations;Taping    PT Next Visit Plan continue with strengthening, end range flexion    PT Home Exercise Plan access code: [email or web address]: [email or web address]: 04/29/2021Prepared by: RHerbie BaltimoreLovellExercisesStanding Scapular Retraction - 5 x daily - 7 x weekly - 1 sets - 5 reps - 5 holdSupine Bilateral Shoulder Protraction - 2 x daily - 7 x weekly - 2 sets - 10 reps - 3 second hold    Consulted and Agree with Plan of Care Patient           Patient will benefit from skilled therapeutic intervention in order to improve the following deficits and impairments:  Decreased activity tolerance, Decreased endurance, Decreased mobility, Decreased range of motion, Decreased strength, Impaired flexibility, Increased muscle spasms, Obesity, Postural dysfunction, Pain, Increased fascial restricitons  Visit Diagnosis: Chronic right shoulder pain  Stiffness of right shoulder, not elsewhere classified  Muscle weakness (generalized)  Localized edema     Problem List Patient Active Problem List   Diagnosis Date Noted  . 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 Osteoarthritis of left knee 02/16/2015  . OBESITY 10/25/2007  . HIATAL HERNIA 10/25/2007  . ABDOMINAL PAIN-EPIGASTRIC 10/25/2007       SLaureen Abrahams PT, DPT 09/26/19 10:31 AM    [facility] [address] [address]Phone: [phone]  Fax:  [phone] Name: Sue MCCOMBERMRN: [id number] of Birth: [birth year]

## 2019-09-30 ENCOUNTER — Ambulatory Visit (INDEPENDENT_AMBULATORY_CARE_PROVIDER_SITE_OTHER): Payer: Medicare PPO | Admitting: Physical Therapy

## 2019-09-30 ENCOUNTER — Other Ambulatory Visit: Payer: Self-pay

## 2019-09-30 DIAGNOSIS — M25611 Stiffness of right shoulder, not elsewhere classified: Secondary | ICD-10-CM

## 2019-09-30 DIAGNOSIS — M25511 Pain in right shoulder: Secondary | ICD-10-CM | POA: Diagnosis not present

## 2019-09-30 DIAGNOSIS — M6281 Muscle weakness (generalized): Secondary | ICD-10-CM | POA: Diagnosis not present

## 2019-09-30 DIAGNOSIS — R600 Localized edema: Secondary | ICD-10-CM | POA: Diagnosis not present

## 2019-09-30 DIAGNOSIS — G8929 Other chronic pain: Secondary | ICD-10-CM

## 2019-09-30 NOTE — Therapy (Signed)
[facility] Physical Therapy [address] [place], [place], [address] Phone: [phone]   Fax:  [phone]  Physical Therapy Treatment  Patient Details  Name: STORY CONTI MRN: [id number] Date of Birth: 1946/04/09 Referring Provider (PT): Marlou Sa, Tonna Corner, MD   Encounter Date: 09/30/2019   PT End of Session - 09/30/19 1140    Visit Number 13    Number of Visits 24    Date for PT Re-Evaluation 10/31/19    Authorization Type [employer] MCR, approaved 12 more until 7/23    Authorization - Visit Number 1    Authorization - Number of Visits 12    Progress Note Due on Visit 19    PT Start Time 1015    PT Stop Time 1100    PT Time Calculation (min) 45 min    Activity Tolerance Patient tolerated treatment well    Behavior During Therapy [facility] for tasks assessed/performed           Past Medical History:  Diagnosis Date   Anxiety    Arthritis    osteoarthritis left knee, right shoulder "locks" occ.   Diabetes mellitus without complication ([place])    H/O seasonal allergies    History of hiatal hernia    Hypertension    Pneumonia    as a child   Sleep apnea    no cpap-continues to be evaluated    Past Surgical History:  Procedure Laterality Date   ABDOMINAL HYSTERECTOMY     BACK SURGERY     lumbar fusion   CATARACT EXTRACTION, BILATERAL     CHOLECYSTECTOMY     COLONOSCOPY WITH PROPOFOL N/A 07/30/2017   Procedure: COLONOSCOPY WITH PROPOFOL;  Surgeon: Juanita Craver, MD;  [facility];  Service: Endoscopy;  Laterality: N/A;   EXPLORATORY LAPAROTOMY     ovaries freed from intestines.   FOOT SURGERY     retained hardware.   KNEE ARTHROSCOPY Right    scope surgery   TONSILLECTOMY     TOTAL KNEE ARTHROPLASTY Left 02/16/2015   Procedure: LEFT TOTAL KNEE ARTHROPLASTY;  Surgeon: Sydnee Cabal, MD;  [facility];  Service: Orthopedics;  Laterality: Left;   TOTAL SHOULDER ARTHROPLASTY Right 07/26/2019   Procedure: RIGHT TOTAL  SHOULDER ARTHROPLASTY;  Surgeon: Meredith Pel, MD;  Location: [place];  Service: Orthopedics;  Laterality: Right;   WRIST SURGERY Left    x2 "cysts"    There were no vitals filed for this visit.   Subjective Assessment - 09/30/19 1138    Subjective relays she wants do know more exercises to do at home. Relays her Rt shoulder is feeling good but having pain in her Lt shoulder and her knees    Pertinent History PMH: Rt TSA 07/26/19, DM, sleep apnea, lumbar fusion 13 years ago, Lt TKA 4 years ago    Limitations Lifting;Standing;Walking;House hold activities    Patient Stated Goals reduce pain, regain function in Rt shoulder    Pain Onset More than a month ago             Therex performed today:  Radio broadcast assistant against wall  1 set - 10 reps - 10 hold   Standing Shoulder Internal Rotation Stretch with Towel - 5 reps holding 10 sec  Doorway Pec Stretch at 90 Degrees Abduction  - 3 reps - 30 hold   Standing Shoulder Flexion with Resistance  2 sets - 10 reps   Standing Single Arm Shoulder Abduction with Resistance -  - 2 sets - 10  reps   Standing Shoulder Single Arm PNF D2 Flexion with Resistance - 10 reps   Standing Chest Press with Resistance Band  - 2 sets - 10 reps   Standing Row with Anchored Resistance - 20 reps   Shoulder extension with resistance - 20 reps Shoulder External Rotation with Anchored Resistance 20 reps     PT Education - 09/30/19 1139    Education Details HEP progresion and more IR stretching    Person(s) Educated Patient    Methods Explanation;Demonstration;Verbal cues;Handout    Comprehension Verbalized understanding;Returned demonstration            PT Short Term Goals - 09/05/19 1025      PT SHORT TERM GOAL #1   Title Pt will be I and compliant with initial HEP.    Period Weeks    Status Achieved      PT SHORT TERM GOAL #2   Title Pt will improve Rt shoulder PROM to [facility] as protocol allows.    Status Achieved             PT  Long Term Goals - 09/26/19 1022      PT LONG TERM GOAL #1   Title Pt will be I and compliant with HEP progresion. (Target for all goals 12 weeks 10/31/19))    Baseline 6/14: continues to progress    Time 12    Status On-going      PT LONG TERM GOAL #2   Title Pt will improve Rt shoulder AROM to [facility] (at least 140 deg flexion and abduction)    Baseline 6/14: met abduction, improved not to goal with flexion    Status Partially Met      PT LONG TERM GOAL #3   Title Pt will improve Rt shoulder strength to at least 4+/5 to improve function.    Baseline 6/14: see flowsheet    Status On-going      PT LONG TERM GOAL #4   Title Pt will reduce overall pain less than 3/10 with ususal activity.    Status Achieved                 Plan - 09/30/19 1141    Clinical Impression Statement Session foucsed on HEP progression and revision which was reviewed with her and she showed good understanding of this. She was provided with more resistance bands to take home to augment her strength program with PT.    Personal Factors and Comorbidities Comorbidity 2;Time since onset of injury/illness/exacerbation;Fitness    Comorbidities PMH: Rt TSA 07/26/19, DM, sleep apnea, lumbar fusion 13 years ago, Lt TKA 4 years ago    Examination-Activity Limitations Bend;Carry;Squat;Stairs;Sleep;Locomotion Level;Lift;Stand;Transfers    Stability/Clinical Decision Making Evolving/Moderate complexity    Rehab Potential Good    PT Frequency 2x / week    PT Duration 12 weeks    PT Treatment/Interventions ADLs/Self Care Home Management;Aquatic Therapy;Cryotherapy;Electrical Stimulation;Iontophoresis 74m/ml Dexamethasone;Moist Heat;Ultrasound;Gait training;Therapeutic activities;Therapeutic exercise;Neuromuscular re-education;Patient/family education;Manual techniques;Passive range of motion;Dry needling;Energy conservation;Joint Manipulations;Taping    PT Next Visit Plan continue with strengthening, end range flexion    PT  Home Exercise Plan Access Code: [email or web address]   Consulted and Agree with Plan of Care Patient           Patient will benefit from skilled therapeutic intervention in order to improve the following deficits and impairments:  Decreased activity tolerance, Decreased endurance, Decreased mobility, Decreased range of motion, Decreased strength, Impaired flexibility, Increased muscle spasms, Obesity, Postural dysfunction, Pain, Increased fascial  restricitons  Visit Diagnosis: Chronic right shoulder pain  Stiffness of right shoulder, not elsewhere classified  Muscle weakness (generalized)  Localized edema     Problem List Patient Active Problem List   Diagnosis Date Noted   S/P shoulder replacement, 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 [place], [place], [address] Phone: [phone]   Fax:  [phone]  Name: MARIA COIN MRN: [id number] Date of Birth: February 28, 1946

## 2019-09-30 NOTE — Patient Instructions (Signed)
Access Code: [email or web address]: [email or web address]: 06/18/2021Prepared by: [name] NelsonExercises  Sleeper Stretch - 2 x daily - 6 x weekly - 1 sets - 10 reps - 10 hold  Standing Shoulder Internal Rotation Stretch with Towel - 2 x daily - 6 x weekly - 10 reps - 1 sets - 10 sec hold  Doorway Pec Stretch at 90 Degrees Abduction - 2 x daily - 3-4 x weekly - 3 reps - 1 sets - 30 hold  Standing Shoulder Flexion with Resistance - 2 x daily - 6 x weekly - 2-3 sets - 10 reps  Standing Single Arm Shoulder Abduction with Resistance - 2 x daily - 6 x weekly - 2-3 sets - 10 reps  Standing Shoulder Single Arm PNF D2 Flexion with Resistance - 2 x daily - 6 x weekly - 2-3 sets - 10 reps  Standing Chest Press with Resistance Band with PLB - 2 x daily - 6 x weekly - 3 sets - 10 reps  Standing Row with Anchored Resistance - 2 x daily - 6 x weekly - 10-20 reps - 2-3 sets  Shoulder extension with resistance - Neutral - 2 x daily - 6 x weekly - 10 reps - 2-3 sets  Shoulder External Rotation with Anchored Resistance - 2 x daily - 6 x weekly - 10 reps - 2-3 sets

## 2019-10-04 ENCOUNTER — Ambulatory Visit (INDEPENDENT_AMBULATORY_CARE_PROVIDER_SITE_OTHER): Payer: Medicare PPO | Admitting: Physical Therapy

## 2019-10-04 ENCOUNTER — Other Ambulatory Visit: Payer: Self-pay

## 2019-10-04 DIAGNOSIS — G8929 Other chronic pain: Secondary | ICD-10-CM

## 2019-10-04 DIAGNOSIS — M25511 Pain in right shoulder: Secondary | ICD-10-CM

## 2019-10-04 DIAGNOSIS — M25611 Stiffness of right shoulder, not elsewhere classified: Secondary | ICD-10-CM

## 2019-10-04 DIAGNOSIS — M6281 Muscle weakness (generalized): Secondary | ICD-10-CM | POA: Diagnosis not present

## 2019-10-04 DIAGNOSIS — R600 Localized edema: Secondary | ICD-10-CM | POA: Diagnosis not present

## 2019-10-04 NOTE — Therapy (Addendum)
[facility] [address] [place], [place], [address] Phone: [phone]   Fax:  [phone]  Physical Therapy Treatment/MD progress note/Discharge Summary Progress Note reporting period 09/06/19 to 10/04/19  See below for objective and subjective measurements relating to patients progress with PT.   Patient Details  Name: [name] MRN: [id number] Date of Birth: 1945-11-01 Referring Provider (PT): Marlou Sa Tonna Corner, MD   Encounter Date: 10/04/2019   PT End of Session - 10/04/19 1222    Visit Number 14    Number of Visits 24    Date for PT Re-Evaluation 10/31/19    Authorization Type [employer] M[employer], approaved 12 more until 7/23    Authorization - Visit Number 3    Authorization - Number of Visits 12    Progress Note Due on Visit 24    PT Start Time 3790    PT Stop Time 1230    PT Time Calculation (min) 49 min    Activity Tolerance Patient tolerated treatment well    Behavior During Therapy [facility] for tasks assessed/performed           Past Medical History:  Diagnosis Date  . Anxiety   . Arthritis    osteoarthritis left knee, right shoulder "locks" occ.  . Diabetes mellitus without complication ([place])   . H/O seasonal allergies   . History of hiatal hernia   . Hypertension   . Pneumonia    as a child  . Sleep apnea    no cpap-continues to be evaluated    Past Surgical History:  Procedure Laterality Date  . ABDOMINAL HYSTERECTOMY    . BACK SURGERY     lumbar fusion  . CATARACT EXTRACTION, BILATERAL    . CHOLECYSTECTOMY    . COLONOSCOPY WITH PROPOFOL N/A 07/30/2017   Procedure: COLONOSCOPY WITH PROPOFOL;  Surgeon: Juanita Craver, MD;  [facility];  Service: Endoscopy;  Laterality: N/A;  . EXPLORATORY LAPAROTOMY     ovaries freed from intestines.  Marland Kitchen FOOT SURGERY     retained hardware.  Marland Kitchen KNEE ARTHROSCOPY Right    scope surgery  . TONSILLECTOMY    . TOTAL KNEE ARTHROPLASTY Left 02/16/2015   Procedure: LEFT TOTAL  KNEE ARTHROPLASTY;  Surgeon: Sydnee Cabal, MD;  [facility]: [facility];  Service: Orthopedics;  Laterality: Left;  . TOTAL SHOULDER ARTHROPLASTY Right 07/26/2019   Procedure: RIGHT TOTAL SHOULDER ARTHROPLASTY;  Surgeon: Meredith Pel, MD;  Location: [facility];  Service: Orthopedics;  Laterality: Right;  . WRIST SURGERY Left    x2 "cysts"    There were no vitals filed for this visit.   Subjective Assessment - 10/04/19 1221    Subjective relays she has been very stressed so more overall joint pain today.    Pertinent History PMH: Rt TSA 07/26/19, DM, sleep apnea, lumbar fusion 13 years ago, Lt TKA 4 years ago    Limitations Lifting;Standing;Walking;House hold activities    Patient Stated Goals reduce pain, regain function in Rt shoulder    Pain Onset More than a month ago              [facility] PT Assessment - 10/04/19 0001      Assessment   Medical Diagnosis S/P Rt TSA 07/26/19    Referring Provider (PT) Meredith Pel, MD    Onset Date/Surgical Date 07/26/19    Next MD Visit 6/24      AROM   Right Shoulder Flexion 140 Degrees    Right Shoulder ABduction 140 Degrees  Right Shoulder Internal Rotation --   Lt lateral hip above iliac crest behind her back   Right Shoulder External Rotation --   C7-T1 behind her head     Strength   Overall Strength Comments Rt shoulder flexion and abduction 4+/5 MMT, IR 4++/5, ER 4/5                         [facility] Adult PT Treatment/Exercise - 10/04/19 0001      Shoulder Exercises: Standing   External Rotation Right;20 reps    Theraband Level (Shoulder External Rotation) Level 3 (Green)    Internal Rotation 20 reps;Right    Theraband Level (Shoulder Internal Rotation) Level 3 (Green)    Flexion Both;20 reps    Shoulder Flexion Weight (lbs) 2    ABduction Both;20 reps    Shoulder ABduction Weight (lbs) 2    Extension Both;20 reps    Theraband Level (Shoulder Extension) Level 3 (Green)    Row Both;20 reps    Theraband Level  (Shoulder Row) Level 3 (Green)      Shoulder Exercises: Pulleys   Flexion 2 minutes    Scaption 2 minutes      Shoulder Exercises: ROM/Strengthening   UBE (Upper Arm Bike) 3 min fwd, 3 min retro L3      Shoulder Exercises: Stretch   Corner Stretch Limitations doorway 20 sec X 3    Internal Rotation Stretch Limitations 5 sec X 15 with strap behind back      Vasopneumatic   Number Minutes Vasopneumatic  10 minutes    Vasopnuematic Location  Shoulder    Vasopneumatic Pressure Medium    Vasopneumatic Temperature  34                    PT Short Term Goals - 09/05/19 1025      PT SHORT TERM GOAL #1   Title Pt will be I and compliant with initial HEP.    Period Weeks    Status Achieved      PT SHORT TERM GOAL #2   Title Pt will improve Rt shoulder PROM to [facility] as protocol allows.    Status Achieved             PT Long Term Goals - 10/04/19 1227      PT LONG TERM GOAL #1   Title Pt will be I and compliant with HEP progresion. (Target for all goals 12 weeks 10/31/19))    Baseline compliant with progression    Time 12    Status Achieved      PT LONG TERM GOAL #2   Title Pt will improve Rt shoulder AROM to [facility] (at least 140 deg flexion and abduction)    Baseline met 6/22    Status Achieved      PT LONG TERM GOAL #3   Title Pt will improve Rt shoulder strength to at least 4+/5 to improve function.    Baseline met 6/22    Status Achieved      PT LONG TERM GOAL #4   Title Pt will reduce overall pain less than 3/10 with ususal activity.    Baseline mostly met but still can get about 3 at times    Status On-going                 Plan - 10/04/19 1225    Clinical Impression Statement She is overall doing well with her Rt shoulder post op  and has made great progress with PT. Only with mild deficits in ROM and strength and can likely continue to progress this with HEP. She will see MD on 10/06/19 so if he releases her and is pleased then we will discharge from  PT. If he wants her to continue to then we will continue for 2 more weeks then discharge.    Personal Factors and Comorbidities Comorbidity 2;Time since onset of injury/illness/exacerbation;Fitness    Comorbidities PMH: Rt TSA 07/26/19, DM, sleep apnea, lumbar fusion 13 years ago, Lt TKA 4 years ago    Examination-Activity Limitations Bend;Carry;Squat;Stairs;Sleep;Locomotion Level;Lift;Stand;Transfers    Stability/Clinical Decision Making Evolving/Moderate complexity    Rehab Potential Good    PT Frequency 2x / week    PT Duration 12 weeks    PT Treatment/Interventions ADLs/Self Care Home Management;Aquatic Therapy;Cryotherapy;Electrical Stimulation;Iontophoresis 38m/ml Dexamethasone;Moist Heat;Ultrasound;Gait training;Therapeutic activities;Therapeutic exercise;Neuromuscular re-education;Patient/family education;Manual techniques;Passive range of motion;Dry needling;Energy conservation;Joint Manipulations;Taping    PT Next Visit Plan continue with strengthening, end range flexion    PT Home Exercise Plan Access Code: [email or web address]   Consulted and Agree with Plan of Care Patient           Patient will benefit from skilled therapeutic intervention in order to improve the following deficits and impairments:  Decreased activity tolerance, Decreased endurance, Decreased mobility, Decreased range of motion, Decreased strength, Impaired flexibility, Increased muscle spasms, Obesity, Postural dysfunction, Pain, Increased fascial restricitons  Visit Diagnosis: Chronic right shoulder pain  Stiffness of right shoulder, not elsewhere classified  Muscle weakness (generalized)  Localized edema     Problem List Patient Active Problem List   Diagnosis Date Noted  . 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 Osteoarthritis of left knee 02/16/2015  . OBESITY 10/25/2007  . HIATAL HERNIA 10/25/2007  . ABDOMINAL PAIN-EPIGASTRIC 10/25/2007    BSilvestre Mesi6/22/2021, 12:38 PM  [facility] [address] [place] [address]Phone: [phone]  Fax:  [phone] Name: Sue SIMIENMRN: [id number] of Birth: [birth year]    PHYSICAL THERAPY DISCHARGE SUMMARY  Visits from Start of Care: 14  Current functional level related to goals / functional outcomes: See above   Remaining deficits: See above   Education / Equipment: HEP  Plan: Patient agrees to discharge.  Patient goals were partially met. Patient is being discharged due to being pleased with the current functional level.  ?????       SLaureen Abrahams PT, DPT 11/23/19 2:02 PM   [facility] [address]GW[address] [place] [address]Phone: [phone]  Fax:  [phone]

## 2019-10-06 ENCOUNTER — Encounter: Payer: Medicare PPO | Admitting: Physical Therapy

## 2019-10-06 ENCOUNTER — Ambulatory Visit (INDEPENDENT_AMBULATORY_CARE_PROVIDER_SITE_OTHER): Payer: Medicare PPO | Admitting: Orthopedic Surgery

## 2019-10-06 ENCOUNTER — Encounter: Payer: Self-pay | Admitting: Orthopedic Surgery

## 2019-10-06 VITALS — Ht 67.0 in | Wt 285.2 lb

## 2019-10-06 DIAGNOSIS — Z96652 Presence of left artificial knee joint: Secondary | ICD-10-CM | POA: Diagnosis not present

## 2019-10-06 DIAGNOSIS — M25562 Pain in left knee: Secondary | ICD-10-CM | POA: Diagnosis not present

## 2019-10-06 DIAGNOSIS — G8929 Other chronic pain: Secondary | ICD-10-CM

## 2019-10-06 DIAGNOSIS — Z96611 Presence of right artificial shoulder joint: Secondary | ICD-10-CM | POA: Diagnosis not present

## 2019-10-06 DIAGNOSIS — M25561 Pain in right knee: Secondary | ICD-10-CM

## 2019-10-06 NOTE — Progress Notes (Signed)
Post-Op Visit Note   Patient: [name]           Date of Birth: 1945/09/10           MRN: [id number] Visit Date: 10/06/2019 PCP: Rose Phi, FNP   Assessment & Plan:  Chief Complaint:  Chief Complaint  Patient presents with  . Right Shoulder - Routine Post Op   Visit Diagnoses:  1. Chronic pain of both knees     Plan: Patient is a 74 year old female who presents s/p right reverse shoulder arthroplasty on 07/26/2019.  She is doing well overall.  She is not taking any pain medication for her shoulder.  She is able to sleep well without pain from her shoulder waking her up.  Her incision is healing well.  She has excellent subscapularis strength.  Range of motion is excellent as well with 35 degrees external rotation, 130 degrees of forward flexion, 95 degrees abduction.  She is very satisfied with her right shoulder results and has excellent functional range of motion.  Plan to discontinue physical therapy and continue with home exercise program.  Patient's main concern is her left knee pain.  She has history of left total knee arthroplasty 4 years ago.  She notes that she has had pain the entirety of the last 4 years.  Pain is getting worse.  It is occasionally up to a "15 out of 10 pain".  She denies any fevers, chills, night sweats, drainage.  She does note that she has swelling in the left knee that comes and goes.  Ordered CBC with differential, ESR, CRP along with CT scan of the left knee as well as a bone scan of the left knee for evaluation of loosening versus infection.  Patient agreed with plan.  Follow-up after results for review.  Follow-Up Instructions: No follow-ups on file.   Orders:  Orders Placed This Encounter  Procedures  . NM Bone Scan 3 Phase Lower Extremity  . CT KNEE LEFT WO CONTRAST  . CBC with Differential  . Sed Rate (ESR)  . C-reactive protein   No orders of the defined types were placed in this encounter.   Imaging: No results  found.  PMFS History: Patient Active Problem List   Diagnosis Date Noted  . 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 Osteoarthritis of left knee 02/16/2015  . OBESITY 10/25/2007  . HIATAL HERNIA 10/25/2007  . ABDOMINAL PAIN-EPIGASTRIC 10/25/2007   Past Medical History:  Diagnosis Date  . Anxiety   . Arthritis    osteoarthritis left knee, right shoulder "locks" occ.  . Diabetes mellitus without complication ([place])   . H/O seasonal allergies   . History of hiatal hernia   . Hypertension   . Pneumonia    as a child  . Sleep apnea    no cpap-continues to be evaluated    No family history on file.  Past Surgical History:  Procedure Laterality Date  . ABDOMINAL HYSTERECTOMY    . BACK SURGERY     lumbar fusion  . CATARACT EXTRACTION, BILATERAL    . CHOLECYSTECTOMY    . COLONOSCOPY WITH PROPOFOL N/A 07/30/2017   Procedure: COLONOSCOPY WITH PROPOFOL;  Surgeon: Juanita Craver, MD;  [facility];  Service: Endoscopy;  Laterality: N/A;  . EXPLORATORY LAPAROTOMY     ovaries freed from intestines.  Marland Kitchen FOOT SURGERY     retained hardware.  Marland Kitchen KNEE ARTHROSCOPY Right    scope surgery  .  TONSILLECTOMY    . TOTAL KNEE ARTHROPLASTY Left 02/16/2015   Procedure: LEFT TOTAL KNEE ARTHROPLASTY;  Surgeon: Sydnee Cabal, MD;  [facility];  Service: Orthopedics;  Laterality: Left;  . TOTAL SHOULDER ARTHROPLASTY Right 07/26/2019   Procedure: RIGHT TOTAL SHOULDER ARTHROPLASTY;  Surgeon: Meredith Pel, MD;  Location: [facility];  Service: Orthopedics;  Laterality: Right;  . WRIST SURGERY Left    x2 "cysts"   Social History   Occupational History  . Not on file  Tobacco Use  . Smoking status: Former Smoker    Packs/day: 0.50    Years: 10.00    Pack years: 5.00    Types: Cigarettes    Quit date: 02/07/1973    Years since quitting: 46.6  . Smokeless tobacco: Never Used  Substance and Sexual Activity  . Alcohol use: Yes    Comment: rare  occ.  . Drug use: No  . Sexual activity: Not on file

## 2019-10-07 LAB — CBC WITH DIFFERENTIAL/PLATELET
Absolute Monocytes: 439 cells/uL (ref 200–950)
Basophils Absolute: 31 cells/uL (ref 0–200)
Basophils Relative: 0.4 %
Eosinophils Absolute: 108 cells/uL (ref 15–500)
Eosinophils Relative: 1.4 %
HCT: 40.1 % (ref 35.0–45.0)
Hemoglobin: 13.2 g/dL (ref 11.7–15.5)
Lymphs Abs: 1802 cells/uL (ref 850–3900)
MCH: 27.6 pg (ref 27.0–33.0)
MCHC: 32.9 g/dL (ref 32.0–36.0)
MCV: 83.7 fL (ref 80.0–100.0)
MPV: 11.9 fL (ref 7.5–12.5)
Monocytes Relative: 5.7 %
Neutro Abs: 5321 cells/uL (ref 1500–7800)
Neutrophils Relative %: 69.1 %
Platelets: 332 10*3/uL (ref 140–400)
RBC: 4.79 10*6/uL (ref 3.80–5.10)
RDW: 14.4 % (ref 11.0–15.0)
Total Lymphocyte: 23.4 %
WBC: 7.7 10*3/uL (ref 3.8–10.8)

## 2019-10-07 LAB — SEDIMENTATION RATE: Sed Rate: 17 mm/h (ref 0–30)

## 2019-10-07 LAB — C-REACTIVE PROTEIN: CRP: 10.6 mg/L — ABNORMAL HIGH (ref <8.0)

## 2019-10-07 NOTE — Progress Notes (Signed)
Labs ok does not look infected

## 2019-10-11 ENCOUNTER — Encounter: Payer: Medicare PPO | Admitting: Physical Therapy

## 2019-10-13 ENCOUNTER — Encounter: Payer: Medicare PPO | Admitting: Physical Therapy

## 2019-10-18 ENCOUNTER — Encounter: Payer: Medicare PPO | Admitting: Physical Therapy

## 2019-10-20 ENCOUNTER — Encounter: Payer: Medicare PPO | Admitting: Physical Therapy

## 2019-10-24 ENCOUNTER — Encounter: Payer: Medicare PPO | Admitting: Physical Therapy

## 2019-10-26 ENCOUNTER — Encounter: Payer: Medicare PPO | Admitting: Physical Therapy

## 2019-10-31 ENCOUNTER — Ambulatory Visit (HOSPITAL_COMMUNITY)
Admission: RE | Admit: 2019-10-31 | Discharge: 2019-10-31 | Disposition: A | Discharge status: Home / Self Care | Payer: Medicare PPO | Source: Ambulatory Visit | Attending: Orthopedic Surgery | Admitting: Orthopedic Surgery

## 2019-10-31 ENCOUNTER — Other Ambulatory Visit: Payer: Self-pay

## 2019-10-31 DIAGNOSIS — G8929 Other chronic pain: Secondary | ICD-10-CM

## 2019-10-31 DIAGNOSIS — M25562 Pain in left knee: Secondary | ICD-10-CM

## 2019-10-31 DIAGNOSIS — M25561 Pain in right knee: Secondary | ICD-10-CM | POA: Diagnosis present

## 2019-10-31 MED ORDER — TECHNETIUM TC 99M MEDRONATE IV KIT
20.0000 | PACK | Freq: Once | INTRAVENOUS | Status: AC | PRN
  Administered 2019-10-31: 20 via INTRAVENOUS

## 2019-11-02 ENCOUNTER — Ambulatory Visit (INDEPENDENT_AMBULATORY_CARE_PROVIDER_SITE_OTHER): Payer: Medicare PPO

## 2019-11-02 ENCOUNTER — Ambulatory Visit (INDEPENDENT_AMBULATORY_CARE_PROVIDER_SITE_OTHER): Payer: Medicare PPO | Admitting: Orthopedic Surgery

## 2019-11-02 VITALS — Ht 67.0 in | Wt 285.0 lb

## 2019-11-02 DIAGNOSIS — M79605 Pain in left leg: Secondary | ICD-10-CM | POA: Diagnosis not present

## 2019-11-05 ENCOUNTER — Encounter: Payer: Self-pay | Admitting: Orthopedic Surgery

## 2019-11-05 NOTE — Progress Notes (Signed)
Office Visit Note   Patient: [name]           Date of Birth: [birth year]           MRN: [id number] Visit Date: 11/02/2019 Requested by: Rose Phi, [place],  [place] [address] PCP: Rose Phi, FNP  Subjective: Chief Complaint  Patient presents with  . Follow-up    HPI: Calani is a patient here for CT scan review and bone scan review.  Had left total knee replacement done 4 years ago with some worsening pain since then.  Has some tenderness to palpation around the tibial shaft region.  Bone scan shows nonspecific tracer uptake around the left patella.  The left patella uptake has decreased in intensity since the previous exam 2 years ago.  CT scan of the left knee shows no evidence of periprosthetic lucency or fracture.  She reports continued right knee pain.  She is interested in getting the right knee replaced.  Has end-stage arthritis on that side.  Her blood work was normal sed rate C-reactive protein and white blood count.  Denies much in the way of radicular symptoms.              ROS: All systems reviewed are negative as they relate to the chief complaint within the history of present illness.  Patient denies  fevers or chills.   Assessment & Plan: Visit Diagnoses:  1. Pain in left leg     Plan: Impression is painful left total knee replacement with no real radiographic evidence CT evidence or laboratory evidence or physical exam evidence of a actionable problem.  Patient also has right knee arthritis.  She needs to get down to around 255 pounds for her height at 5 7 to get her BMI under 40 for consideration of right knee replacement.  See her back as needed.  Follow-Up Instructions: Return if symptoms worsen or fail to improve.   Orders:  Orders Placed This Encounter  Procedures  . XR Lumbar Spine 2-3 Views   No orders of the defined types were placed in this encounter.     Procedures: No procedures  performed   Clinical Data: No additional findings.  Objective: Vital Signs: Ht 5\' 7"  (1.702 m)   Wt 285 lb (129.3 kg)   BMI 44.64 kg/m   Physical Exam:   Constitutional: Patient appears well-developed HEENT:  Head: Normocephalic Eyes:EOM are normal Neck: Normal range of motion Cardiovascular: Normal rate Pulmonary/chest: Effort normal Neurologic: Patient is alert Skin: Skin is warm Psychiatric: Patient has normal mood and affect    Ortho Exam: Ortho exam demonstrates good range of motion of that left knee with no effusion or warmth.  No nerve root tension signs.  Right knee has global pain and tenderness medial lateral joint lines with intact extensor mechanism.  Pedal pulses palpable.  No masses lymphadenopathy or skin changes noted in either knee region.  Specialty Comments:  No specialty comments available.  Imaging: No results found.   PMFS History: Patient Active Problem List   Diagnosis Date Noted  . 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 Osteoarthritis of left knee 02/16/2015  . OBESITY 10/25/2007  . HIATAL HERNIA 10/25/2007  . ABDOMINAL PAIN-EPIGASTRIC 10/25/2007   Past Medical History:  Diagnosis Date  . Anxiety   . Arthritis    osteoarthritis left knee, right shoulder "locks" occ.  . Diabetes mellitus without complication ([place])   . H/O  seasonal allergies   . History of hiatal hernia   . Hypertension   . Pneumonia    as a child  . Sleep apnea    no cpap-continues to be evaluated    History reviewed. No pertinent family history.  Past Surgical History:  Procedure Laterality Date  . ABDOMINAL HYSTERECTOMY    . BACK SURGERY     lumbar fusion  . CATARACT EXTRACTION, BILATERAL    . CHOLECYSTECTOMY    . COLONOSCOPY WITH PROPOFOL N/A 07/30/2017   Procedure: COLONOSCOPY WITH PROPOFOL;  Surgeon: Juanita Craver, MD;  [facility];  Service: Endoscopy;  Laterality: N/A;  . EXPLORATORY LAPAROTOMY      ovaries freed from intestines.  Marland Kitchen FOOT SURGERY     retained hardware.  Marland Kitchen KNEE ARTHROSCOPY Right    scope surgery  . TONSILLECTOMY    . TOTAL KNEE ARTHROPLASTY Left 02/16/2015   Procedure: LEFT TOTAL KNEE ARTHROPLASTY;  Surgeon: Sydnee Cabal, MD;  [facility];  Service: Orthopedics;  Laterality: Left;  . TOTAL SHOULDER ARTHROPLASTY Right 07/26/2019   Procedure: RIGHT TOTAL SHOULDER ARTHROPLASTY;  Surgeon: Meredith Pel, MD;  Location: [facility];  Service: Orthopedics;  Laterality: Right;  . WRIST SURGERY Left    x2 "cysts"   Social History   Occupational History  . Not on file  Tobacco Use  . Smoking status: Former Smoker    Packs/day: 0.50    Years: 10.00    Pack years: 5.00    Types: Cigarettes    Quit date: 02/07/1973    Years since quitting: 46.7  . Smokeless tobacco: Never Used  Substance and Sexual Activity  . Alcohol use: Yes    Comment: rare occ.  . Drug use: No  . Sexual activity: Not on file

## 2019-11-16 ENCOUNTER — Ambulatory Visit (INDEPENDENT_AMBULATORY_CARE_PROVIDER_SITE_OTHER): Payer: Medicare PPO | Admitting: Orthopedic Surgery

## 2019-11-16 DIAGNOSIS — M5410 Radiculopathy, site unspecified: Secondary | ICD-10-CM | POA: Diagnosis not present

## 2019-11-17 ENCOUNTER — Telehealth: Payer: Self-pay | Admitting: Orthopedic Surgery

## 2019-11-17 NOTE — Telephone Encounter (Signed)
Pls advise thanks.  

## 2019-11-17 NOTE — Telephone Encounter (Signed)
Patient called.   She is requesting a refill on her antiinflammatory be sent to her [facility] pharmacy in [place]   Call back: [phone]

## 2019-11-18 ENCOUNTER — Other Ambulatory Visit: Payer: Self-pay | Admitting: Surgical

## 2019-11-18 MED ORDER — CELECOXIB 200 MG PO CAPS: ORAL_CAPSULE | ORAL | 0 refills | Status: DC

## 2019-11-25 ENCOUNTER — Other Ambulatory Visit: Payer: Self-pay

## 2019-11-25 ENCOUNTER — Encounter: Payer: Self-pay | Admitting: Orthopedic Surgery

## 2019-11-25 ENCOUNTER — Ambulatory Visit
Admission: RE | Admit: 2019-11-25 | Discharge: 2019-11-25 | Disposition: A | Discharge status: Home / Self Care | Payer: Medicare PPO | Source: Ambulatory Visit | Attending: Orthopedic Surgery | Admitting: Orthopedic Surgery

## 2019-11-25 DIAGNOSIS — M5410 Radiculopathy, site unspecified: Secondary | ICD-10-CM

## 2019-11-25 NOTE — Progress Notes (Signed)
Office Visit Note   Patient: [name]           Date of Birth: 23-Apr-1945           MRN: [id number] Visit Date: 11/16/2019 Requested by: Rose Phi, [place],  [place] [address] PCP: Rose Phi, FNP  Subjective: Chief Complaint  Patient presents with  . Follow-up    HPI: Deshawn is a 74 year old patient with left sided radiculopathy.  She has a "painful left knee replacement".  Cannot really find too much going on with that left knee replacement.  She does have a radicular component to her pain.  She wants to get the right knee replaced.  She has to lose a little bit of weight before that occurs.             ROS: All systems reviewed are negative as they relate to the chief complaint within the history of present illness.  Patient denies  fevers or chills.   Assessment & Plan: Visit Diagnoses:  1. Radicular leg pain     Plan: Impression is radicular leg pain on the left-hand side.  The knee replacement functions well and we cannot really find any evidence of loosening or infection.  I think there could be a radicular component to this issue.  For that reason MRI scan is ordered.  Looking for MRI L-spine to evaluate sources of left-sided radiculopathy.  Possible ESI's to follow if that source is identified.  Would not recommend any type of revision procedure to that left knee at this time without clear-cut evidence of mechanical or structural failure.  Follow-Up Instructions: No follow-ups on file.   Orders:  Orders Placed This Encounter  Procedures  . MR Lumbar Spine w/o contrast   No orders of the defined types were placed in this encounter.     Procedures: No procedures performed   Clinical Data: No additional findings.  Objective: Vital Signs: There were no vitals taken for this visit.  Physical Exam:   Constitutional: Patient appears well-developed HEENT:  Head: Normocephalic Eyes:EOM are normal Neck: Normal range  of motion Cardiovascular: Normal rate Pulmonary/chest: Effort normal Neurologic: Patient is alert Skin: Skin is warm Psychiatric: Patient has normal mood and affect    Ortho Exam: Ortho exam demonstrates tenderness to palpation around the right knee with no effusion.  No groin pain on either side.  Pedal pulses palpable.  Equivocal nerve root tension signs on the left but she has pretty good range of motion of the left knee with no effusion or warmth.  Ankle dorsiflexion plantarflexion quad hamstring strength 5+ out of 5 bilaterally.  Does have some numbness on the lateral aspect of that left knee incision.  Difficult to say if she has actual paresthesias from radiculopathy  Specialty Comments:  No specialty comments available.  Imaging: MR Lumbar Spine w/o contrast  Result Date: 11/25/2019 CLINICAL DATA:  Low back pain and bilateral lower extremity swelling and numbness for 3 years, worsening. History of prior lumbar surgery. EXAM: MRI LUMBAR SPINE WITHOUT CONTRAST TECHNIQUE: Multiplanar, multisequence MR imaging of the lumbar spine was performed. No intravenous contrast was administered. COMPARISON:  MRI lumbar spine 12/20/2003. FINDINGS: Segmentation:  Standard. Alignment:  Maintained. Vertebrae: No fracture, evidence of discitis, or bone lesion. The patient has undergone L4-S1 fusion since the prior MRI. Conus medullaris and cauda equina: Conus extends to the L2 level. Conus and cauda equina appear normal. Paraspinal and other soft tissues: Negative. Disc levels: T11-12  is imaged in the sagittal plane only and negative. T12-L1: Shallow disc bulge and mild facet degenerative disease. The central canal and foramina remain open. L1-2: Shallow disc bulge and mild-to-moderate facet degenerative disease. No stenosis. L2-3: Shallow disc bulge and moderate facet arthropathy have progressed since the prior exam. The central canal and foramina remain open. L3-4: Mild disc bulge and facet degenerative  disease have mildly progressed. No stenosis. L4-5: Status post laminectomy and fusion.  No stenosis. L5-S1: Status post laminectomy and fusion.  No stenosis. IMPRESSION: Some progression of lumbar degenerative disease from L1-2 to L3-4 since 2005 without central canal or foraminal stenosis as described above. Status post L4-S1 laminectomy and fusion since the prior MRI. The central canal and foramina are widely patent. Electronically Signed   By: Inge Rise M.D.   On: 11/25/2019 11:37     PMFS History: Patient Active Problem List   Diagnosis Date Noted  . 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 Osteoarthritis of left knee 02/16/2015  . OBESITY 10/25/2007  . HIATAL HERNIA 10/25/2007  . ABDOMINAL PAIN-EPIGASTRIC 10/25/2007   Past Medical History:  Diagnosis Date  . Anxiety   . Arthritis    osteoarthritis left knee, right shoulder "locks" occ.  . Diabetes mellitus without complication ([place])   . H/O seasonal allergies   . History of hiatal hernia   . Hypertension   . Pneumonia    as a child  . Sleep apnea    no cpap-continues to be evaluated    No family history on file.  Past Surgical History:  Procedure Laterality Date  . ABDOMINAL HYSTERECTOMY    . BACK SURGERY     lumbar fusion  . CATARACT EXTRACTION, BILATERAL    . CHOLECYSTECTOMY    . COLONOSCOPY WITH PROPOFOL N/A 07/30/2017   Procedure: COLONOSCOPY WITH PROPOFOL;  Surgeon: Juanita Craver, MD;  [facility];  Service: Endoscopy;  Laterality: N/A;  . EXPLORATORY LAPAROTOMY     ovaries freed from intestines.  Marland Kitchen FOOT SURGERY     retained hardware.  Marland Kitchen KNEE ARTHROSCOPY Right    scope surgery  . TONSILLECTOMY    . TOTAL KNEE ARTHROPLASTY Left 02/16/2015   Procedure: LEFT TOTAL KNEE ARTHROPLASTY;  Surgeon: Sydnee Cabal, MD;  [facility];  Service: Orthopedics;  Laterality: Left;  . TOTAL SHOULDER ARTHROPLASTY Right 07/26/2019   Procedure: RIGHT TOTAL SHOULDER  ARTHROPLASTY;  Surgeon: Meredith Pel, MD;  Location: [facility];  Service: Orthopedics;  Laterality: Right;  . WRIST SURGERY Left    x2 "cysts"   Social History   Occupational History  . Not on file  Tobacco Use  . Smoking status: Former Smoker    Packs/day: 0.50    Years: 10.00    Pack years: 5.00    Types: Cigarettes    Quit date: 02/07/1973    Years since quitting: 46.8  . Smokeless tobacco: Never Used  Substance and Sexual Activity  . Alcohol use: Yes    Comment: rare occ.  . Drug use: No  . Sexual activity: Not on file

## 2019-11-30 ENCOUNTER — Ambulatory Visit (INDEPENDENT_AMBULATORY_CARE_PROVIDER_SITE_OTHER): Payer: Medicare PPO | Admitting: Orthopedic Surgery

## 2019-11-30 DIAGNOSIS — M79605 Pain in left leg: Secondary | ICD-10-CM | POA: Diagnosis not present

## 2019-12-03 ENCOUNTER — Encounter: Payer: Self-pay | Admitting: Orthopedic Surgery

## 2019-12-03 NOTE — Progress Notes (Signed)
Office Visit Note   Patient: [name]           Date of Birth: 1945/09/04           MRN: [id number] Visit Date: 11/30/2019 Requested by: Rose Phi, [place],  [place] [address] PCP: Rose Phi, FNP  Subjective: Chief Complaint  Patient presents with  . MRI review    HPI: [name] is a patient with lateral left-sided pain and numbness in the left foot.  States that her back and leg is getting tight.  Weightbearing is worse.  Since she was last seen she is had an MRI scan which has been done.  That shows some progression of lumbar degenerative disease from L1-2 to L3-4.  She has also had L4-S1 laminectomy and fusion with central canal and foramina widely patent.  She does describe primarily radicular symptoms going down the left side of her back and down the left leg.              ROS: All systems reviewed are negative as they relate to the chief complaint within the history of present illness.  Patient denies  fevers or chills.   Assessment & Plan: Visit Diagnoses:  1. Pain in left leg     Plan: Impression is encouraging appearing MRI scan but likely there is some scar tissue which is affecting her left leg making the foot numb.  No real tenderness around the fibular head on the left-hand side.  I think a diagnostic and therapeutic ESI is indicated to see if she has some postsurgical pain.  No real further work-up indicated at this time.  We will see how she does with the injection with Dr. Ernestina Patches  Follow-Up Instructions: No follow-ups on file.   Orders:  Orders Placed This Encounter  Procedures  . Ambulatory referral to Physical Medicine Rehab   No orders of the defined types were placed in this encounter.     Procedures: No procedures performed   Clinical Data: No additional findings.  Objective: Vital Signs: There were no vitals taken for this visit.  Physical Exam:   Constitutional: Patient appears  well-developed HEENT:  Head: Normocephalic Eyes:EOM are normal Neck: Normal range of motion Cardiovascular: Normal rate Pulmonary/chest: Effort normal Neurologic: Patient is alert Skin: Skin is warm Psychiatric: Patient has normal mood and affect    Ortho Exam: Ortho exam demonstrates no nerve root tension signs bilaterally.  No real peroneal nerve tenderness to palpation around the fibular head left the right-hand side.  No knee effusion.  Collateral cruciate ligaments are stable on that left-hand side.  No groin pain internal extra rotation of the leg on the left or right.  No trochanteric tenderness is noted.  Mild pain with forward lateral bending.  No muscle atrophy left leg versus right.  Does have some paresthesias in the L5 distribution on the left side compared to the right.  Specialty Comments:  No specialty comments available.  Imaging: No results found.   PMFS History: Patient Active Problem List   Diagnosis Date Noted  . 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 Osteoarthritis of left knee 02/16/2015  . OBESITY 10/25/2007  . HIATAL HERNIA 10/25/2007  . ABDOMINAL PAIN-EPIGASTRIC 10/25/2007   Past Medical History:  Diagnosis Date  . Anxiety   . Arthritis    osteoarthritis left knee, right shoulder "locks" occ.  . Diabetes mellitus without complication ([place])   . H/O seasonal  allergies   . History of hiatal hernia   . Hypertension   . Pneumonia    as a child  . Sleep apnea    no cpap-continues to be evaluated    No family history on file.  Past Surgical History:  Procedure Laterality Date  . ABDOMINAL HYSTERECTOMY    . BACK SURGERY     lumbar fusion  . CATARACT EXTRACTION, BILATERAL    . CHOLECYSTECTOMY    . COLONOSCOPY WITH PROPOFOL N/A 07/30/2017   Procedure: COLONOSCOPY WITH PROPOFOL;  Surgeon: Juanita Craver, MD;  Location: [facility];  Service: Endoscopy;  Laterality: N/A;  . EXPLORATORY LAPAROTOMY      ovaries freed from intestines.  Marland Kitchen FOOT SURGERY     retained hardware.  Marland Kitchen KNEE ARTHROSCOPY Right    scope surgery  . TONSILLECTOMY    . TOTAL KNEE ARTHROPLASTY Left 02/16/2015   Procedure: LEFT TOTAL KNEE ARTHROPLASTY;  Surgeon: Sydnee Cabal, MD;  Location: [facility];  Service: Orthopedics;  Laterality: Left;  . TOTAL SHOULDER ARTHROPLASTY Right 07/26/2019   Procedure: RIGHT TOTAL SHOULDER ARTHROPLASTY;  Surgeon: Meredith Pel, MD;  Location: [facility];  Service: Orthopedics;  Laterality: Right;  . WRIST SURGERY Left    x2 "cysts"   Social History   Occupational History  . Not on file  Tobacco Use  . Smoking status: Former Smoker    Packs/day: 0.50    Years: 10.00    Pack years: 5.00    Types: Cigarettes    Quit date: 02/07/1973    Years since quitting: 46.8  . Smokeless tobacco: Never Used  Substance and Sexual Activity  . Alcohol use: Yes    Comment: rare occ.  . Drug use: No  . Sexual activity: Not on file

## 2019-12-26 ENCOUNTER — Ambulatory Visit: Payer: Medicare PPO | Admitting: Physical Medicine and Rehabilitation

## 2019-12-26 ENCOUNTER — Encounter: Payer: Self-pay | Admitting: Physical Medicine and Rehabilitation

## 2019-12-26 ENCOUNTER — Ambulatory Visit: Payer: Self-pay

## 2019-12-26 ENCOUNTER — Other Ambulatory Visit: Payer: Self-pay

## 2019-12-26 DIAGNOSIS — M461 Sacroiliitis, not elsewhere classified: Secondary | ICD-10-CM

## 2019-12-26 MED ORDER — METHYLPREDNISOLONE ACETATE 80 MG/ML IJ SUSP
80.0000 mg | INTRAMUSCULAR | Status: AC | PRN
  Administered 2019-12-26: 80 mg via INTRA_ARTICULAR

## 2019-12-26 MED ORDER — BUPIVACAINE HCL 0.5 % IJ SOLN
2.0000 mL | INTRAMUSCULAR | Status: AC | PRN
Start: 2019-12-26 — End: 2019-12-26
  Administered 2019-12-26: 2 mL via INTRA_ARTICULAR

## 2019-12-26 NOTE — Progress Notes (Signed)
[name] - 74 y.o. female MRN [id number]  Date of birth: Sep 10, 1945  Office Visit Note: Visit Date: 12/26/2019 PCP: Rose Phi, FNP Referred by: Rose Phi, FNP  Subjective: Chief Complaint  Patient presents with  . Lower Back - Pain  . Left Hip - Pain   HPI:  [name] is a 74 y.o. female who comes in today at the request of Dr. Anderson Malta for planned Left Sacroiliac joint injection with fluoroscopic guidance.  The patient has failed conservative care including home exercise, medications, time and activity modification.  This injection will be diagnostic and hopefully therapeutic.  Please see requesting physician notes for further details and justification.   ROS Otherwise per HPI.  Assessment & Plan: Visit Diagnoses:  1. Sacroiliitis ([place])     Plan: No additional findings.   Meds & Orders: No orders of the defined types were placed in this encounter.   Orders Placed This Encounter  Procedures  . Sacroiliac Joint Inj  . XR C-ARM NO REPORT    Follow-up: Return for visit to requesting physician as needed.   Procedures: Sacroiliac Joint Inj (Left) on 12/26/2019 8:39 AM Indications: pain and diagnostic evaluation Details: 22 G 3.5 in needle, fluoroscopy-guided posterior approach Medications: 2 mL bupivacaine 0.5 %; 80 mg methylPREDNISolone acetate 80 MG/ML Outcome: tolerated well, no immediate complications  There was excellent flow of contrast producing a partial arthrogram of the sacroiliac joint.  Procedure, treatment alternatives, risks and benefits explained, specific risks discussed. Consent was given by the patient. Immediately prior to procedure a time out was called to verify the correct patient, procedure, equipment, support staff and site/side marked as required. Patient was prepped and draped in the usual sterile fashion.      No notes on file   Clinical History: Low back pain and bilateral lower extremity swelling and  numbness for 3 years, worsening. History of prior lumbar surgery.  EXAM: MRI LUMBAR SPINE WITHOUT CONTRAST  TECHNIQUE: Multiplanar, multisequence MR imaging of the lumbar spine was performed. No intravenous contrast was administered.  COMPARISON:  MRI lumbar spine 12/20/2003.  FINDINGS: Segmentation:  Standard.  Alignment:  Maintained.  Vertebrae: No fracture, evidence of discitis, or bone lesion. The patient has undergone L4-S1 fusion since the prior MRI.  Conus medullaris and cauda equina: Conus extends to the L2 level. Conus and cauda equina appear normal.  Paraspinal and other soft tissues: Negative.  Disc levels:  T11-12 is imaged in the sagittal plane only and negative.  T12-L1: Shallow disc bulge and mild facet degenerative disease. The central canal and foramina remain open.  L1-2: Shallow disc bulge and mild-to-moderate facet degenerative disease. No stenosis.  L2-3: Shallow disc bulge and moderate facet arthropathy have progressed since the prior exam. The central canal and foramina remain open.  L3-4: Mild disc bulge and facet degenerative disease have mildly progressed. No stenosis.  L4-5: Status post laminectomy and fusion.  No stenosis.  L5-S1: Status post laminectomy and fusion.  No stenosis.  IMPRESSION: Some progression of lumbar degenerative disease from L1-2 to L3-4 since 2005 without central canal or foraminal stenosis as described above.  Status post L4-S1 laminectomy and fusion since the prior MRI. The central canal and foramina are widely patent.   Electronically Signed   By: Inge Rise M.D.   On: 11/25/2019 11:37     Objective:  VS:  HT:    WT:   BMI:     BP:   HR: bpm  TEMP: ( )  RESP:  Physical Exam Constitutional:      General: She is not in acute distress.    Appearance: Normal appearance. She is not ill-appearing.  HENT:     Head: Normocephalic and atraumatic.     Right Ear: External ear  normal.     Left Ear: External ear normal.  Eyes:     Extraocular Movements: Extraocular movements intact.  Cardiovascular:     Rate and Rhythm: Normal rate.     Pulses: Normal pulses.  Musculoskeletal:     Right lower leg: No edema.     Left lower leg: No edema.     Comments: Patient has good distal strength with no pain over the greater trochanters.  No clonus or focal weakness. Positive Fortin Finger sign and equivocal right Patrick's  Skin:    Findings: No erythema, lesion or rash.  Neurological:     General: No focal deficit present.     Mental Status: She is alert and oriented to person, place, and time.     Sensory: No sensory deficit.     Motor: No weakness or abnormal muscle tone.     Coordination: Coordination normal.  Psychiatric:        Mood and Affect: Mood normal.        Behavior: Behavior normal.      Imaging: No results found.

## 2019-12-26 NOTE — Progress Notes (Signed)
Pt state lower back and left hip pain. Pt state standing for a long period of time makes the pain worse. Pt state laying down and going to bed makes the pain feel better.  Numeric Pain Rating Scale and Functional Assessment Average Pain 7   In the last MONTH (on 0-10 scale) has pain interfered with the following?  1. General activity like being  able to carry out your everyday physical activities such as walking, climbing stairs, carrying groceries, or moving a chair?  Rating(10)   +Driver, -BT, -Dye Allergies.

## 2020-03-06 ENCOUNTER — Other Ambulatory Visit: Payer: Self-pay | Admitting: Surgical

## 2020-03-06 ENCOUNTER — Telehealth: Payer: Self-pay

## 2020-03-06 MED ORDER — CELECOXIB 200 MG PO CAPS: ORAL_CAPSULE | ORAL | 0 refills | Status: DC

## 2020-03-06 NOTE — Telephone Encounter (Signed)
Please advise. Thanks.  

## 2020-03-06 NOTE — Telephone Encounter (Signed)
Patient called in for refill of celecoxib

## 2020-03-06 NOTE — Telephone Encounter (Signed)
submitted

## 2020-03-14 DEATH — deceased

## 2020-03-16 ENCOUNTER — Other Ambulatory Visit: Payer: Self-pay | Admitting: Urgent Care

## 2020-03-16 DIAGNOSIS — Z1231 Encounter for screening mammogram for malignant neoplasm of breast: Secondary | ICD-10-CM

## 2020-05-11 ENCOUNTER — Ambulatory Visit: Payer: Medicare PPO | Admitting: Orthopedic Surgery

## 2020-05-18 ENCOUNTER — Ambulatory Visit: Payer: Medicare PPO

## 2020-05-29 ENCOUNTER — Other Ambulatory Visit: Payer: Self-pay

## 2020-05-29 ENCOUNTER — Ambulatory Visit
Admission: RE | Admit: 2020-05-29 | Discharge: 2020-05-29 | Disposition: A | Discharge status: Home / Self Care | Payer: Medicare Other | Source: Ambulatory Visit | Attending: Urgent Care | Admitting: Urgent Care

## 2020-05-29 DIAGNOSIS — Z1231 Encounter for screening mammogram for malignant neoplasm of breast: Secondary | ICD-10-CM

## 2020-06-11 ENCOUNTER — Ambulatory Visit (HOSPITAL_COMMUNITY)
Admission: EM | Admit: 2020-06-11 | Discharge: 2020-06-11 | Disposition: A | Discharge status: Home / Self Care | Payer: Medicare Other | Attending: Emergency Medicine | Admitting: Emergency Medicine

## 2020-06-11 ENCOUNTER — Encounter (HOSPITAL_COMMUNITY): Payer: Self-pay

## 2020-06-11 ENCOUNTER — Other Ambulatory Visit: Payer: Self-pay

## 2020-06-11 DIAGNOSIS — M79631 Pain in right forearm: Secondary | ICD-10-CM

## 2020-06-11 MED ORDER — ACETAMINOPHEN 325 MG PO TABS: 650.0000 mg | ORAL_TABLET | Freq: Four times a day (QID) | ORAL | 0 refills | Status: DC | PRN

## 2020-06-11 NOTE — Discharge Instructions (Addendum)
Can take 650 mg of tylenol every six hours as needed  Gentle ROM stretching as tolerated   Heating pad in 15 minute intervals
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
## 2021-01-16 ENCOUNTER — Other Ambulatory Visit: Payer: Self-pay | Admitting: Family Medicine

## 2021-01-18 ENCOUNTER — Other Ambulatory Visit: Payer: Self-pay | Admitting: Internal Medicine

## 2021-01-18 DIAGNOSIS — R229 Localized swelling, mass and lump, unspecified: Secondary | ICD-10-CM

## 2021-01-22 ENCOUNTER — Ambulatory Visit
Admission: RE | Admit: 2021-01-22 | Discharge: 2021-01-22 | Disposition: A | Discharge status: Home / Self Care | Payer: Medicare Other | Source: Ambulatory Visit | Attending: Internal Medicine | Admitting: Internal Medicine

## 2021-01-22 ENCOUNTER — Other Ambulatory Visit: Payer: Self-pay

## 2021-01-22 DIAGNOSIS — R229 Localized swelling, mass and lump, unspecified: Secondary | ICD-10-CM

## 2021-05-01 ENCOUNTER — Other Ambulatory Visit: Payer: Self-pay | Admitting: Urgent Care

## 2021-05-01 ENCOUNTER — Other Ambulatory Visit: Payer: Self-pay | Admitting: Family Medicine

## 2021-05-01 DIAGNOSIS — Z1231 Encounter for screening mammogram for malignant neoplasm of breast: Secondary | ICD-10-CM

## 2021-05-30 ENCOUNTER — Ambulatory Visit
Admission: RE | Admit: 2021-05-30 | Discharge: 2021-05-30 | Disposition: A | Discharge status: Home / Self Care | Payer: Medicare PPO | Source: Ambulatory Visit | Attending: Family Medicine | Admitting: Family Medicine

## 2021-05-30 DIAGNOSIS — Z1231 Encounter for screening mammogram for malignant neoplasm of breast: Secondary | ICD-10-CM

## 2021-11-12 ENCOUNTER — Other Ambulatory Visit: Payer: Self-pay

## 2021-11-12 ENCOUNTER — Encounter (HOSPITAL_BASED_OUTPATIENT_CLINIC_OR_DEPARTMENT_OTHER): Payer: Self-pay | Admitting: General Surgery

## 2021-11-14 ENCOUNTER — Ambulatory Visit: Payer: Self-pay | Admitting: General Surgery

## 2021-11-15 ENCOUNTER — Encounter (HOSPITAL_BASED_OUTPATIENT_CLINIC_OR_DEPARTMENT_OTHER)
Admission: RE | Admit: 2021-11-15 | Discharge: 2021-11-15 | Disposition: A | Discharge status: Home / Self Care | Payer: Medicare PPO | Source: Ambulatory Visit | Attending: General Surgery | Admitting: General Surgery

## 2021-11-15 DIAGNOSIS — E119 Type 2 diabetes mellitus without complications: Secondary | ICD-10-CM | POA: Diagnosis not present

## 2021-11-15 DIAGNOSIS — Z01818 Encounter for other preprocedural examination: Secondary | ICD-10-CM | POA: Diagnosis present

## 2021-11-15 DIAGNOSIS — Z794 Long term (current) use of insulin: Secondary | ICD-10-CM | POA: Diagnosis not present

## 2021-11-15 LAB — BASIC METABOLIC PANEL
Anion gap: 9 (ref 5–15)
BUN: 11 mg/dL (ref 8–23)
CO2: 26 mmol/L (ref 22–32)
Calcium: 9.1 mg/dL (ref 8.9–10.3)
Chloride: 103 mmol/L (ref 98–111)
Creatinine, Ser: 0.95 mg/dL (ref 0.44–1.00)
GFR, Estimated: >60 mL/min (ref >60)
Glucose, Bld: 287 mg/dL — ABNORMAL HIGH (ref 70–99)
Potassium: 4 mmol/L (ref 3.5–5.1)
Sodium: 138 mmol/L (ref 135–145)

## 2021-11-15 MED ORDER — CHLORHEXIDINE GLUCONATE CLOTH 2 % EX PADS: 6.0000 | MEDICATED_PAD | Freq: Once | CUTANEOUS | Status: DC

## 2021-11-15 NOTE — Progress Notes (Signed)

## 2021-11-18 NOTE — Progress Notes (Signed)
Reviewed glucose result (287) with Dr Sabra Heck who said recheck CBG AM of surgery

## 2021-11-19 ENCOUNTER — Other Ambulatory Visit: Payer: Self-pay

## 2021-11-19 ENCOUNTER — Ambulatory Visit (HOSPITAL_BASED_OUTPATIENT_CLINIC_OR_DEPARTMENT_OTHER): Payer: Medicare PPO | Admitting: Anesthesiology

## 2021-11-19 ENCOUNTER — Encounter (HOSPITAL_BASED_OUTPATIENT_CLINIC_OR_DEPARTMENT_OTHER): Payer: Self-pay | Admitting: General Surgery

## 2021-11-19 ENCOUNTER — Ambulatory Visit (HOSPITAL_BASED_OUTPATIENT_CLINIC_OR_DEPARTMENT_OTHER)
Admission: RE | Admit: 2021-11-19 | Discharge: 2021-11-19 | Disposition: A | Discharge status: Home / Self Care | Payer: Medicare PPO | Source: Ambulatory Visit | Attending: General Surgery | Admitting: General Surgery

## 2021-11-19 ENCOUNTER — Encounter (HOSPITAL_BASED_OUTPATIENT_CLINIC_OR_DEPARTMENT_OTHER)
Admission: RE | Disposition: A | Discharge status: Home / Self Care | Payer: Self-pay | Source: Ambulatory Visit | Attending: General Surgery

## 2021-11-19 DIAGNOSIS — Z7984 Long term (current) use of oral hypoglycemic drugs: Secondary | ICD-10-CM | POA: Insufficient documentation

## 2021-11-19 DIAGNOSIS — G4730 Sleep apnea, unspecified: Secondary | ICD-10-CM | POA: Insufficient documentation

## 2021-11-19 DIAGNOSIS — Z87891 Personal history of nicotine dependence: Secondary | ICD-10-CM

## 2021-11-19 DIAGNOSIS — E119 Type 2 diabetes mellitus without complications: Secondary | ICD-10-CM | POA: Diagnosis not present

## 2021-11-19 DIAGNOSIS — Z794 Long term (current) use of insulin: Secondary | ICD-10-CM | POA: Diagnosis not present

## 2021-11-19 DIAGNOSIS — Z6841 Body Mass Index (BMI) 40.0 and over, adult: Secondary | ICD-10-CM | POA: Diagnosis not present

## 2021-11-19 DIAGNOSIS — D171 Benign lipomatous neoplasm of skin and subcutaneous tissue of trunk: Secondary | ICD-10-CM | POA: Diagnosis present

## 2021-11-19 DIAGNOSIS — Z79899 Other long term (current) drug therapy: Secondary | ICD-10-CM | POA: Diagnosis not present

## 2021-11-19 DIAGNOSIS — K449 Diaphragmatic hernia without obstruction or gangrene: Secondary | ICD-10-CM | POA: Insufficient documentation

## 2021-11-19 DIAGNOSIS — I10 Essential (primary) hypertension: Secondary | ICD-10-CM | POA: Diagnosis not present

## 2021-11-19 HISTORY — DX: Hyperlipidemia, unspecified: E78.5

## 2021-11-19 HISTORY — DX: Benign lipomatous neoplasm of skin and subcutaneous tissue of trunk: D17.1

## 2021-11-19 HISTORY — PX: LIPOMA EXCISION: SHX5283

## 2021-11-19 LAB — GLUCOSE, CAPILLARY
Glucose-Capillary: 176 mg/dL — ABNORMAL HIGH (ref 70–99)
Glucose-Capillary: 121 mg/dL — ABNORMAL HIGH (ref 70–99)

## 2021-11-19 SURGERY — EXCISION LIPOMA
Anesthesia: General | Site: Buttocks | Laterality: Left

## 2021-11-19 MED ORDER — LIDOCAINE 2% (20 MG/ML) 5 ML SYRINGE
INTRAMUSCULAR | Status: AC
  Filled 2021-11-19: qty 5

## 2021-11-19 MED ORDER — KETOROLAC TROMETHAMINE 30 MG/ML IJ SOLN
INTRAMUSCULAR | Status: DC | PRN
  Administered 2021-11-19: 30 mg via INTRAVENOUS

## 2021-11-19 MED ORDER — LACTATED RINGERS IV SOLN: INTRAVENOUS | Status: DC

## 2021-11-19 MED ORDER — ONDANSETRON HCL 4 MG/2ML IJ SOLN
INTRAMUSCULAR | Status: AC
  Filled 2021-11-19: qty 2

## 2021-11-19 MED ORDER — OXYCODONE HCL 5 MG/5ML PO SOLN: 5.0000 mg | Freq: Once | ORAL | Status: DC | PRN

## 2021-11-19 MED ORDER — DEXAMETHASONE SODIUM PHOSPHATE 10 MG/ML IJ SOLN
INTRAMUSCULAR | Status: AC
  Filled 2021-11-19: qty 1

## 2021-11-19 MED ORDER — ONDANSETRON HCL 4 MG/2ML IJ SOLN
INTRAMUSCULAR | Status: DC | PRN
  Administered 2021-11-19: 4 mg via INTRAVENOUS

## 2021-11-19 MED ORDER — CEFAZOLIN IN SODIUM CHLORIDE 3-0.9 GM/100ML-% IV SOLN
3.0000 g | INTRAVENOUS | Status: AC
  Administered 2021-11-19: 3 g via INTRAVENOUS

## 2021-11-19 MED ORDER — FENTANYL CITRATE (PF) 100 MCG/2ML IJ SOLN: 25.0000 ug | INTRAMUSCULAR | Status: DC | PRN

## 2021-11-19 MED ORDER — FENTANYL CITRATE (PF) 100 MCG/2ML IJ SOLN
INTRAMUSCULAR | Status: AC
  Filled 2021-11-19: qty 2

## 2021-11-19 MED ORDER — OXYCODONE HCL 5 MG PO TABS: 5.0000 mg | ORAL_TABLET | Freq: Four times a day (QID) | ORAL | 0 refills | Status: DC | PRN

## 2021-11-19 MED ORDER — ACETAMINOPHEN 500 MG PO TABS
1000.0000 mg | ORAL_TABLET | Freq: Once | ORAL | Status: AC
  Administered 2021-11-19: 1000 mg via ORAL

## 2021-11-19 MED ORDER — BUPIVACAINE HCL (PF) 0.25 % IJ SOLN
INTRAMUSCULAR | Status: AC
  Filled 2021-11-19: qty 30

## 2021-11-19 MED ORDER — PROPOFOL 10 MG/ML IV BOLUS
INTRAVENOUS | Status: DC | PRN
  Administered 2021-11-19: 150 mg via INTRAVENOUS

## 2021-11-19 MED ORDER — FENTANYL CITRATE (PF) 100 MCG/2ML IJ SOLN
INTRAMUSCULAR | Status: DC | PRN
Start: 2021-11-19 — End: 2021-11-19
  Administered 2021-11-19: 50 ug via INTRAVENOUS

## 2021-11-19 MED ORDER — LIDOCAINE HCL (CARDIAC) PF 100 MG/5ML IV SOSY
PREFILLED_SYRINGE | INTRAVENOUS | Status: DC | PRN
  Administered 2021-11-19: 100 mg via INTRAVENOUS

## 2021-11-19 MED ORDER — ACETAMINOPHEN 500 MG PO TABS: 1000.0000 mg | ORAL_TABLET | Freq: Three times a day (TID) | ORAL | 0 refills | Status: AC

## 2021-11-19 MED ORDER — DEXAMETHASONE SODIUM PHOSPHATE 4 MG/ML IJ SOLN
INTRAMUSCULAR | Status: DC | PRN
  Administered 2021-11-19: 4 mg via INTRAVENOUS

## 2021-11-19 MED ORDER — MIDAZOLAM HCL 5 MG/5ML IJ SOLN
INTRAMUSCULAR | Status: DC | PRN
  Administered 2021-11-19: 2 mg via INTRAVENOUS

## 2021-11-19 MED ORDER — BUPIVACAINE-EPINEPHRINE (PF) 0.5% -1:200000 IJ SOLN
INTRAMUSCULAR | Status: DC | PRN
  Administered 2021-11-19: 20 mL via PERINEURAL

## 2021-11-19 MED ORDER — MIDAZOLAM HCL 2 MG/2ML IJ SOLN
INTRAMUSCULAR | Status: AC
  Filled 2021-11-19: qty 2

## 2021-11-19 MED ORDER — ACETAMINOPHEN 500 MG PO TABS: 1000.0000 mg | ORAL_TABLET | ORAL | Status: AC

## 2021-11-19 MED ORDER — AMISULPRIDE (ANTIEMETIC) 5 MG/2ML IV SOLN: 10.0000 mg | Freq: Once | INTRAVENOUS | Status: DC | PRN

## 2021-11-19 MED ORDER — ACETAMINOPHEN 500 MG PO TABS
ORAL_TABLET | ORAL | Status: AC
  Filled 2021-11-19: qty 2

## 2021-11-19 MED ORDER — PROPOFOL 10 MG/ML IV BOLUS
INTRAVENOUS | Status: AC
Start: 2021-11-19 — End: ?
  Filled 2021-11-19: qty 20

## 2021-11-19 MED ORDER — OXYCODONE HCL 5 MG PO TABS: 5.0000 mg | ORAL_TABLET | Freq: Once | ORAL | Status: DC | PRN

## 2021-11-19 MED ORDER — ONDANSETRON HCL 4 MG/2ML IJ SOLN: 4.0000 mg | Freq: Once | INTRAMUSCULAR | Status: DC | PRN

## 2021-11-19 MED ORDER — CEFAZOLIN IN SODIUM CHLORIDE 3-0.9 GM/100ML-% IV SOLN
INTRAVENOUS | Status: AC
  Filled 2021-11-19: qty 100

## 2021-11-19 MED ORDER — BUPIVACAINE-EPINEPHRINE (PF) 0.5% -1:200000 IJ SOLN
INTRAMUSCULAR | Status: AC
  Filled 2021-11-19: qty 30

## 2021-11-19 SURGICAL SUPPLY — 54 items
ADH SKN CLS APL DERMABOND .7 (GAUZE/BANDAGES/DRESSINGS) ×1
APL PRP STRL LF DISP 70% ISPRP (MISCELLANEOUS) ×1
APL SKNCLS STERI-STRIP NONHPOA (GAUZE/BANDAGES/DRESSINGS)
BENZOIN TINCTURE PRP APPL 2/3 (GAUZE/BANDAGES/DRESSINGS) IMPLANT
BLADE CLIPPER SURG (BLADE) IMPLANT
BLADE HEX COATED 2.75 (ELECTRODE) IMPLANT
BLADE SURG 15 STRL LF DISP TIS (BLADE) ×2 IMPLANT
BLADE SURG 15 STRL SS (BLADE) ×2
CANISTER SUCT 1200ML W/VALVE (MISCELLANEOUS) IMPLANT
CHLORAPREP W/TINT 26 (MISCELLANEOUS) ×3 IMPLANT
COVER BACK TABLE 60X90IN (DRAPES) ×3 IMPLANT
COVER MAYO STAND STRL (DRAPES) ×3 IMPLANT
DERMABOND ADVANCED (GAUZE/BANDAGES/DRESSINGS) ×1
DERMABOND ADVANCED .7 DNX12 (GAUZE/BANDAGES/DRESSINGS) ×2 IMPLANT
DRAPE LAPAROTOMY 100X72 PEDS (DRAPES) ×3 IMPLANT
DRAPE UTILITY XL STRL (DRAPES) ×3 IMPLANT
DRSG TEGADERM 4X4.75 (GAUZE/BANDAGES/DRESSINGS) IMPLANT
ELECT COATED BLADE 2.86 ST (ELECTRODE) IMPLANT
ELECT REM PT RETURN 9FT ADLT (ELECTROSURGICAL) ×2
ELECTRODE REM PT RTRN 9FT ADLT (ELECTROSURGICAL) ×2 IMPLANT
GAUZE PACKING IODOFORM 1/4X15 (PACKING) IMPLANT
GAUZE SPONGE 4X4 12PLY STRL (GAUZE/BANDAGES/DRESSINGS) IMPLANT
GAUZE SPONGE 4X4 12PLY STRL LF (GAUZE/BANDAGES/DRESSINGS) IMPLANT
GLOVE BIO SURGEON STRL SZ7.5 (GLOVE) ×3 IMPLANT
GLOVE BIOGEL PI IND STRL 8 (GLOVE) ×2 IMPLANT
GLOVE BIOGEL PI INDICATOR 8 (GLOVE) ×1
GOWN STRL REUS W/ TWL LRG LVL3 (GOWN DISPOSABLE) ×2 IMPLANT
GOWN STRL REUS W/ TWL XL LVL3 (GOWN DISPOSABLE) ×2 IMPLANT
GOWN STRL REUS W/TWL LRG LVL3 (GOWN DISPOSABLE) ×2
GOWN STRL REUS W/TWL XL LVL3 (GOWN DISPOSABLE) ×2
NDL HYPO 25X1 1.5 SAFETY (NEEDLE) ×2 IMPLANT
NEEDLE HYPO 25X1 1.5 SAFETY (NEEDLE) ×2 IMPLANT
NS IRRIG 1000ML POUR BTL (IV SOLUTION) IMPLANT
PACK BASIN DAY SURGERY FS (CUSTOM PROCEDURE TRAY) ×3 IMPLANT
PENCIL SMOKE EVACUATOR (MISCELLANEOUS) ×3 IMPLANT
SPIKE FLUID TRANSFER (MISCELLANEOUS) IMPLANT
STRIP CLOSURE SKIN 1/2X4 (GAUZE/BANDAGES/DRESSINGS) IMPLANT
SUT ETHILON 2 0 FS 18 (SUTURE) IMPLANT
SUT ETHILON 3 0 PS 1 (SUTURE) ×1 IMPLANT
SUT ETHILON 4 0 PS 2 18 (SUTURE) IMPLANT
SUT MNCRL AB 4-0 PS2 18 (SUTURE) ×3 IMPLANT
SUT SILK 2 0 SH (SUTURE) IMPLANT
SUT VIC AB 2-0 SH 27 (SUTURE)
SUT VIC AB 2-0 SH 27XBRD (SUTURE) IMPLANT
SUT VIC AB 4-0 SH 18 (SUTURE) IMPLANT
SUT VICRYL 3-0 CR8 SH (SUTURE) ×1 IMPLANT
SUT VICRYL 4-0 PS2 18IN ABS (SUTURE) IMPLANT
SWAB COLLECTION DEVICE MRSA (MISCELLANEOUS) IMPLANT
SWAB CULTURE ESWAB REG 1ML (MISCELLANEOUS) IMPLANT
SYR CONTROL 10ML LL (SYRINGE) ×3 IMPLANT
TOWEL GREEN STERILE FF (TOWEL DISPOSABLE) ×3 IMPLANT
TUBE CONNECTING 20X1/4 (TUBING) IMPLANT
UNDERPAD 30X36 HEAVY ABSORB (UNDERPADS AND DIAPERS) IMPLANT
YANKAUER SUCT BULB TIP NO VENT (SUCTIONS) IMPLANT

## 2021-11-19 NOTE — Anesthesia Preprocedure Evaluation (Signed)
Anesthesia Evaluation  Patient identified by MRN, date of birth, ID band Patient awake    Reviewed: Allergy & Precautions, NPO status , Patient's Chart, lab work & pertinent test results  History of Anesthesia Complications Negative for: history of anesthetic complications  Airway Mallampati: III  TM Distance: >3 FB Neck ROM: Full    Dental  (+) Teeth Intact, Dental Advisory Given   Pulmonary sleep apnea , former smoker,    Pulmonary exam normal        Cardiovascular hypertension, Pt. on medications Normal cardiovascular exam     Neuro/Psych negative neurological ROS     GI/Hepatic Neg liver ROS, hiatal hernia,   Endo/Other  diabetes, Insulin DependentMorbid obesity  Renal/GU negative Renal ROS  negative genitourinary   Musculoskeletal negative musculoskeletal ROS (+)   Abdominal   Peds  Hematology negative hematology ROS (+)   Anesthesia Other Findings Left buttock lipoma  Reproductive/Obstetrics                            Anesthesia Physical Anesthesia Plan  ASA: 3  Anesthesia Plan: General   Post-op Pain Management: Tylenol PO (pre-op)* and Toradol IV (intra-op)*   Induction: Intravenous  PONV Risk Score and Plan: 3 and Ondansetron, Dexamethasone, Treatment may vary due to age or medical condition and Midazolam  Airway Management Planned: Oral ETT  Additional Equipment: None  Intra-op Plan:   Post-operative Plan: Extubation in OR  Informed Consent: I have reviewed the patients History and Physical, chart, labs and discussed the procedure including the risks, benefits and alternatives for the proposed anesthesia with the patient or authorized representative who has indicated his/her understanding and acceptance.     Dental advisory given  Plan Discussed with:   Anesthesia Plan Comments:         Anesthesia Quick Evaluation

## 2021-11-19 NOTE — Interval H&P Note (Signed)
History and Physical Interval Note:  11/19/2021 2:14 PM  [name]  has presented today for surgery, with the diagnosis of LEFT BUTTOCK LIPOMA.  The various methods of treatment have been discussed with the patient and family. After consideration of risks, benefits and other options for treatment, the patient has consented to  Procedure(s): EXCISION OF LEFT UPPER BUTTOCK LIPOMA (Left) as a surgical intervention.  The patient's history has been reviewed, patient examined, no change in status, stable for surgery.  I have reviewed the patient's chart and labs.  Questions were answered to the patient's satisfaction.     Greer Pickerel

## 2021-11-19 NOTE — Op Note (Signed)
11/19/2021  3:19 PM  PATIENT:  [name]  76 y.o. female  PRE-OPERATIVE DIAGNOSIS:  LEFT BUTTOCK LIPOMA  POST-OPERATIVE DIAGNOSIS:  LEFT BUTTOCK LIPOMA  PROCEDURE:  Procedure(s): EXCISION OF LEFT UPPER BUTTOCK LIPOMA (4x4cm)  SURGEON:  Surgeon(s): Greer Pickerel, MD  ASSISTANTS: none   ANESTHESIA:    general LMA  DRAINS: none   LOCAL MEDICATIONS USED:  MARCAINE     SPECIMEN:  Source of Specimen:  lipoma  DISPOSITION OF SPECIMEN:  PATHOLOGY  COUNTS:  YES  INDICATION FOR PROCEDURE: Patient is a 76 year old female with severe obesity, diabetes who had a palpable lump on her left upper lateral buttock.  She had imaging because of the palpable lump that revealed a 4-1/2 x 4 cm soft tissue mass consistent with a lipoma.  It was causing discomfort with certain positions and movements.  She desires surgical excision.  Please see outside history and physical for additional information  PROCEDURE: After confirming the area with the patient in the holding room and marking and outlining it with my initials she was taken the OR 1 at [facility] and placed supine on the operating room table on a beanbag.  General LMA anesthesia was established.  Sequential compression devices were placed.  She was then placed in the right lateral position with the appropriate padding.  An axillary roll was placed.  She was secured to the bed for safety.  Her left lower back and upper buttock area were then prepped and draped in the usual standard surgical fashion with ChloraPrep.  She received IV antibiotic.  A surgical timeout was performed.  I drew a line with a marking pen overlying the palpable mass.  Local was then infiltrated.  I then made a 4 cm skin incision with a 15 blade and divided the deep dermis.  Subcutaneous tissue was divided with electrocautery.  A wheat Lander retractor was placed.  I then excised the lipoma in its entirety.  They did come out in a few pieces but there was one  larger piece.  I then palpated and visually inspected the wound and there is no other evidence of lipoma.  Hemostasis was achieved with electrocautery.  Additional local was infiltrated.  I reapproximated the deep dermis with multiple inverted interrupted 3-0 Vicryl sutures and the skin was reapproximated with 5 interrupted 3-0 nylon sutures followed by placement of a dry gauze and tape.  All needle, instrument, and sponge counts were correct x2.  Patient was placed back in the supine position and awakened and taken to the recovery room in stable condition.  There were no immediate complications.  PLAN OF CARE: Discharge to home after PACU  PATIENT DISPOSITION:  PACU - hemodynamically stable.   Delay start of Pharmacological VTE agent (>24hrs) due to surgical blood loss or risk of bleeding:  not applicable  Leighton Ruff. Redmond Pulling, MD, FACS General, Bariatric, & Minimally Invasive Surgery [facility], [place]

## 2021-11-19 NOTE — Anesthesia Postprocedure Evaluation (Signed)
Anesthesia Post Note  Patient: NASEEM ADLER  Procedure(s) Performed: EXCISION OF LEFT UPPER BUTTOCK LIPOMA (Left: Buttocks)     Patient location during evaluation: PACU Anesthesia Type: General Level of consciousness: awake and alert Pain management: pain level controlled Vital Signs Assessment: post-procedure vital signs reviewed and stable Respiratory status: spontaneous breathing, nonlabored ventilation and respiratory function stable Cardiovascular status: blood pressure returned to baseline and stable Postop Assessment: no apparent nausea or vomiting Anesthetic complications: no   No notable events documented.  Last Vitals:  Vitals:   11/19/21 1610 11/19/21 1624  BP:  134/82  Pulse: (!) 55 (!) 55  Resp: 15 16  Temp:  36.6 C  SpO2: 97% 98%    Last Pain:  Vitals:   11/19/21 1624  TempSrc: Oral  PainSc: 0-No pain                 Lidia Collum

## 2021-11-19 NOTE — Transfer of Care (Signed)
Immediate Anesthesia Transfer of Care Note  Patient: [name]  Procedure(s) Performed: EXCISION OF LEFT UPPER BUTTOCK LIPOMA (Left: Buttocks)  Patient Location: PACU  Anesthesia Type:General  Level of Consciousness: sedated  Airway & Oxygen Therapy: Patient Spontanous Breathing and Patient connected to face mask oxygen  Post-op Assessment: Report given to RN and Post -op Vital signs reviewed and stable  Post vital signs: Reviewed and stable  Last Vitals:  Vitals Value Taken Time  BP 120/77 11/19/21 1525  Temp    Pulse 65 11/19/21 1525  Resp 12 11/19/21 1525  SpO2 99 % 11/19/21 1525  Vitals shown include unvalidated device data.  Last Pain:  Vitals:   11/19/21 1309  TempSrc: Oral  PainSc: 7       Patients Stated Pain Goal: 4 (98/47/30 8569)  Complications: No notable events documented.

## 2021-11-19 NOTE — Anesthesia Procedure Notes (Signed)
Procedure Name: LMA Insertion Date/Time: 11/19/2021 2:51 PM  Performed by: Tawni Millers, CRNAPre-anesthesia Checklist: Patient identified, Emergency Drugs available, Suction available and Patient being monitored Patient Re-evaluated:Patient Re-evaluated prior to induction Oxygen Delivery Method: Circle system utilized Preoxygenation: Pre-oxygenation with 100% oxygen Induction Type: IV induction Ventilation: Mask ventilation without difficulty LMA: LMA inserted LMA Size: 4.0 Number of attempts: 1 Airway Equipment and Method: Bite block Placement Confirmation: positive ETCO2 Tube secured with: Tape Dental Injury: Teeth and Oropharynx as per pre-operative assessment

## 2021-11-19 NOTE — H&P (Signed)
REFERRING PHYSICIAN: Duke Salvia, MD  PROVIDER: Cleto Claggett Leanne Chang, MD  MRN: [id number] DOB: 17-Apr-1945 DATE OF ENCOUNTER: 10/24/2021  Subjective   Chief Complaint: New Patient (Large lipoma lower back )   History of Present Illness: [name] is a 76 y.o. female who is seen today as an office consultation at the request of Dr. Reece Levy for evaluation of New Patient (Large lipoma lower back ) .   She states that after coming back from celebrating a family function in [place] she noticed a lump in her upper right buttock. Is about the size of a walnut but it has grown over time. She states that it feels like it spreading. It causes local discomfort especially when sitting. No nausea or vomiting. No weight change other than a few pounds due to her recent diabetes medication change. No lymphadenopathy. No family history of soft tissue malignancy. No drainage or infection in the area. No prior blood clots.    Review of Systems: A complete review of systems was obtained from the patient. I have reviewed this information and discussed as appropriate with the patient. See HPI as well for other ROS.  ROS   Medical History: Past Medical History:  Diagnosis Date  Arthritis  Hyperlipidemia  Hypertension   Patient Active Problem List  Diagnosis  Morbid obesity (CMS-HCC)   History reviewed. No pertinent surgical history.   No Known Allergies  Current Outpatient Medications on File Prior to Visit  Medication Sig Dispense Refill  buPROPion (WELLBUTRIN SR) 100 MG SR tablet  chlorthalidone 25 MG tablet Take 1 tablet by mouth once daily  glipiZIDE (GLUCOTROL) 5 MG tablet Take 5 mg by mouth every morning before breakfast (0630)  insulin GLARGINE (LANTUS SOLOSTAR) pen injector (concentration 100 units/mL) Inject subcutaneously  lisinopriL (ZESTRIL) 10 MG tablet Take 10 mg by mouth once daily  meloxicam (MOBIC) 15 MG tablet   No current facility-administered medications on file  prior to visit.   Family History  Problem Relation Age of Onset  Hyperlipidemia (Elevated cholesterol) Brother    Social History   Tobacco Use  Smoking Status Former  Types: Cigarettes  Quit date: 1993  Years since quitting: 30.5  Smokeless Tobacco Never    Social History   Socioeconomic History  Marital status: Legally Separated  Tobacco Use  Smoking status: Former  Types: Cigarettes  Quit date: 1993  Years since quitting: 30.5  Smokeless tobacco: Never  Substance and Sexual Activity  Alcohol use: Yes  Drug use: Never   Objective:   Vitals:  10/24/21 1602  BP: 128/78  Pulse: 84  Temp: 36.6 C (97.8 F)  SpO2: 96%  Weight: (!) 127.1 kg (280 lb 3.2 oz)  Height: 170.2 cm (_0 )   Body mass index is 43.89 kg/m.  Constitutional: NAD; conversant; no deformities; severe obesity Eyes: Moist conjunctiva; no lid lag; anicteric; PERRL Neck: Trachea midline; no thyromegaly Lungs: Normal respiratory effort; no tactile fremitus CV: RRR; no palpable thrills; no pitting edema GI: Abd soft, nontender, nondistended; no palpable hepatosplenomegaly MSK: Normal gait slight limp; no clubbing/cyanosis Psychiatric: Appropriate affect; alert and oriented x3 Lymphatic: No palpable cervical or axillary lymphadenopathy Skin: Chaperone present-on her left upper buttock there is a well-circumscribed mobile soft tissue mass with no overlying skin changes. There is about 4-1/2 cm x 4 cm   Labs, Imaging and Diagnostic Testing: Reviewed referring provider's office note.   the patient had an ultrasound in October 2022 which showed a 4.4 x 3.4 x 1.7 cm  subcutaneous mass in the right lower back consistent with a lipoma  C-Met from August 2022 was unremarkable  Assessment and Plan:    Diagnoses and all orders for this visit:  Lipoma of back  Morbid obesity (CMS-HCC)  Diabetes mellitus type 2 in obese (CMS-HCC)   Even though her ultrasound says right lower back the patient  points to her left upper buttock and I agree with the anatomical location.  We discussed the etiology and management of lipomas. The patient was given educational material. We discussed that the majority of lipomas are benign although on a rare occasion it can be malignant.   We discussed observation versus surgical excision. We discussed the risks and benefits of surgery including but not limited to bleeding, infection, injury to surrounding structures, scarring, cosmetic concerns, seroma/hematoma, blood clot formation, anesthesia issues, possible recurrence, and the typical postoperative course.  We discussed that she is at slight increased risk for infection due to her diabetes.  We will plan to do excision of left upper buttock lipoma under MAC  This patient encounter took 35 minutes today to perform the following: take history, perform exam, review outside records, interpret imaging, counsel the patient on their diagnosis and document encounter, findings & plan in the EHR  No follow-ups on file.  Korin Setzler Leanne Chang, MD  General, Minimally Invasive, & Bariatric Surgery     Electronically signed by Rudean Curt, MD at 10/24/2021 4:23 PM EDT

## 2021-11-19 NOTE — Discharge Instructions (Addendum)
[facility]  POST OP INSTRUCTIONS  Always review your discharge instruction sheet given to you by the facility where your surgery was performed. IF YOU HAVE DISABILITY OR FAMILY LEAVE FORMS, YOU MUST BRING THEM TO THE OFFICE FOR PROCESSING.   DO NOT GIVE THEM TO YOUR DOCTOR.  A  prescription for pain medication may be given to you upon discharge.  Take your pain medication as prescribed, if needed.  If narcotic pain medicine is not needed, then you may take acetaminophen (Tylenol) or ibuprofen (Advil) as needed. Take your usually prescribed medications unless otherwise directed. If you need a refill on your pain medication, please contact your pharmacy.  They will contact our office to request authorization. Prescriptions will not be filled after 5 pm or on week-ends. You should follow a light diet the first 24 hours after arrival home, such as soup and crackers, etc.  Be sure to include lots of fluids daily.  Resume your normal diet the day after surgery. Most patients will experience some swelling and bruising around the incision.  Ice packs and reclining will help.  Swelling and bruising can take several days to resolve.  It is common to experience some constipation if taking pain medication after surgery.  Increasing fluid intake and taking a stool softener (such as Colace) will usually help or prevent this problem from occurring.  A mild laxative (Milk of Magnesia or Miralax) should be taken according to package directions if there are no bowel movements after 48 hours. Unless discharge instructions indicate otherwise, you may remove your bandages 48 hours after surgery, and you may shower at that time.  Your sutures will be removed by the nurse in the office in 12-14 days.  You can cover the incision with dry gauze and change daily and/or as needed.   Any sutures or staples will be removed at the office during your follow-up visit. ACTIVITIES:  You may resume regular (light)  daily activities beginning the next day--such as daily self-care, walking, climbing stairs--gradually increasing activities as tolerated.  You may have sexual intercourse when it is comfortable.  Refrain from any heavy lifting or straining until approved by your doctor. You may drive when you are no longer taking prescription pain medication, you can comfortably wear a seatbelt, and you can safely maneuver your car and apply brakes. RETURN TO WORK:  You should see your doctor in the office for a follow-up appointment approximately 2-3 weeks after your surgery.  Make sure that you call for this appointment within a day or two after you arrive home to insure a convenient appointment time. OTHER INSTRUCTIONS:     WHEN TO CALL YOUR DOCTOR: Fever over 101.0 Persistent Nausea and/or vomiting Extreme swelling or bruising Continued bleeding from incision. Increased pain, redness, or drainage from the incision  The clinic staff is available to answer your questions during regular business hours.  Please don't hesitate to call and ask to speak to one of the nurses for clinical concerns.  If you have a medical emergency, go to the nearest emergency room or call 911.  A surgeon from [facility] is always on call at the hospital   [address], [place], [place], [place]  [address] ?  [address], [place], [place]   [address] [phone] ? [phone] ? FAX [phone] Web site: [email or web address]     Managing Your Pain After Surgery Without Opioids    Thank you for participating in our program to  help patients manage their pain after surgery without opioids. This is part of our effort to provide you with the best care possible, without exposing you or your family to the risk that opioids pose.  What pain can I expect after surgery? You can expect to have some pain after surgery. This is normal. The pain is typically worse the day after surgery, and quickly  begins to get better. Many studies have found that many patients are able to manage their pain after surgery with Over-the-Counter (OTC) medications such as Tylenol and Motrin. If you have a condition that does not allow you to take Tylenol or Motrin, notify your surgical team.  How will I manage my pain? The best strategy for controlling your pain after surgery is around the clock pain control with Tylenol (acetaminophen) and Motrin (ibuprofen or Advil). Alternating these medications with each other allows you to maximize your pain control. In addition to Tylenol and Motrin, you can use heating pads or ice packs on your incisions to help reduce your pain.  How will I alternate your regular strength over-the-counter pain medication? You will take a dose of pain medication every three hours. Start by taking 650 mg of Tylenol (2 pills of 325 mg) 3 hours later take 600 mg of Motrin (3 pills of 200 mg) 3 hours after taking the Motrin take 650 mg of Tylenol 3 hours after that take 600 mg of Motrin.   - 1 -  See example - if your first dose of Tylenol is at 12:00 PM   12:00 PM Tylenol 650 mg (2 pills of 325 mg)  3:00 PM Motrin 600 mg (3 pills of 200 mg)  6:00 PM Tylenol 650 mg (2 pills of 325 mg)  9:00 PM Motrin 600 mg (3 pills of 200 mg)  Continue alternating every 3 hours   We recommend that you follow this schedule around-the-clock for at least 3 days after surgery, or until you feel that it is no longer needed. Use the table on the last page of this handout to keep track of the medications you are taking. Important: Do not take more than '3000mg'$  of Tylenol or '1600mg'$  of Motrin in a 24-hour period. Do not take ibuprofen/Motrin if you have a history of bleeding stomach ulcers, severe kidney disease, &/or actively taking a blood thinner  What if I still have pain? If you have pain that is not controlled with the over-the-counter pain medications (Tylenol and Motrin or Advil) you might have  what we call "breakthrough" pain. You will receive a prescription for a small amount of an opioid pain medication such as Oxycodone, Tramadol, or Tylenol with Codeine. Use these opioid pills in the first 24 hours after surgery if you have breakthrough pain. Do not take more than 1 pill every 4-6 hours.  If you still have uncontrolled pain after using all opioid pills, don't hesitate to call our staff using the number provided. We will help make sure you are managing your pain in the best way possible, and if necessary, we can provide a prescription for additional pain medication.   Day 1    Time  Name of Medication Number of pills taken  Amount of Acetaminophen  Pain Level   Comments  AM PM       AM PM       AM PM       AM PM       AM PM       AM  PM       AM PM       AM PM       Total Daily amount of Acetaminophen Do not take more than  3,000 mg per day      Day 2    Time  Name of Medication Number of pills taken  Amount of Acetaminophen  Pain Level   Comments  AM PM       AM PM       AM PM       AM PM       AM PM       AM PM       AM PM       AM PM       Total Daily amount of Acetaminophen Do not take more than  3,000 mg per day      Day 3    Time  Name of Medication Number of pills taken  Amount of Acetaminophen  Pain Level   Comments  AM PM       AM PM       AM PM       AM PM          AM PM       AM PM       AM PM       AM PM       Total Daily amount of Acetaminophen Do not take more than  3,000 mg per day      Day 4    Time  Name of Medication Number of pills taken  Amount of Acetaminophen  Pain Level   Comments  AM PM       AM PM       AM PM       AM PM       AM PM       AM PM       AM PM       AM PM       Total Daily amount of Acetaminophen Do not take more than  3,000 mg per day      Day 5    Time  Name of Medication Number of pills taken  Amount of Acetaminophen  Pain Level   Comments  AM PM       AM PM        AM PM       AM PM       AM PM       AM PM       AM PM       AM PM       Total Daily amount of Acetaminophen Do not take more than  3,000 mg per day       Day 6    Time  Name of Medication Number of pills taken  Amount of Acetaminophen  Pain Level  Comments  AM PM       AM PM       AM PM       AM PM       AM PM       AM PM       AM PM       AM PM       Total Daily amount of Acetaminophen Do not take more than  3,000 mg per day      Day 7    Time  Name  of Medication Number of pills taken  Amount of Acetaminophen  Pain Level   Comments  AM PM       AM PM       AM PM       AM PM       AM PM       AM PM       AM PM       AM PM       Total Daily amount of Acetaminophen Do not take more than  3,000 mg per day        For additional information about how and where to safely dispose of unused opioid medications - [employer]  Disclaimer: This document contains information and/or instructional materials adapted from [facility] for the typical patient with your condition. It does not replace medical advice from your health care provider because your experience may differ from that of the typical patient. Talk to your health care provider if you have any questions about this document, your condition or your treatment plan. Adapted from [facility] Instructions  Activity: Get plenty of rest for the remainder of the day. A responsible individual must stay with you for 24 hours following the procedure.  For the next 24 hours, DO NOT: -Drive a car -Paediatric nurse -Drink alcoholic beverages -Take any medication unless instructed by your physician -Make any legal decisions or sign important papers.  Meals: Start with liquid foods such as gelatin or soup. Progress to regular foods as tolerated. Avoid greasy, spicy, heavy foods. If nausea and/or vomiting occur, drink only clear liquids until the  nausea and/or vomiting subsides. Call your physician if vomiting continues.  Special Instructions/Symptoms: Your throat may feel dry or sore from the anesthesia or the breathing tube placed in your throat during surgery. If this causes discomfort, gargle with warm salt water. The discomfort should disappear within 24 hours.  May have Tylenol today at 7:20pm 11/19/21 May have [place] today at 9pm 11/19/21

## 2021-11-20 ENCOUNTER — Encounter (HOSPITAL_BASED_OUTPATIENT_CLINIC_OR_DEPARTMENT_OTHER): Payer: Self-pay | Admitting: General Surgery

## 2021-11-20 LAB — SURGICAL PATHOLOGY

## 2021-12-11 ENCOUNTER — Other Ambulatory Visit (HOSPITAL_COMMUNITY): Payer: Self-pay | Admitting: Registered Nurse

## 2021-12-11 DIAGNOSIS — E2839 Other primary ovarian failure: Secondary | ICD-10-CM

## 2022-01-02 ENCOUNTER — Ambulatory Visit (INDEPENDENT_AMBULATORY_CARE_PROVIDER_SITE_OTHER): Payer: Medicare PPO

## 2022-01-02 ENCOUNTER — Ambulatory Visit: Payer: Medicare PPO | Admitting: Orthopedic Surgery

## 2022-01-02 DIAGNOSIS — M25512 Pain in left shoulder: Secondary | ICD-10-CM

## 2022-01-04 ENCOUNTER — Encounter: Payer: Self-pay | Admitting: Orthopedic Surgery

## 2022-01-04 NOTE — Progress Notes (Signed)
Office Visit Note   Patient: [name]           Date of Birth: 15-Feb-1946           MRN: [id number] Visit Date: 01/02/2022 Requested by: Rose Phi, [place],  [place] [id number] PCP: Rose Phi, FNP  Subjective: Chief Complaint  Patient presents with   Left Shoulder - Pain    HPI: [name] is a 76 year old patient with left shoulder pain.  Reports constant pain with locking and pain wakes her from sleep at night.  She has done well with her right shoulder replacement.  Had right RSA on 421.  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  Hemoglobin A1c around 8.  She takes insulin nightly.  She states that the injections did not help her right shoulder prior to surgery.              ROS: All systems reviewed are negative as they relate to the chief complaint within the history of present illness.  Patient denies  fevers or chills.   Assessment & Plan: Visit Diagnoses:  1. 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  Plan is thin cut CT scan left shoulder preop RSA.  Risk and benefits of reverse shoulder replacement discussed with the patient including not limited to infection or vessel damage instability incomplete pain relief as well as incomplete restoration of function.  We will need to make sure her A1c is less than 8 prior to surgery.  Think that CT scan pending and the patient will let me know what her A1c is in the near future because she is seeing her primary care physician shortly.  Follow-Up Instructions: No follow-ups on file.   Orders:  Orders Placed This Encounter  Procedures   XR Shoulder Left   CT SHOULDER LEFT WO CONTRAST   No orders of the defined types were placed in this encounter.     Procedures: No procedures performed   Clinical Data: No additional  findings.  Objective: Vital Signs: There were no vitals taken for this visit.  Physical Exam:   Constitutional: Patient appears well-developed HEENT:  Head: Normocephalic Eyes:EOM are normal Neck: Normal range of motion Cardiovascular: Normal rate Pulmonary/chest: Effort normal Neurologic: Patient is alert Skin: Skin is warm Psychiatric: Patient has normal mood and affect   Ortho Exam: Left shoulder range of motion is 25/85/130.  Deltoid is functional.  Does have pain coarseness and grinding with passive range of motion of that left shoulder.  Motor or sensory function of the hand is intact.  Cervical spine range of motion also intact.  Specialty Comments:  No specialty comments available.  Imaging: No results found.   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   Diabetes mellitus without complication ([facility])    H/O seasonal allergies    History of hiatal hernia    Hyperlipidemia    Hypertension    Lipoma of buttock    Pneumonia  as a child   Sleep apnea    no cpap    Family History  Family history unknown: Yes    Past Surgical History:  Procedure Laterality Date   ABDOMINAL HYSTERECTOMY     BACK SURGERY     lumbar fusion   CATARACT EXTRACTION, BILATERAL     CHOLECYSTECTOMY     COLONOSCOPY WITH PROPOFOL N/A 07/30/2017   Procedure: COLONOSCOPY WITH PROPOFOL;  Surgeon: Juanita Craver, MD;  Location: [facility];  Service: Endoscopy;  Laterality: N/A;   EXPLORATORY LAPAROTOMY     ovaries freed from intestines.   FOOT SURGERY     retained hardware.   KNEE ARTHROSCOPY Right    scope surgery   LIPOMA EXCISION  Left 11/19/2021   Procedure: EXCISION OF LEFT UPPER BUTTOCK LIPOMA;  Surgeon: Greer Pickerel, MD;  Location: [facility];  Service: General;  Laterality: Left;   TONSILLECTOMY     TOTAL KNEE ARTHROPLASTY Left 02/16/2015   Procedure: LEFT TOTAL KNEE ARTHROPLASTY;  Surgeon: Sydnee Cabal, MD;  Location: [facility];  Service: Orthopedics;  Laterality: Left;   TOTAL SHOULDER ARTHROPLASTY Right 07/26/2019   Procedure: RIGHT TOTAL SHOULDER ARTHROPLASTY;  Surgeon: Meredith Pel, MD;  Location: [facility];  Service: Orthopedics;  Laterality: Right;   WRIST SURGERY Left    x2 "cysts"   Social History   Occupational History   Not on file  Tobacco Use   Smoking status: Former    Packs/day: 0.50    Years: 10.00    Total pack years: 5.00    Types: Cigarettes    Quit date: 02/07/1973    Years since quitting: 48.9   Smokeless tobacco: Never  Substance and Sexual Activity   Alcohol use: Yes    Comment: rare occ.   Drug use: No   Sexual activity: Not Currently    Birth control/protection: Surgical    Comment: Hyst

## 2022-01-29 ENCOUNTER — Ambulatory Visit
Admission: RE | Admit: 2022-01-29 | Discharge: 2022-01-29 | Disposition: A | Discharge status: Home / Self Care | Payer: Medicare PPO | Source: Ambulatory Visit | Attending: Orthopedic Surgery | Admitting: Orthopedic Surgery

## 2022-01-29 DIAGNOSIS — M25512 Pain in left shoulder: Secondary | ICD-10-CM

## 2022-01-31 ENCOUNTER — Telehealth: Payer: Self-pay

## 2022-01-31 NOTE — Telephone Encounter (Signed)
IC patient to schedule nurse visit to check A1C but she said she wanted to go to her PCP to get this done. She will have them send you results.

## 2022-01-31 NOTE — Progress Notes (Signed)
Needs hgb a1c then we can schedule thx

## 2022-01-31 NOTE — Telephone Encounter (Signed)
thx

## 2022-01-31 NOTE — Telephone Encounter (Signed)
-----   Message from Meredith Pel, MD sent at 01/31/2022  2:37 PM EDT ----- Needs hgb a1c then we can schedule thx

## 2022-02-17 ENCOUNTER — Telehealth: Payer: Self-pay | Admitting: Orthopedic Surgery

## 2022-02-17 NOTE — Telephone Encounter (Signed)
Blue sheet on on desk.  Thanks kidney given to [facility]

## 2022-02-17 NOTE — Telephone Encounter (Signed)
Pt called with her A1-C report. 7.8. Please call pt at [phone].

## 2022-03-19 ENCOUNTER — Other Ambulatory Visit: Payer: Self-pay | Admitting: Registered Nurse

## 2022-03-19 DIAGNOSIS — R911 Solitary pulmonary nodule: Secondary | ICD-10-CM

## 2022-04-28 ENCOUNTER — Other Ambulatory Visit: Payer: Self-pay

## 2022-04-29 NOTE — Pre-Procedure Instructions (Addendum)
Surgical Instructions    Your procedure is scheduled on Tuesday, January 23rd.  Report to [facility] Main Entrance "A" at 05:30 A.M., then check in with the Admitting office.  Call this number if you have problems the morning of surgery:  [phone]  If you have any questions prior to your surgery date call [phone]: Open Monday-Friday 8am-4pm If you experience any cold or flu symptoms such as cough, fever, chills, shortness of breath, etc. between now and your scheduled surgery, please notify us at the above number.     Remember:  Do not eat after midnight the night before your surgery  You may drink clear liquids until 04:30 AM the morning of your surgery.   Clear liquids allowed are: Water, Non-Citrus Juices (without pulp), Carbonated Beverages, Clear Tea, Black Coffee Only (NO MILK, CREAM OR POWDERED CREAMER of any kind), and Gatorade.   Patient Instructions  The night before surgery:  No food after midnight. ONLY clear liquids after midnight   The day of surgery (if you have diabetes): Drink ONE (1) 12 oz G2 given to you in your pre admission testing appointment by 04:30 AM the morning of surgery. Drink in one sitting. Do not sip.  This drink was given to you during your hospital  pre-op appointment visit.  Nothing else to drink after completing the  12 oz bottle of G2.         If you have questions, please contact your surgeon's office.     Take these medicines the morning of surgery with A SIP OF WATER  buPROPion ER (WELLBUTRIN SR)  rosuvastatin (CRESTOR)   If needed: acetaminophen (TYLENOL)  Carboxymethylcellul-Glycerin (LUBRICATING EYE DROPS OP)  diphenhydrAMINE (BENADRYL)     Follow your surgeon's instructions on when to stop Aspirin.  If no instructions were given by your surgeon then you will need to call the office to get those instructions.     As of today, STOP taking any Aleve, Naproxen, Ibuprofen, Motrin, Advil, Goody's, BC's, all herbal  medications, fish oil, and all vitamins. This includes meloxicam (MOBIC)   WHAT DO I DO ABOUT MY DIABETES MEDICATION?   Do not take glipiZIDE (GLUCOTROL XL) the morning of surgery.  THE NIGHT BEFORE SURGERY, take 20 units (50%) of Insulin Glargine (LANTUS SOLOSTAR).        HOW TO MANAGE YOUR DIABETES BEFORE AND AFTER SURGERY  Why is it important to control my blood sugar before and after surgery? Improving blood sugar levels before and after surgery helps healing and can limit problems. A way of improving blood sugar control is eating a healthy diet by:  Eating less sugar and carbohydrates  Increasing activity/exercise  Talking with your doctor about reaching your blood sugar goals High blood sugars (greater than 180 mg/dL) can raise your risk of infections and slow your recovery, so you will need to focus on controlling your diabetes during the weeks before surgery. Make sure that the doctor who takes care of your diabetes knows about your planned surgery including the date and location.  How do I manage my blood sugar before surgery? Check your blood sugar at least 4 times a day, starting 2 days before surgery, to make sure that the level is not too high or low.  Check your blood sugar the morning of your surgery when you wake up and every 2 hours until you get to the Short Stay unit.  If your blood sugar is less than 70 mg/dL, you will need to treat  for low blood sugar: Do not take insulin. Treat a low blood sugar (less than 70 mg/dL) with  cup of clear juice (cranberry or apple), 4 glucose tablets, OR glucose gel. Recheck blood sugar in 15 minutes after treatment (to make sure it is greater than 70 mg/dL). If your blood sugar is not greater than 70 mg/dL on recheck, call [phone] for further instructions. Report your blood sugar to the short stay nurse when you get to Short Stay.  If you are admitted to the hospital after surgery: Your blood sugar will be checked by the  staff and you will probably be given insulin after surgery (instead of oral diabetes medicines) to make sure you have good blood sugar levels. The goal for blood sugar control after surgery is 80-180 mg/dL.                     Do NOT Smoke (Tobacco/Vaping) for 24 hours prior to your procedure.  If you use a CPAP at night, you may bring your mask/headgear for your overnight stay.   Contacts, glasses, piercing's, hearing aid's, dentures or partials may not be worn into surgery, please bring cases for these belongings.    For patients admitted to the hospital, discharge time will be determined by your treatment team.   Patients discharged the day of surgery will not be allowed to drive home, and someone needs to stay with them for 24 hours.  SURGICAL WAITING ROOM VISITATION Patients having surgery or a procedure may have no more than 2 support people in the waiting area - these visitors may rotate.   Children under the age of 43 must have an adult with them who is not the patient. If the patient needs to stay at the hospital during part of their recovery, the visitor guidelines for inpatient rooms apply. Pre-op nurse will coordinate an appropriate time for 1 support person to accompany patient in pre-op.  This support person may not rotate.   Please refer to the [facility] website for the visitor guidelines for Inpatients (after your surgery is over and you are in a regular room).    Oral Hygiene is also important to reduce your risk of infection.  Remember - BRUSH YOUR TEETH THE MORNING OF SURGERY WITH YOUR REGULAR TOOTHPASTE  - Preparing for Total Shoulder Arthroplasty  Before surgery, you can play an important role. Because skin is not sterile, your skin needs to be as free of germs as possible. You can reduce the number of germs on your skin by using the following products.   Benzoyl Peroxide Gel  o Reduces the number of germs present on the skin  o Applied twice a day to  shoulder area starting two days before surgery   Chlorhexidine Gluconate (CHG) Soap (instructions listed above on how to wash with CHG Soap)  o An antiseptic cleaner that kills germs and bonds with the skin to continue killing germs even after washing  o Used for showering the night before surgery and morning of surgery   ==================================================================  Please follow these instructions carefully:  BENZOYL PEROXIDE 5% GEL  Please do not use if you have an allergy to benzoyl peroxide. If your skin becomes reddened/irritated stop using the benzoyl peroxide.  Starting two days before surgery, apply as follows:  1. Apply benzoyl peroxide in the morning and at night. Apply after taking a shower. If you are not taking a shower clean entire shoulder front, back, and side along with the  armpit with a clean wet washcloth.  2. Place a quarter-sized dollop on your SHOULDER and rub in thoroughly, making sure to cover the front, back, and side of your shoulder, along with the armpit.   2 Days prior to Surgery First Dose on _____________ Morning Second Dose on ______________ Night  Day Before Surgery First Dose on ______________ Morning Night before surgery wash (entire body except face and private areas) with CHG Soap THEN Second Dose on ____________ Night   Morning of Surgery  wash BODY AGAIN with CHG Soap   4. Do NOT apply benzoyl peroxide gel on the day of surgery   [place]- Preparing For Surgery  Before surgery, you can play an important role. Because skin is not sterile, your skin needs to be as free of germs as possible. You can reduce the number of germs on your skin by washing with CHG (chlorahexidine gluconate) Soap before surgery.  CHG is an antiseptic cleaner which kills germs and bonds with the skin to continue killing germs even after washing.     Please do not use if you have an allergy to CHG or antibacterial soaps. If your skin  becomes reddened/irritated stop using the CHG.  Do not shave (including legs and underarms) for at least 48 hours prior to first CHG shower. It is OK to shave your face.  Please follow these instructions carefully.     Shower the NIGHT BEFORE SURGERY and the MORNING OF SURGERY with CHG Soap.   If you chose to wash your hair, wash your hair first as usual with your normal shampoo. After you shampoo, rinse your hair and body thoroughly to remove the shampoo.  Then [employer] and genitals (private parts) with your normal soap and rinse thoroughly to remove soap.  After that Use CHG Soap as you would any other liquid soap. You can apply CHG directly to the skin and wash gently with a scrungie or a clean washcloth.   Apply the CHG Soap to your body ONLY FROM THE NECK DOWN.  Do not use on open wounds or open sores. Avoid contact with your eyes, ears, mouth and genitals (private parts). Wash Face and genitals (private parts)  with your normal soap.   Wash thoroughly, paying special attention to the area where your surgery will be performed.  Thoroughly rinse your body with warm water from the neck down.  DO NOT shower/wash with your normal soap after using and rinsing off the CHG Soap.  Pat yourself dry with a CLEAN TOWEL.  8. Apply the Benzoyl Peroxide only the night before surgery.  Do Not use it the morning of surgery.  Wear CLEAN PAJAMAS to bed the night before surgery  Place CLEAN SHEETS on your bed the night before your surgery  DO NOT SLEEP WITH PETS.      Day of Surgery: Take a shower with CHG soap. Do not wear jewelry or makeup Do not wear lotions, powders, perfumes, or deodorant. Do not shave 48 hours prior to surgery.   Do not bring valuables to the hospital. [facility] is not responsible for any belongings or valuables. Do not wear nail polish, gel polish, artificial nails, or any other type of covering on natural nails (fingers and toes) If you have artificial nails or gel  coating that need to be removed by a nail salon, please have this removed prior to surgery. Artificial nails or gel coating may interfere with anesthesia's ability to adequately monitor your vital signs. Wear  Clean/Comfortable clothing the morning of surgery Remember to brush your teeth WITH YOUR REGULAR TOOTHPASTE.   Please read over the following fact sheets that you were given.    If you received a COVID test during your pre-op visit  it is requested that you wear a mask when out in public, stay away from anyone that may not be feeling well and notify your surgeon if you develop symptoms. If you have been in contact with anyone that has tested positive in the last 10 days please notify you surgeon.

## 2022-04-30 ENCOUNTER — Other Ambulatory Visit: Payer: Self-pay

## 2022-04-30 ENCOUNTER — Encounter (HOSPITAL_COMMUNITY)
Admission: RE | Admit: 2022-04-30 | Discharge: 2022-04-30 | Disposition: A | Discharge status: Home / Self Care | Payer: No Typology Code available for payment source | Source: Ambulatory Visit | Attending: Orthopedic Surgery | Admitting: Orthopedic Surgery

## 2022-04-30 ENCOUNTER — Encounter (HOSPITAL_COMMUNITY): Payer: Self-pay

## 2022-04-30 VITALS — BP 162/93 | HR 77 | Temp 97.9°F | Resp 17

## 2022-04-30 DIAGNOSIS — E785 Hyperlipidemia, unspecified: Secondary | ICD-10-CM | POA: Diagnosis not present

## 2022-04-30 DIAGNOSIS — M19012 Primary osteoarthritis, left shoulder: Secondary | ICD-10-CM | POA: Diagnosis not present

## 2022-04-30 DIAGNOSIS — I10 Essential (primary) hypertension: Secondary | ICD-10-CM | POA: Insufficient documentation

## 2022-04-30 DIAGNOSIS — Z87891 Personal history of nicotine dependence: Secondary | ICD-10-CM | POA: Diagnosis not present

## 2022-04-30 DIAGNOSIS — E119 Type 2 diabetes mellitus without complications: Secondary | ICD-10-CM | POA: Diagnosis not present

## 2022-04-30 DIAGNOSIS — Z01818 Encounter for other preprocedural examination: Secondary | ICD-10-CM | POA: Diagnosis present

## 2022-04-30 DIAGNOSIS — G4733 Obstructive sleep apnea (adult) (pediatric): Secondary | ICD-10-CM | POA: Diagnosis not present

## 2022-04-30 DIAGNOSIS — N390 Urinary tract infection, site not specified: Secondary | ICD-10-CM | POA: Diagnosis not present

## 2022-04-30 LAB — SURGICAL PCR SCREEN
MRSA, PCR: NEGATIVE
Staphylococcus aureus: NEGATIVE

## 2022-04-30 LAB — CBC
HCT: 39.7 % (ref 36.0–46.0)
Hemoglobin: 13.6 g/dL (ref 12.0–15.0)
MCH: 28.9 pg (ref 26.0–34.0)
MCHC: 34.3 g/dL (ref 30.0–36.0)
MCV: 84.3 fL (ref 80.0–100.0)
Platelets: 279 10*3/uL (ref 150–400)
RBC: 4.71 MIL/uL (ref 3.87–5.11)
RDW: 14 % (ref 11.5–15.5)
WBC: 9.4 10*3/uL (ref 4.0–10.5)
nRBC: 0 % (ref 0.0–0.2)

## 2022-04-30 LAB — URINALYSIS, ROUTINE W REFLEX MICROSCOPIC
Bilirubin Urine: NEGATIVE
Glucose, UA: NEGATIVE mg/dL
Ketones, ur: NEGATIVE mg/dL
Nitrite: POSITIVE — AB
Protein, ur: 30 mg/dL — AB
Specific Gravity, Urine: >1.03 — ABNORMAL HIGH (ref 1.005–1.030)
pH: 5.5 (ref 5.0–8.0)

## 2022-04-30 LAB — URINALYSIS, MICROSCOPIC (REFLEX): RBC / HPF: NONE SEEN RBC/hpf (ref 0–5)

## 2022-04-30 LAB — BASIC METABOLIC PANEL
Anion gap: 9 (ref 5–15)
BUN: 14 mg/dL (ref 8–23)
CO2: 24 mmol/L (ref 22–32)
Calcium: 9.5 mg/dL (ref 8.9–10.3)
Chloride: 106 mmol/L (ref 98–111)
Creatinine, Ser: 0.93 mg/dL (ref 0.44–1.00)
GFR, Estimated: >60 mL/min (ref >60)
Glucose, Bld: 143 mg/dL — ABNORMAL HIGH (ref 70–99)
Potassium: 3.9 mmol/L (ref 3.5–5.1)
Sodium: 139 mmol/L (ref 135–145)

## 2022-04-30 LAB — GLUCOSE, CAPILLARY: Glucose-Capillary: 168 mg/dL — ABNORMAL HIGH (ref 70–99)

## 2022-04-30 LAB — HEMOGLOBIN A1C
Hgb A1c MFr Bld: 7.7 % — ABNORMAL HIGH (ref 4.8–5.6)
Mean Plasma Glucose: 174.29 mg/dL

## 2022-04-30 NOTE — Progress Notes (Signed)
PCP - Dr. Arthur Holms Cardiologist - denies  PPM/ICD - denies   Chest x-ray - 09/05/17 EKG - 11/15/21 Stress Test - 08/28/10 ECHO - 05/17/09 Cardiac Cath - 01/05/04  Sleep Study - Sleep study 15+ years ago, per pt, OSA+ CPAP - denies (does not tolerate)  DM- Type 2 Pt states she just "spot checks" CBG, does not know average fasting levels  Last dose of GLP1 agonist-  n/a   Blood Thinner Instructions: n/a Aspirin Instructions: f/u with surgeon  ERAS Protcol - yes PRE-SURGERY G2- given at PAT  COVID TEST- n/a   Anesthesia review: yes, high BP reading at PAT  Patient denies shortness of breath, fever, cough and chest pain at PAT appointment   All instructions explained to the patient, with a verbal understanding of the material. Patient agrees to go over the instructions while at home for a better understanding. The opportunity to ask questions was provided.

## 2022-04-30 NOTE — Progress Notes (Signed)
PCP - Dr. Arthur Holms Cardiologist - denies   PPM/ICD - denies     Chest x-ray - 09/05/17 EKG - 11/15/21 Stress Test - 08/28/10 ECHO - 05/17/09 Cardiac Cath - 01/05/04   Sleep Study - Sleep study 15+ years ago, per pt, OSA+ CPAP - denies (does not tolerate)   DM- Type 2 Pt states she just "spot checks" CBG, does not know average fasting levels   Last dose of GLP1 agonist-  n/a     Blood Thinner Instructions: n/a Aspirin Instructions: f/u with surgeon   ERAS Protcol - yes PRE-SURGERY G2- given at PAT   COVID TEST- n/a Patient states positive COVID test between thanksgiving and Christmas.     Anesthesia review: yes, high BP reading at PAT   Patient denies shortness of breath, fever, cough and chest pain at PAT appointment     All instructions explained to the patient, with a verbal understanding of the material. Patient agrees to go over the instructions while at home for a better understanding. The opportunity to ask questions was provided.

## 2022-05-01 ENCOUNTER — Other Ambulatory Visit: Payer: Self-pay | Admitting: Orthopedic Surgery

## 2022-05-01 NOTE — Anesthesia Preprocedure Evaluation (Addendum)
Anesthesia Evaluation  Patient identified by MRN, date of birth, ID band Patient awake    Reviewed: Allergy & Precautions, NPO status , Patient's Chart, lab work & pertinent test results  History of Anesthesia Complications Negative for: history of anesthetic complications  Airway Mallampati: II  TM Distance: >3 FB Neck ROM: Full    Dental  (+) Missing,    Pulmonary sleep apnea , former smoker   Pulmonary exam normal        Cardiovascular hypertension, Pt. on medications Normal cardiovascular exam     Neuro/Psych   Anxiety     negative neurological ROS     GI/Hepatic Neg liver ROS, hiatal hernia,,,  Endo/Other  diabetes, Type 2, Insulin Dependent, Oral Hypoglycemic Agents  Morbid obesity  Renal/GU negative Renal ROS  negative genitourinary   Musculoskeletal  (+) Arthritis ,    Abdominal   Peds  Hematology negative hematology ROS (+)   Anesthesia Other Findings Day of surgery medications reviewed with patient.  Reproductive/Obstetrics negative OB ROS                              Anesthesia Physical Anesthesia Plan  ASA: 3  Anesthesia Plan: General   Post-op Pain Management: Regional block* and Tylenol PO (pre-op)*   Induction: Intravenous  PONV Risk Score and Plan: 3 and Treatment may vary due to age or medical condition, Dexamethasone and Ondansetron  Airway Management Planned: Oral ETT  Additional Equipment: None  Intra-op Plan:   Post-operative Plan: Extubation in OR  Informed Consent: I have reviewed the patients History and Physical, chart, labs and discussed the procedure including the risks, benefits and alternatives for the proposed anesthesia with the patient or authorized representative who has indicated his/her understanding and acceptance.     Dental advisory given  Plan Discussed with: CRNA  Anesthesia Plan Comments: (PAT note written 05/01/2022 by Myra Gianotti, PA-C.  )        Anesthesia Quick Evaluation

## 2022-05-01 NOTE — Progress Notes (Signed)
Anesthesia Chart Review:  Case: [id number] Date/Time: 05/06/22 0715   Procedure: LEFT REVERSE SHOULDER ARTHROPLASTY (Left: Shoulder)   Anesthesia type: General   Pre-op diagnosis: left shoulder osteoarthritis   Location: [facility] OR ROOM 06 / [facility]   Surgeons: Meredith Pel, MD       DISCUSSION: Patient is a 77 year old female scheduled for the above procedure.  History includes former smoker (quit 02/07/73), HTN, DM2, HLD, hiatal hernia, OSA (intolerant to CPAP), osteoarthritis (left TKA 02/16/15, right TSA 07/26/19), spinal surgery, lipoma (excision left buttocks 11/19/21). + COVID ~ early December 2023.  A1c 7.7%. Urine culture + E. Coli. These were communicated to surgical team for any preoperative recommendations.   Anesthesia team to evaluate on the day of surgery.    VS: BP (!) 162/93   Pulse 77   Temp 36.6 C   Resp 17   SpO2 99%    PROVIDERS: Arthur Holms, NP is PCP    LABS: Preoperative labs noted. A1c 7.7%, Urine culture + E. Coli. Message sent to Dr. Marlou Sa and Aileen Pilot, PA. Defer treatment recommendations to surgeon. (all labs ordered are listed, but only abnormal results are displayed)  Labs Reviewed  URINE CULTURE - Abnormal; Notable for the following components:      Result Value   Culture   (*)    Value: >=100,000 COLONIES/mL ESCHERICHIA COLI SUSCEPTIBILITIES TO FOLLOW Performed at [facility], [place] [address], [place], [place] [address]    All other components within normal limits  GLUCOSE, CAPILLARY - Abnormal; Notable for the following components:   Glucose-Capillary 168 (*)    All other components within normal limits  BASIC METABOLIC PANEL - Abnormal; Notable for the following components:   Glucose, Bld 143 (*)    All other components within normal limits  URINALYSIS, ROUTINE W REFLEX MICROSCOPIC - Abnormal; Notable for the following components:   Specific Gravity, Urine >1.030 (*)    Hgb urine dipstick TRACE (*)    Protein, ur 30 (*)     Nitrite POSITIVE (*)    Leukocytes,Ua SMALL (*)    All other components within normal limits  HEMOGLOBIN A1C - Abnormal; Notable for the following components:   Hgb A1c MFr Bld 7.7 (*)    All other components within normal limits  URINALYSIS, MICROSCOPIC (REFLEX) - Abnormal; Notable for the following components:   Bacteria, UA MANY (*)    All other components within normal limits  SURGICAL PCR SCREEN  CBC     IMAGES: CT left shoulder 02/08/22: IMPRESSION: 1. Advanced glenohumeral joint degenerative changes as described above. 2. Grossly by CT the rotator cuff tendons are intact. Aortic Atherosclerosis (ICD10-I70.0).     EKG: 11/15/21: Sinus rhythm with marked sinus arrhythmia with 1st degree A-V block Inferior infarct , age undetermined Nonspecific T wave abnormality Abnormal ECG When compared with ECG of 26-Jul-2019 06:14, PREVIOUS ECG IS PRESENT since last tracing, T wave abnormality improved Confirmed by Charolette Forward ([id number]) on 11/15/2021 2:00:49 PM   CV: Nuclear stress test 07/05/09: IMPRESSION:  Normal study.  No evidence of pharmacologic induced myocardial ischemia.  Normal left ventricular wall motion study and ejection fraction.    Echo 05/17/09: Study Conclusions  Left ventricle: There was mild concentric hypertrophy. Systolic  function was normal. The estimated ejection fraction was in the  range of 60% to 65%. Wall motion was normal; there were no regional wall motion abnormalities. Doppler parameters are consistent with abnormal left ventricular relaxation (grade 1 diastolic  dysfunction).      LHC 01/05/04: ASSESSMENT:  1.  No cardiac chest pain.  2.  Normal coronary arteries.  3.  Normal left ventricular function.  4.  Mildly increased LVEDP questionable secondary to diastolic dysfunction.   Past Medical History:  Diagnosis Date   Anxiety    Arthritis    osteoarthritis left knee, right shoulder "locks" occ.   Diabetes mellitus without complication  ([place])    H/O seasonal allergies    History of hiatal hernia    Hyperlipidemia    Hypertension    Lipoma of buttock    Pneumonia    as a child   Sleep apnea    no cpap    Past Surgical History:  Procedure Laterality Date   ABDOMINAL HYSTERECTOMY     ovaries still intact   BACK SURGERY     lumbar fusion   CARPAL TUNNEL RELEASE Right    CATARACT EXTRACTION, BILATERAL     CHOLECYSTECTOMY     COLONOSCOPY WITH PROPOFOL N/A 07/30/2017   Procedure: COLONOSCOPY WITH PROPOFOL;  Surgeon: Juanita Craver, MD;  Location: [facility];  Service: Endoscopy;  Laterality: N/A;   ELBOW SURGERY Right    EXPLORATORY LAPAROTOMY     ovaries freed from intestines.   FOOT SURGERY     retained hardware.   KNEE ARTHROSCOPY Right    scope surgery   LIPOMA EXCISION Left 11/19/2021   Procedure: EXCISION OF LEFT UPPER BUTTOCK LIPOMA;  Surgeon: Greer Pickerel, MD;  Location: [facility];  Service: General;  Laterality: Left;   TONSILLECTOMY     TOTAL KNEE ARTHROPLASTY Left 02/16/2015   Procedure: LEFT TOTAL KNEE ARTHROPLASTY;  Surgeon: Sydnee Cabal, MD;  Location: [facility];  Service: Orthopedics;  Laterality: Left;   TOTAL SHOULDER ARTHROPLASTY Right 07/26/2019   Procedure: RIGHT TOTAL SHOULDER ARTHROPLASTY;  Surgeon: Meredith Pel, MD;  Location: [facility];  Service: Orthopedics;  Laterality: Right;   WRIST SURGERY Left    x2 "cysts"    MEDICATIONS:  acetaminophen (TYLENOL) 500 MG tablet   aspirin EC 81 MG tablet   buPROPion ER (WELLBUTRIN SR) 100 MG 12 hr tablet   Carboxymethylcellul-Glycerin (LUBRICATING EYE DROPS OP)   diphenhydrAMINE (BENADRYL) 25 MG tablet   glipiZIDE (GLUCOTROL XL) 10 MG 24 hr tablet   Insulin Glargine (LANTUS SOLOSTAR) 100 UNIT/ML Solostar Pen   lisinopril (ZESTRIL) 10 MG tablet   meloxicam (MOBIC) 15 MG tablet   OVER THE COUNTER MEDICATION   rosuvastatin (CRESTOR) 10 MG tablet   No current facility-administered medications for this encounter.  CBD gummies  1 daily.    Myra Gianotti, PA-C Surgical Short Stay/Anesthesiology [facility] Phone [phone] [facility] Phone [phone] 05/01/2022 3:48 PM

## 2022-05-02 ENCOUNTER — Other Ambulatory Visit: Payer: Self-pay | Admitting: Surgical

## 2022-05-02 LAB — URINE CULTURE: Culture: >=100000 — AB

## 2022-05-02 MED ORDER — CIPROFLOXACIN HCL 500 MG PO TABS: 500.0000 mg | ORAL_TABLET | Freq: Two times a day (BID) | ORAL | 0 refills | Status: DC

## 2022-05-04 ENCOUNTER — Other Ambulatory Visit: Payer: Self-pay | Admitting: Surgical

## 2022-05-04 MED ORDER — CIPROFLOXACIN HCL 500 MG PO TABS: 500.0000 mg | ORAL_TABLET | Freq: Two times a day (BID) | ORAL | 0 refills | Status: DC

## 2022-05-06 ENCOUNTER — Ambulatory Visit (HOSPITAL_COMMUNITY): Payer: No Typology Code available for payment source | Admitting: Vascular Surgery

## 2022-05-06 ENCOUNTER — Other Ambulatory Visit: Payer: Self-pay

## 2022-05-06 ENCOUNTER — Encounter (HOSPITAL_COMMUNITY)
Admission: RE | Disposition: A | Discharge status: Home / Self Care | Payer: Self-pay | Source: Home / Self Care | Attending: Orthopedic Surgery

## 2022-05-06 ENCOUNTER — Observation Stay (HOSPITAL_COMMUNITY): Payer: No Typology Code available for payment source

## 2022-05-06 ENCOUNTER — Observation Stay (HOSPITAL_COMMUNITY)
Admission: RE | Admit: 2022-05-06 | Discharge: 2022-05-07 | Disposition: A | Discharge status: Home / Self Care | Payer: No Typology Code available for payment source | Attending: Orthopedic Surgery | Admitting: Orthopedic Surgery

## 2022-05-06 ENCOUNTER — Encounter (HOSPITAL_COMMUNITY): Payer: Self-pay | Admitting: Orthopedic Surgery

## 2022-05-06 ENCOUNTER — Ambulatory Visit (HOSPITAL_COMMUNITY): Payer: No Typology Code available for payment source | Admitting: Anesthesiology

## 2022-05-06 DIAGNOSIS — B9620 Unspecified Escherichia coli [E. coli] as the cause of diseases classified elsewhere: Secondary | ICD-10-CM | POA: Diagnosis not present

## 2022-05-06 DIAGNOSIS — Z01818 Encounter for other preprocedural examination: Secondary | ICD-10-CM

## 2022-05-06 DIAGNOSIS — M7522 Bicipital tendinitis, left shoulder: Secondary | ICD-10-CM | POA: Diagnosis not present

## 2022-05-06 DIAGNOSIS — Z96611 Presence of right artificial shoulder joint: Secondary | ICD-10-CM | POA: Diagnosis not present

## 2022-05-06 DIAGNOSIS — Z96652 Presence of left artificial knee joint: Secondary | ICD-10-CM | POA: Insufficient documentation

## 2022-05-06 DIAGNOSIS — N390 Urinary tract infection, site not specified: Secondary | ICD-10-CM | POA: Diagnosis not present

## 2022-05-06 DIAGNOSIS — I10 Essential (primary) hypertension: Secondary | ICD-10-CM

## 2022-05-06 DIAGNOSIS — Z7982 Long term (current) use of aspirin: Secondary | ICD-10-CM | POA: Insufficient documentation

## 2022-05-06 DIAGNOSIS — M19012 Primary osteoarthritis, left shoulder: Secondary | ICD-10-CM | POA: Diagnosis present

## 2022-05-06 DIAGNOSIS — Z87891 Personal history of nicotine dependence: Secondary | ICD-10-CM

## 2022-05-06 DIAGNOSIS — M2550 Pain in unspecified joint: Secondary | ICD-10-CM | POA: Insufficient documentation

## 2022-05-06 DIAGNOSIS — G4730 Sleep apnea, unspecified: Secondary | ICD-10-CM

## 2022-05-06 DIAGNOSIS — Z1611 Resistance to penicillins: Secondary | ICD-10-CM | POA: Insufficient documentation

## 2022-05-06 DIAGNOSIS — Z96612 Presence of left artificial shoulder joint: Secondary | ICD-10-CM

## 2022-05-06 DIAGNOSIS — M19019 Primary osteoarthritis, unspecified shoulder: Secondary | ICD-10-CM | POA: Diagnosis present

## 2022-05-06 DIAGNOSIS — E119 Type 2 diabetes mellitus without complications: Secondary | ICD-10-CM | POA: Diagnosis not present

## 2022-05-06 HISTORY — PX: REVERSE SHOULDER ARTHROPLASTY: SHX5054

## 2022-05-06 LAB — GLUCOSE, CAPILLARY
Glucose-Capillary: 179 mg/dL — ABNORMAL HIGH (ref 70–99)
Glucose-Capillary: 204 mg/dL — ABNORMAL HIGH (ref 70–99)
Glucose-Capillary: 263 mg/dL — ABNORMAL HIGH (ref 70–99)
Glucose-Capillary: 190 mg/dL — ABNORMAL HIGH (ref 70–99)

## 2022-05-06 SURGERY — ARTHROPLASTY, SHOULDER, TOTAL, REVERSE
Anesthesia: General | Site: Shoulder | Laterality: Left

## 2022-05-06 MED ORDER — FENTANYL CITRATE (PF) 100 MCG/2ML IJ SOLN
INTRAMUSCULAR | Status: AC
  Filled 2022-05-06: qty 2

## 2022-05-06 MED ORDER — LIDOCAINE 2% (20 MG/ML) 5 ML SYRINGE
INTRAMUSCULAR | Status: DC | PRN
  Administered 2022-05-06: 40 mg via INTRAVENOUS

## 2022-05-06 MED ORDER — ONDANSETRON HCL 4 MG/2ML IJ SOLN
INTRAMUSCULAR | Status: DC | PRN
  Administered 2022-05-06: 4 mg via INTRAVENOUS

## 2022-05-06 MED ORDER — OXYCODONE HCL 5 MG PO TABS
5.0000 mg | ORAL_TABLET | ORAL | Status: DC | PRN
  Administered 2022-05-06 – 2022-05-07 (×5): 5 mg via ORAL
  Filled 2022-05-06 (×5): qty 1

## 2022-05-06 MED ORDER — POVIDONE-IODINE 7.5 % EX SOLN
Freq: Once | CUTANEOUS | Status: DC
  Filled 2022-05-06: qty 118

## 2022-05-06 MED ORDER — CEFAZOLIN IN SODIUM CHLORIDE 3-0.9 GM/100ML-% IV SOLN
3.0000 g | INTRAVENOUS | Status: AC
  Administered 2022-05-06: 3 g via INTRAVENOUS
  Filled 2022-05-06: qty 100

## 2022-05-06 MED ORDER — METHOCARBAMOL 500 MG PO TABS: 500.0000 mg | ORAL_TABLET | Freq: Four times a day (QID) | ORAL | Status: DC | PRN

## 2022-05-06 MED ORDER — LACTATED RINGERS IV SOLN: INTRAVENOUS | Status: DC

## 2022-05-06 MED ORDER — PROPOFOL 10 MG/ML IV BOLUS
INTRAVENOUS | Status: AC
  Filled 2022-05-06: qty 20

## 2022-05-06 MED ORDER — BUPROPION HCL ER (SR) 100 MG PO TB12
100.0000 mg | ORAL_TABLET | Freq: Every morning | ORAL | Status: DC
  Administered 2022-05-07: 100 mg via ORAL
  Filled 2022-05-06: qty 1

## 2022-05-06 MED ORDER — DEXAMETHASONE SODIUM PHOSPHATE 10 MG/ML IJ SOLN
INTRAMUSCULAR | Status: AC
  Filled 2022-05-06: qty 1

## 2022-05-06 MED ORDER — POVIDONE-IODINE 10 % EX SWAB: 2.0000 | Freq: Once | CUTANEOUS | Status: DC

## 2022-05-06 MED ORDER — INSULIN ASPART 100 UNIT/ML IJ SOLN
0.0000 [IU] | Freq: Three times a day (TID) | INTRAMUSCULAR | Status: DC
  Administered 2022-05-06: 11 [IU] via SUBCUTANEOUS
  Administered 2022-05-07: 4 [IU] via SUBCUTANEOUS

## 2022-05-06 MED ORDER — BUPIVACAINE LIPOSOME 1.3 % IJ SUSP
INTRAMUSCULAR | Status: DC | PRN
  Administered 2022-05-06: 10 mL via PERINEURAL

## 2022-05-06 MED ORDER — VANCOMYCIN HCL 1000 MG IV SOLR
INTRAVENOUS | Status: DC | PRN
  Administered 2022-05-06: 1000 mg via TOPICAL

## 2022-05-06 MED ORDER — HYDROMORPHONE HCL 1 MG/ML IJ SOLN: 0.5000 mg | INTRAMUSCULAR | Status: DC | PRN

## 2022-05-06 MED ORDER — METHOCARBAMOL 1000 MG/10ML IJ SOLN
500.0000 mg | Freq: Four times a day (QID) | INTRAVENOUS | Status: DC | PRN
  Filled 2022-05-06: qty 5

## 2022-05-06 MED ORDER — CELECOXIB 100 MG PO CAPS
100.0000 mg | ORAL_CAPSULE | Freq: Two times a day (BID) | ORAL | Status: DC
  Administered 2022-05-06 – 2022-05-07 (×2): 100 mg via ORAL
  Filled 2022-05-06 (×4): qty 1

## 2022-05-06 MED ORDER — TRANEXAMIC ACID-NACL 1000-0.7 MG/100ML-% IV SOLN
1000.0000 mg | INTRAVENOUS | Status: AC
  Administered 2022-05-06: 1000 mg via INTRAVENOUS
  Filled 2022-05-06: qty 100

## 2022-05-06 MED ORDER — ONDANSETRON HCL 4 MG/2ML IJ SOLN: 4.0000 mg | Freq: Four times a day (QID) | INTRAMUSCULAR | Status: DC | PRN

## 2022-05-06 MED ORDER — INSULIN ASPART 100 UNIT/ML IJ SOLN
4.0000 [IU] | Freq: Three times a day (TID) | INTRAMUSCULAR | Status: DC
  Administered 2022-05-06 – 2022-05-07 (×2): 4 [IU] via SUBCUTANEOUS

## 2022-05-06 MED ORDER — LIDOCAINE 2% (20 MG/ML) 5 ML SYRINGE
INTRAMUSCULAR | Status: AC
  Filled 2022-05-06: qty 5

## 2022-05-06 MED ORDER — ACETAMINOPHEN 325 MG PO TABS: 325.0000 mg | ORAL_TABLET | Freq: Four times a day (QID) | ORAL | Status: DC | PRN

## 2022-05-06 MED ORDER — INSULIN ASPART 100 UNIT/ML IJ SOLN: 0.0000 [IU] | Freq: Every day | INTRAMUSCULAR | Status: DC

## 2022-05-06 MED ORDER — AMISULPRIDE (ANTIEMETIC) 5 MG/2ML IV SOLN: 10.0000 mg | Freq: Once | INTRAVENOUS | Status: DC | PRN

## 2022-05-06 MED ORDER — ONDANSETRON HCL 4 MG PO TABS: 4.0000 mg | ORAL_TABLET | Freq: Four times a day (QID) | ORAL | Status: DC | PRN

## 2022-05-06 MED ORDER — PHENYLEPHRINE HCL-NACL 20-0.9 MG/250ML-% IV SOLN
INTRAVENOUS | Status: DC | PRN
  Administered 2022-05-06: 50 ug/min via INTRAVENOUS

## 2022-05-06 MED ORDER — ACETAMINOPHEN 500 MG PO TABS
1000.0000 mg | ORAL_TABLET | Freq: Once | ORAL | Status: AC
  Administered 2022-05-06: 1000 mg via ORAL
  Filled 2022-05-06: qty 2

## 2022-05-06 MED ORDER — INSULIN ASPART 100 UNIT/ML IJ SOLN: 0.0000 [IU] | INTRAMUSCULAR | Status: DC | PRN

## 2022-05-06 MED ORDER — VANCOMYCIN HCL 1000 MG IV SOLR
INTRAVENOUS | Status: AC
  Filled 2022-05-06: qty 20

## 2022-05-06 MED ORDER — OXYCODONE HCL 5 MG/5ML PO SOLN: 5.0000 mg | Freq: Once | ORAL | Status: DC | PRN

## 2022-05-06 MED ORDER — FENTANYL CITRATE (PF) 100 MCG/2ML IJ SOLN
25.0000 ug | INTRAMUSCULAR | Status: DC | PRN
  Administered 2022-05-06 (×3): 50 ug via INTRAVENOUS

## 2022-05-06 MED ORDER — MIDAZOLAM HCL 2 MG/2ML IJ SOLN
INTRAMUSCULAR | Status: DC | PRN
  Administered 2022-05-06 (×2): 1 mg via INTRAVENOUS

## 2022-05-06 MED ORDER — GLIPIZIDE ER 10 MG PO TB24
10.0000 mg | ORAL_TABLET | Freq: Every day | ORAL | Status: DC
  Administered 2022-05-07: 10 mg via ORAL
  Filled 2022-05-06: qty 1

## 2022-05-06 MED ORDER — PHENOL 1.4 % MT LIQD: 1.0000 | OROMUCOSAL | Status: DC | PRN

## 2022-05-06 MED ORDER — CHLORHEXIDINE GLUCONATE 0.12 % MT SOLN
15.0000 mL | Freq: Once | OROMUCOSAL | Status: AC
  Administered 2022-05-06: 15 mL via OROMUCOSAL
  Filled 2022-05-06: qty 15

## 2022-05-06 MED ORDER — FENTANYL CITRATE (PF) 250 MCG/5ML IJ SOLN
INTRAMUSCULAR | Status: DC | PRN
  Administered 2022-05-06: 100 ug via INTRAVENOUS
  Administered 2022-05-06: 50 ug via INTRAVENOUS

## 2022-05-06 MED ORDER — SUGAMMADEX SODIUM 200 MG/2ML IV SOLN
INTRAVENOUS | Status: DC | PRN
  Administered 2022-05-06: 150 mg via INTRAVENOUS

## 2022-05-06 MED ORDER — CEFAZOLIN SODIUM-DEXTROSE 2-4 GM/100ML-% IV SOLN
2.0000 g | Freq: Three times a day (TID) | INTRAVENOUS | Status: AC
  Administered 2022-05-06 – 2022-05-07 (×3): 2 g via INTRAVENOUS
  Filled 2022-05-06 (×3): qty 100

## 2022-05-06 MED ORDER — ACETAMINOPHEN 500 MG PO TABS
1000.0000 mg | ORAL_TABLET | Freq: Four times a day (QID) | ORAL | Status: AC
  Administered 2022-05-06 – 2022-05-07 (×4): 1000 mg via ORAL
  Filled 2022-05-06 (×4): qty 2

## 2022-05-06 MED ORDER — DEXTROSE 5 % IV SOLN: INTRAVENOUS | Status: DC | PRN

## 2022-05-06 MED ORDER — DOCUSATE SODIUM 100 MG PO CAPS
100.0000 mg | ORAL_CAPSULE | Freq: Two times a day (BID) | ORAL | Status: DC
  Administered 2022-05-06 – 2022-05-07 (×3): 100 mg via ORAL
  Filled 2022-05-06 (×3): qty 1

## 2022-05-06 MED ORDER — PROPOFOL 10 MG/ML IV BOLUS
INTRAVENOUS | Status: DC | PRN
  Administered 2022-05-06: 200 mg via INTRAVENOUS

## 2022-05-06 MED ORDER — FENTANYL CITRATE (PF) 250 MCG/5ML IJ SOLN
INTRAMUSCULAR | Status: AC
  Filled 2022-05-06: qty 5

## 2022-05-06 MED ORDER — ASPIRIN 81 MG PO TBEC
81.0000 mg | DELAYED_RELEASE_TABLET | Freq: Every day | ORAL | Status: DC
  Administered 2022-05-06 – 2022-05-07 (×2): 81 mg via ORAL
  Filled 2022-05-06 (×2): qty 1

## 2022-05-06 MED ORDER — METOCLOPRAMIDE HCL 5 MG PO TABS: 5.0000 mg | ORAL_TABLET | Freq: Three times a day (TID) | ORAL | Status: DC | PRN

## 2022-05-06 MED ORDER — LISINOPRIL 10 MG PO TABS
10.0000 mg | ORAL_TABLET | Freq: Every day | ORAL | Status: DC
  Administered 2022-05-07: 10 mg via ORAL
  Filled 2022-05-06: qty 1

## 2022-05-06 MED ORDER — 0.9 % SODIUM CHLORIDE (POUR BTL) OPTIME
TOPICAL | Status: DC | PRN
  Administered 2022-05-06: 5000 mL

## 2022-05-06 MED ORDER — ONDANSETRON HCL 4 MG/2ML IJ SOLN
INTRAMUSCULAR | Status: AC
  Filled 2022-05-06: qty 2

## 2022-05-06 MED ORDER — IRRISEPT - 450ML BOTTLE WITH 0.05% CHG IN STERILE WATER, USP 99.95% OPTIME
TOPICAL | Status: DC | PRN
  Administered 2022-05-06: 450 mL

## 2022-05-06 MED ORDER — SODIUM CHLORIDE 0.9 % IV SOLN: INTRAVENOUS | Status: DC

## 2022-05-06 MED ORDER — BUPIVACAINE-EPINEPHRINE (PF) 0.5% -1:200000 IJ SOLN
INTRAMUSCULAR | Status: DC | PRN
  Administered 2022-05-06: 15 mL via PERINEURAL

## 2022-05-06 MED ORDER — DIPHENHYDRAMINE HCL 25 MG PO TABS: 25.0000 mg | ORAL_TABLET | Freq: Four times a day (QID) | ORAL | Status: DC | PRN

## 2022-05-06 MED ORDER — ROCURONIUM BROMIDE 10 MG/ML (PF) SYRINGE
PREFILLED_SYRINGE | INTRAVENOUS | Status: DC | PRN
  Administered 2022-05-06: 60 mg via INTRAVENOUS

## 2022-05-06 MED ORDER — HEPARIN SODIUM (PORCINE) 1000 UNIT/ML IJ SOLN
INTRAMUSCULAR | Status: AC
  Filled 2022-05-06: qty 5

## 2022-05-06 MED ORDER — ORAL CARE MOUTH RINSE: 15.0000 mL | Freq: Once | OROMUCOSAL | Status: AC

## 2022-05-06 MED ORDER — ROCURONIUM BROMIDE 10 MG/ML (PF) SYRINGE
PREFILLED_SYRINGE | INTRAVENOUS | Status: AC
  Filled 2022-05-06: qty 10

## 2022-05-06 MED ORDER — MENTHOL 3 MG MT LOZG: 1.0000 | LOZENGE | OROMUCOSAL | Status: DC | PRN

## 2022-05-06 MED ORDER — METOCLOPRAMIDE HCL 5 MG/ML IJ SOLN: 5.0000 mg | Freq: Three times a day (TID) | INTRAMUSCULAR | Status: DC | PRN

## 2022-05-06 MED ORDER — MIDAZOLAM HCL 2 MG/2ML IJ SOLN
INTRAMUSCULAR | Status: AC
  Filled 2022-05-06: qty 2

## 2022-05-06 MED ORDER — OXYCODONE HCL 5 MG PO TABS: 5.0000 mg | ORAL_TABLET | Freq: Once | ORAL | Status: DC | PRN

## 2022-05-06 SURGICAL SUPPLY — 88 items
AID PSTN UNV HD RSTRNT DISP (MISCELLANEOUS) ×1
ALCOHOL 70% 16 OZ (MISCELLANEOUS) ×2 IMPLANT
APL PRP STRL LF DISP 70% ISPRP (MISCELLANEOUS) ×2
AUG COMP REV MI TAPER ADAPTER (Joint) ×1 IMPLANT
AUGMENT COMP REV MI TAPR ADPTR (Joint) IMPLANT
BAG COUNTER SPONGE SURGICOUNT (BAG) ×2 IMPLANT
BAG SPNG CNTER NS LX DISP (BAG) ×1
BIT DRILL 2.7 W/STOP DISP (BIT) IMPLANT
BIT DRILL QUICK REL 1/8 2PK SL (DRILL) IMPLANT
BIT DRILL TWIST 2.7 (BIT) IMPLANT
BLADE SAW SGTL 13X75X1.27 (BLADE) ×2 IMPLANT
BRNG HUM +3 36 RVRS SHLDR (Shoulder) ×1 IMPLANT
BSPLAT GLND SM AUG TPR ADPR (Joint) ×1 IMPLANT
CHLORAPREP W/TINT 26 (MISCELLANEOUS) ×2 IMPLANT
COOLER ICEMAN CLASSIC (MISCELLANEOUS) ×2 IMPLANT
COVER SURGICAL LIGHT HANDLE (MISCELLANEOUS) ×2 IMPLANT
DRAPE INCISE IOBAN 66X45 STRL (DRAPES) ×2 IMPLANT
DRAPE U-SHAPE 47X51 STRL (DRAPES) ×4 IMPLANT
DRILL QUICK RELEASE 1/8 INCH (DRILL) ×1
DRSG AQUACEL AG ADV 3.5X10 (GAUZE/BANDAGES/DRESSINGS) ×2 IMPLANT
ELECT BLADE 4.0 EZ CLEAN MEGAD (MISCELLANEOUS) ×1
ELECT REM PT RETURN 9FT ADLT (ELECTROSURGICAL) ×1
ELECTRODE BLDE 4.0 EZ CLN MEGD (MISCELLANEOUS) ×2 IMPLANT
ELECTRODE REM PT RTRN 9FT ADLT (ELECTROSURGICAL) ×2 IMPLANT
GAUZE SPONGE 4X4 12PLY STRL LF (GAUZE/BANDAGES/DRESSINGS) ×2 IMPLANT
GLENOID SPHERE 36MM CVD +3 (Orthopedic Implant) IMPLANT
GLOVE BIOGEL PI IND STRL 7.0 (GLOVE) ×2 IMPLANT
GLOVE BIOGEL PI IND STRL 8 (GLOVE) ×2 IMPLANT
GLOVE ECLIPSE 7.0 STRL STRAW (GLOVE) ×2 IMPLANT
GLOVE ECLIPSE 8.0 STRL XLNG CF (GLOVE) ×2 IMPLANT
GOWN STRL REUS W/ TWL LRG LVL3 (GOWN DISPOSABLE) ×2 IMPLANT
GOWN STRL REUS W/ TWL XL LVL3 (GOWN DISPOSABLE) ×2 IMPLANT
GOWN STRL REUS W/TWL LRG LVL3 (GOWN DISPOSABLE) ×1
GOWN STRL REUS W/TWL XL LVL3 (GOWN DISPOSABLE) ×1
GUIDE MODEL REV SHLD LT (ORTHOPEDIC DISPOSABLE SUPPLIES) IMPLANT
HYDROGEN PEROXIDE 16OZ (MISCELLANEOUS) ×2 IMPLANT
JET LAVAGE IRRISEPT WOUND (IRRIGATION / IRRIGATOR) ×1
KIT BASIN OR (CUSTOM PROCEDURE TRAY) ×2 IMPLANT
KIT TURNOVER KIT B (KITS) ×2 IMPLANT
LAVAGE JET IRRISEPT WOUND (IRRIGATION / IRRIGATOR) ×2 IMPLANT
LOOP VESSEL MAXI BLUE (MISCELLANEOUS) ×2 IMPLANT
MANIFOLD NEPTUNE II (INSTRUMENTS) ×2 IMPLANT
NDL SUT 6 .5 CRC .975X.05 MAYO (NEEDLE) IMPLANT
NDL TAPERED W/ NITINOL LOOP (MISCELLANEOUS) ×2 IMPLANT
NEEDLE MAYO TAPER (NEEDLE)
NEEDLE TAPERED W/ NITINOL LOOP (MISCELLANEOUS) ×1 IMPLANT
NS IRRIG 1000ML POUR BTL (IV SOLUTION) ×2 IMPLANT
PACK SHOULDER (CUSTOM PROCEDURE TRAY) ×2 IMPLANT
PAD ARMBOARD 7.5X6 YLW CONV (MISCELLANEOUS) ×4 IMPLANT
PAD COLD SHLDR WRAP-ON (PAD) ×2 IMPLANT
PASSER SUT SWANSON 36MM LOOP (INSTRUMENTS) ×2 IMPLANT
PIN HUMERAL STMN 3.2MMX9IN (INSTRUMENTS) IMPLANT
PIN THREADED REVERSE (PIN) IMPLANT
REAMER GUIDE BUSHING SURG DISP (MISCELLANEOUS) IMPLANT
REAMER GUIDE W/SCREW AUG (MISCELLANEOUS) IMPLANT
RESTRAINT HEAD UNIVERSAL NS (MISCELLANEOUS) ×2 IMPLANT
RETRIEVER SUT HEWSON (MISCELLANEOUS) IMPLANT
SCREW BONE LOCKING 4.75X35X3.5 (Screw) IMPLANT
SCREW BONE STRL 6.5MMX30MM (Screw) IMPLANT
SCREW LOCKING 4.75MMX15MM (Screw) IMPLANT
SCREW LOCKING NS 4.75MMX20MM (Screw) IMPLANT
SLING ARM IMMOBILIZER LRG (SOFTGOODS) ×2 IMPLANT
SLING ARM IMMOBILIZER XL (CAST SUPPLIES) IMPLANT
SOL PREP POV-IOD 4OZ 10% (MISCELLANEOUS) ×2 IMPLANT
SPONGE T-LAP 18X18 ~~LOC~~+RFID (SPONGE) ×2 IMPLANT
STEM MINI LONG 14MM 83MM (Stem) IMPLANT
STRIP CLOSURE SKIN 1/2X4 (GAUZE/BANDAGES/DRESSINGS) ×2 IMPLANT
SUCTION FRAZIER HANDLE 10FR (MISCELLANEOUS) ×1
SUCTION TUBE FRAZIER 10FR DISP (MISCELLANEOUS) ×2 IMPLANT
SUT BROADBAND TAPE 2PK 1.5 (SUTURE) IMPLANT
SUT FIBERWIRE #2 38 T-5 BLUE (SUTURE)
SUT MAXBRAID (SUTURE) IMPLANT
SUT MNCRL AB 3-0 PS2 18 (SUTURE) ×2 IMPLANT
SUT SILK 2 0 TIES 10X30 (SUTURE) ×2 IMPLANT
SUT VIC AB 0 CT1 27 (SUTURE) ×3
SUT VIC AB 0 CT1 27XBRD ANBCTR (SUTURE) ×8 IMPLANT
SUT VIC AB 1 CT1 27 (SUTURE) ×2
SUT VIC AB 1 CT1 27XBRD ANBCTR (SUTURE) ×4 IMPLANT
SUT VIC AB 2-0 CT1 27 (SUTURE) ×3
SUT VIC AB 2-0 CT1 TAPERPNT 27 (SUTURE) ×6 IMPLANT
SUT VICRYL 0 UR6 27IN ABS (SUTURE) ×4 IMPLANT
SUTURE FIBERWR #2 38 T-5 BLUE (SUTURE) IMPLANT
TOWEL GREEN STERILE (TOWEL DISPOSABLE) ×2 IMPLANT
TRAY FOL W/BAG SLVR 16FR STRL (SET/KITS/TRAYS/PACK) IMPLANT
TRAY FOLEY W/BAG SLVR 16FR LF (SET/KITS/TRAYS/PACK)
TRAY HUM REV SHOULDER 36 +3 (Shoulder) IMPLANT
TRAY HUM REV SHOULDER STD +6 (Shoulder) IMPLANT
WATER STERILE IRR 1000ML POUR (IV SOLUTION) ×2 IMPLANT

## 2022-05-06 NOTE — Brief Op Note (Signed)
   05/06/2022  11:43 AM  PATIENT:  [name]  77 y.o. female  PRE-OPERATIVE DIAGNOSIS:  left shoulder osteoarthritis  POST-OPERATIVE DIAGNOSIS:  left shoulder osteoarthritis  PROCEDURE:  Procedure(s): LEFT REVERSE SHOULDER ARTHROPLASTY  SURGEON:  Surgeon(s): Marlou Sa, Tonna Corner, MD  ASSISTANT: Annie Main  ANESTHESIA:   General  EBL: 175 ml    Total I/O In: 1400 [I.V.:1200; IV Piggyback:200] Out: 175 [Blood:175]  BLOOD ADMINISTERED: none  DRAINS: None  LOCAL MEDICATIONS USED: Vancomycin powder  SPECIMEN:  No Specimen  COUNTS:  YES  TOURNIQUET:  * No tourniquets in log *  DICTATION: .Other Dictation: Dictation Number [id number]  PLAN OF CARE: Admit for overnight observation  PATIENT DISPOSITION:  PACU - hemodynamically stable

## 2022-05-06 NOTE — Op Note (Unsigned)
NAMEHAILEYANN, Mullins MEDICAL RECORD NO: [id number] ACCOUNT NO: [id number] DATE OF BIRTH: Mar 21, 1946 FACILITY: [facility] LOCATION: [facility] PHYSICIAN: Yetta Barre. Marlou Sa, MD  Operative Report   DATE OF PROCEDURE: 05/06/2022  PREOPERATIVE DIAGNOSIS:  Left shoulder arthritis.  POSTOPERATIVE DIAGNOSIS:  Left shoulder arthritis.  PROCEDURE:  Left reverse shoulder replacement using Biomet components, size 14 humeral stem, mini humeral tray with +6 taper offset, 40 mm in diameter with +3 thickness highly cross-linked polyethylene bearing and glenosphere 36+3.  SURGEON:  Yetta Barre. Marlou Sa, MD.  ASSISTANT:  Annie Main, PA.  INDICATIONS:  [name] is a 77 year old patient who did well with her right reverse replacement.  She has left shoulder pain as well and presents now for left reverse replacement after explanation of risks and benefits.  DESCRIPTION OF PROCEDURE:  The patient was brought to the operating room where general anesthetic was induced.  Preoperative antibiotics administered.  Timeout was called.  Right shoulder, arm and hand prescrubbed with first hydrogen peroxide followed by  alcohol then Betadine, which was allowed to air dry.  Then, the shoulder was prepped with ChloraPrep solution and draped in sterile manner.  Ioban used to seal the operative field and cover the operative field.  Head was in neutral position.  After  calling timeout, deltopectoral approach was made.  Skin and subcutaneous tissue sharply divided.  IrriSept solution utilized at this time.  Next cephalic vein was mobilized medially.  The interval between the deltoid and pectoralis muscles was developed.   Crossing veins ligated.  At this time, axillary nerve was palpated and protected at all times during the case.  Next, the deltoid was elevated manually off its anterior attachment and subacromial and subdeltoid adhesions were manually broken up to  establish a very clean tissue plane between the rotator cuff and the  deltoid.  Next, the superior 1.5 cm of the pectoralis tendon was released.  Biceps tendon was then tenodesed to the pec tendon using 5-0 Vicryl sutures.  The biceps tendon was then used  to open up the rotator interval.  The circumflex vessels were then ligated.  Then, the subscapularis was detached using a 15 blade from the lesser tuberosity with 2 traction sutures applied.  This was taken around circumferentially to the 7 o'clock  position.  About 2 cm of the capsule was also released with the Cobb elevator around the inferior humeral neck with care being taken to avoid injury to neurovascular structures.  Osteophytes also removed.  The subscapularis tendon had significant  tendinopathy, but remained intact.  The tendinopathy portions were debrided.  Next, the head was dislocated.  Supraspinatus also had significant tendinopathy, but no full thickness tears, the humeral head was then dislocated and a Browne retractor  placed.  Reverse retractor was then placed as well and the head was then reamed up to size 14.  Humeral head cut was made in 30 degrees of retroversion, which matched the patient's native version.  Osteophytes were removed.  Broaching then performed up  to a size 14, which gave a very nice fit.  Next, the cap was placed over the 14 stem.  Attention then directed towards the glenoid.  Circumferential release of the subscapularis tendon was performed, particularly involving the rotator interval to the  base of the coracoid.  Posterior retractor placed, anterior retractor placed.  Bankart lesion created from 12 o'clock to 6 o'clock and the labrum was carefully excised as well using electrocautery.  Next, a guide was placed onto the glenoid.  The  drill  pins were placed and reaming performed.  A superior reaming was then performed in accordance with preoperative templating.  We only reamed about 4 mm off the inferior glenoid region.  Excellent fit was obtained with the trial implant.  The true  implant  was then placed with 1 central compression screw, which obtained very good purchase and 3 peripheral locking screws.  Next, a trial reduction was performed with the +3 36 glenoid with about 2.5 mm of inferior offset.  We trialed that with the +6 offset  with standard polyethylene and +3 polyethylene.  In general, the +3 polyethylene insert on the humeral side along with the +6 offset tray gave excellent stability.  This was with extension and abduction along with internal and external rotation as well  as range of motion above head.  With this construct and reduction and dislocation were both difficult, but achievable.  Trial components were removed.  Thorough irrigation was performed.  Six suture tapes placed into the lesser tuberosity using drill  bit.  IrriSept solution placed into the humeral canal.  The true glenosphere +3 offset was then placed with the offset 2.5 mm inferiorly placed.  The stem was then placed after placing Vancomycin powder in the canal after removing the IrriSept solution.   Next, the trial reduction was again performed and the +3 polyethylene spacer gave excellent stability.  This is the one chosen and was tapped into position on the [place] taper.  Same stability parameters were maintained.  Thorough irrigation then  performed.  Axillary nerve again palpated and was intact.  IrriSept solution utilized.  The 4 liters of irrigating solution utilized and then, IrriSept solution was utilized.  At this time, the subscapularis was then reattached to the lesser tuberosity  using Nice knots x6 suture tapes with the arm in 30 degrees of external rotation.  Prior to rotator interval closure the joint itself was irrigated with IrriSept solution, vancomycin powder placed on the implants.  Rotator interval was then closed using  #1 Vicryl suture with the arm in 30 degrees of external rotation.  Next, the deltopectoral interval was then closed using a #1 Vicryl suture followed by  interrupted inverted 0 Vicryl suture, 2-0 Vicryl suture, and 3-0 Monocryl with Steri-Strips and  Aquacel dressing applied.  The patient tolerated the procedure well without immediate complication.  Shoulder immobilizer placed.  Luke's assistance was required at all times for retraction, opening, closing, mobilization of tissue.  His assistance was a  medical necessity.   PUS D: 05/06/2022 11:52:48 am T: 05/06/2022 12:20:00 pm  JOB: [id number]/ [id number]

## 2022-05-06 NOTE — Anesthesia Procedure Notes (Signed)
Anesthesia Regional Block: Interscalene brachial plexus block   Pre-Anesthetic Checklist: , timeout performed,  Correct Patient, Correct Site, Correct Laterality,  Correct Procedure, Correct Position, site marked,  Risks and benefits discussed,  Pre-op evaluation,  At surgeon's request and post-op pain management  Laterality: Left  Prep: Maximum Sterile Barrier Precautions used, chloraprep       Needles:  Injection technique: Single-shot  Needle Type: Echogenic Stimulator Needle     Needle Length: 4cm  Needle Gauge: 22     Additional Needles:   Procedures:,,,, ultrasound used (permanent image in chart),,    Narrative:  Start time: 05/06/2022 7:13 AM End time: 05/06/2022 7:16 AM Injection made incrementally with aspirations every 5 mL.  Performed by: Personally  Anesthesiologist: Brennan Laswell, MD  Additional Notes: Risks, benefits, and alternative discussed. Patient gave consent for procedure. Patient prepped and draped in sterile fashion. Sedation administered, patient remains easily responsive to voice. Relevant anatomy identified with ultrasound guidance. Local anesthetic given in 5cc increments with no signs or symptoms of intravascular injection. No pain or paraesthesias with injection. Patient monitored throughout procedure with signs of LAST or immediate complications. Tolerated well. Ultrasound image placed in chart.  Tawny Asal, MD

## 2022-05-06 NOTE — Anesthesia Postprocedure Evaluation (Signed)
Anesthesia Post Note  Patient: [name]  Procedure(s) Performed: LEFT REVERSE SHOULDER ARTHROPLASTY (Left: Shoulder)     Patient location during evaluation: PACU Anesthesia Type: General Level of consciousness: awake and alert Pain management: pain level controlled Vital Signs Assessment: post-procedure vital signs reviewed and stable Respiratory status: spontaneous breathing, nonlabored ventilation and respiratory function stable Cardiovascular status: blood pressure returned to baseline Postop Assessment: no apparent nausea or vomiting Anesthetic complications: no   No notable events documented.  Last Vitals:  Vitals:   05/06/22 1240 05/06/22 1311  BP: (!) 147/96 (!) 163/97  Pulse: 70 78  Resp: 11 18  Temp: 36.5 C 36.4 C  SpO2: 96% 92%    Last Pain:  Vitals:   05/06/22 1240  PainSc: Asleep                 Marthenia Rolling

## 2022-05-06 NOTE — Anesthesia Procedure Notes (Addendum)
Procedure Name: Intubation Date/Time: 05/06/2022 8:50 AM  Performed by: Michele Rockers, CRNAPre-anesthesia Checklist: Patient identified, Patient being monitored, Timeout performed, Emergency Drugs available and Suction available Patient Re-evaluated:Patient Re-evaluated prior to induction Oxygen Delivery Method: Circle System Utilized Preoxygenation: Pre-oxygenation with 100% oxygen Induction Type: IV induction Ventilation: Mask ventilation without difficulty and Oral airway inserted - appropriate to patient size Laryngoscope Size: Sabra Heck and 2 Grade View: Grade I Tube type: Oral Tube size: 7.0 mm Number of attempts: 1 Airway Equipment and Method: Stylet Placement Confirmation: ETT inserted through vocal cords under direct vision, positive ETCO2 and breath sounds checked- equal and bilateral Secured at: 21 cm Tube secured with: Tape Dental Injury: Teeth and Oropharynx as per pre-operative assessment

## 2022-05-06 NOTE — Transfer of Care (Signed)
Immediate Anesthesia Transfer of Care Note  Patient: [name]  Procedure(s) Performed: LEFT REVERSE SHOULDER ARTHROPLASTY (Left: Shoulder)  Patient Location: PACU  Anesthesia Type:GA combined with regional for post-op pain  Level of Consciousness: awake, alert , oriented, and patient cooperative  Airway & Oxygen Therapy: Patient Spontanous Breathing and Patient connected to face mask oxygen  Post-op Assessment: Report given to RN and Post -op Vital signs reviewed and stable  Post vital signs: Reviewed and stable  Last Vitals:  Vitals Value Taken Time  BP 149/90 05/06/22 1140  Temp 36.4 C 05/06/22 1140  Pulse 72 05/06/22 1140  Resp 18 05/06/22 1140  SpO2 93 % 05/06/22 1140  Vitals shown include unvalidated device data.  Last Pain:  Vitals:   05/06/22 0618  PainSc: 6          Complications: No notable events documented.

## 2022-05-06 NOTE — H&P (Signed)
[name] is an 77 y.o. female.   Chief Complaint: Left shoulder pain HPI: [name] is a 76 year old patient with left shoulder pain. Reports constant pain with locking and pain wakes her from sleep at night. She has done well with her right shoulder replacement. Had right RSA on 421. 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 Hemoglobin A1c around 8. She takes insulin nightly. She states that the injections did not help her right shoulder prior to surgery.   Past Medical History:  Diagnosis Date   Anxiety    Arthritis    osteoarthritis left knee, right shoulder "locks" occ.   Diabetes mellitus without complication ([name])    H/O seasonal allergies    History of hiatal hernia    Hyperlipidemia    Hypertension    Lipoma of buttock    Pneumonia    as a child   Sleep apnea    no cpap    Past Surgical History:  Procedure Laterality Date   ABDOMINAL HYSTERECTOMY     ovaries still intact   BACK SURGERY     lumbar fusion   CARPAL TUNNEL RELEASE Right    CATARACT EXTRACTION, BILATERAL     CHOLECYSTECTOMY     COLONOSCOPY WITH PROPOFOL N/A 07/30/2017   Procedure: COLONOSCOPY WITH PROPOFOL;  Surgeon: Juanita Craver, MD;  Location: [facility];  Service: Endoscopy;  Laterality: N/A;   ELBOW SURGERY Right    EXPLORATORY LAPAROTOMY     ovaries freed from intestines.   FOOT SURGERY     retained hardware.   KNEE ARTHROSCOPY Right    scope surgery   LIPOMA EXCISION Left 11/19/2021   Procedure: EXCISION OF LEFT UPPER BUTTOCK LIPOMA;  Surgeon: Greer Pickerel, MD;  Location: [facility];  Service: General;  Laterality: Left;   TONSILLECTOMY     TOTAL KNEE ARTHROPLASTY Left 02/16/2015   Procedure: LEFT TOTAL KNEE ARTHROPLASTY;  Surgeon: Sydnee Cabal, MD;  Location: [facility];  Service: Orthopedics;  Laterality: Left;   TOTAL SHOULDER ARTHROPLASTY Right 07/26/2019   Procedure: RIGHT TOTAL SHOULDER ARTHROPLASTY;  Surgeon: Meredith Pel, MD;  Location: [facility];  Service: Orthopedics;  Laterality: Right;   WRIST SURGERY Left    x2 "cysts"    Family History  Family history unknown: Yes   Social History:  reports that she quit smoking about 49 years ago. Her smoking use included cigarettes. She has a 5.00 pack-year smoking history. She has never used smokeless tobacco. She reports that she does not currently use alcohol. She reports that she does not use drugs.  Allergies:  Allergies  Allergen Reactions   Prednisone Shortness Of Breath   Metformin Nausea And Vomiting    Too much diarrhea     Medications Prior to Admission  Medication Sig Dispense Refill   acetaminophen (TYLENOL) 500 MG tablet Take 2,000 mg by mouth daily as needed for moderate pain.     aspirin EC 81 MG tablet Take 81 mg by mouth daily. Swallow whole.     buPROPion ER (WELLBUTRIN SR) 100 MG 12 hr tablet Take 100 mg by mouth every morning.     Carboxymethylcellul-Glycerin (LUBRICATING EYE DROPS OP) Place 1 drop into both eyes daily as needed (dry eyes).     ciprofloxacin (CIPRO) 500 MG tablet Take 1 tablet (500 mg total) by mouth 2 (two) times daily for 3 days. 6 tablet 0   diphenhydrAMINE (BENADRYL) 25 MG tablet Take 25  mg by mouth every 6 (six) hours as needed for allergies.     glipiZIDE (GLUCOTROL XL) 10 MG 24 hr tablet Take 10 mg by mouth daily.     Insulin Glargine (LANTUS SOLOSTAR) 100 UNIT/ML Solostar Pen Inject 40 Units into the skin at bedtime.     lisinopril (ZESTRIL) 10 MG tablet Take 10 mg by mouth daily.     meloxicam (MOBIC) 15 MG tablet Take 15 mg by mouth daily.     OVER THE COUNTER MEDICATION Take 1 capsule by mouth daily. CBD gummies     rosuvastatin (CRESTOR) 10 MG tablet Take 10 mg by mouth daily.      Results for orders placed or performed during the hospital encounter of 05/06/22 (from the past 48 hour(s))  Glucose, capillary     Status: Abnormal   Collection Time: 05/06/22  5:45 AM  Result Value Ref Range    Glucose-Capillary 179 (H) 70 - 99 mg/dL    Comment: Glucose reference range applies only to samples taken after fasting for at least 8 hours.   No results found.  Review of Systems  Musculoskeletal:  Positive for arthralgias.  All other systems reviewed and are negative.   Blood pressure (!) 157/98, pulse 63, temperature 98 F (36.7 C), resp. rate 18, height '5\' 7"'$  (1.702 m), weight 131.5 kg, SpO2 97 %. Physical Exam Vitals reviewed.  HENT:     Head: Normocephalic.     Nose: Nose normal.     Mouth/Throat:     Mouth: Mucous membranes are moist.  Cardiovascular:     Rate and Rhythm: Normal rate.     Pulses: Normal pulses.  Pulmonary:     Effort: Pulmonary effort is normal.  Abdominal:     General: Abdomen is flat.  Musculoskeletal:     Cervical back: Normal range of motion.  Skin:    General: Skin is warm.     Capillary Refill: Capillary refill takes less than 2 seconds.  Neurological:     General: No focal deficit present.     Mental Status: She is alert.    Left shoulder range of motion is 25/85/130. Deltoid is functional. Does have pain coarseness and grinding with passive range of motion of that left shoulder. Motor or sensory function of the hand is intact. Cervical spine range of motion also intact.   Assessment/Plan Impression is left shoulder arthritis end-stage with night pain as well as pain with ADLs.  Patient has done with right reverse shoulder replacement.  Plan for [name] today is left reverse shoulder replacement.  The risk and benefits are discussed with the patient including not limited to infection nerve and vessel damage instability as well as incomplete pain relief and incomplete functional restoration.  Patient understands the risk and benefits and wishes to proceed.  Medically she has been optimized prior to surgical intervention.  All questions answered  Anderson Malta, MD 05/06/2022, 6:46 AM

## 2022-05-07 ENCOUNTER — Encounter (HOSPITAL_COMMUNITY): Payer: Self-pay | Admitting: Orthopedic Surgery

## 2022-05-07 DIAGNOSIS — M19012 Primary osteoarthritis, left shoulder: Secondary | ICD-10-CM | POA: Diagnosis not present

## 2022-05-07 LAB — GLUCOSE, CAPILLARY: Glucose-Capillary: 155 mg/dL — ABNORMAL HIGH (ref 70–99)

## 2022-05-07 MED ORDER — METHOCARBAMOL 500 MG PO TABS: 500.0000 mg | ORAL_TABLET | Freq: Three times a day (TID) | ORAL | 0 refills | Status: AC | PRN

## 2022-05-07 MED ORDER — OXYCODONE HCL 5 MG PO TABS: 5.0000 mg | ORAL_TABLET | ORAL | 0 refills | Status: AC | PRN

## 2022-05-07 MED ORDER — DOCUSATE SODIUM 100 MG PO CAPS: 100.0000 mg | ORAL_CAPSULE | Freq: Two times a day (BID) | ORAL | 0 refills | Status: AC

## 2022-05-07 NOTE — Progress Notes (Signed)
OT Evaluation  Patient evaluated by Occupational Therapy with no further acute OT needs identified. All education has been completed and the patient has no further questions. Pt educated on pendulum exercise and return demonstrates. Pt with prior surgery received home services and will not have transportation for outpatient. See below for any follow-up Occupational Therapy or equipment needs. OT to sign off. Thank you for referral.     05/07/22 0600  OT Visit Information  Last OT Received On 05/07/22  History of Present Illness 77 yo female s/p 05/06/22 L reverse TSA PMH :DM, asleep Apnea, Lumbar fusion, L TKA, R TSA 4/21, anxiety, HLD, lipoma of buttock, R elbow surgery, wrist surgery L  Precautions  Precautions Shoulder  Shoulder Interventions At all times;Shoulder sling/immobilizer  Precaution Comments provided shoulder handout and elbow wrist hand handout, pendulum handout  Restrictions  Weight Bearing Restrictions Yes  LUE Weight Bearing NWB  Home Living  Family/patient expects to be discharged to: Private residence  Living Arrangements Parent (14 yo mother)  Available Help at Discharge Family;Available PRN/intermittently  Type of Home Apartment  Home Access Level entry  Home Layout Able to live on main level with bedroom/bathroom;One level  Bathroom Therapist, music BSC/3in1 (electric bed that goes up and down, reports she has a shower seat but the tub is too narrow for it to fit)  Additional Comments 28 yo mother is on hospice services and pt must care for her. currently a cousin has mother in her care  Prior Function  Prior Level of Function  Independent/Modified Independent  Communication  Communication No difficulties  Pain Assessment  Pain Assessment Faces  Faces Pain Scale 2  Pain Location shoulder  Pain Descriptors / Indicators Discomfort  Pain Intervention(s) Monitored during session;Premedicated before session;Ice  applied  Cognition  Arousal/Alertness Awake/alert  Behavior During Therapy WFL for tasks assessed/performed  Overall Cognitive Status Within Functional Limits for tasks assessed  Upper Extremity Assessment  Upper Extremity Assessment LUE deficits/detail  LUE Deficits / Details sling- education given on sling application and wear schedule. Active hand wrist and elbow  Lower Extremity Assessment  Lower Extremity Assessment Overall WFL for tasks assessed (pt reports baseline soreness to knees)  Cervical / Trunk Assessment  Cervical / Trunk Assessment Back Surgery  Vision- History  Baseline Vision/History 0 No visual deficits  Ability to See in Adequate Light 0 Adequate  Patient Visual Report No change from baseline  Vision- Assessment  Additional Comments wears glasses for reading only  ADL  Overall ADL's  Needs assistance/impaired  Eating/Feeding Independent  Grooming Independent  Upper Body Bathing Min guard  Lower Body Bathing Minimal assistance  Upper Body Dressing  Min guard  Lower Body Dressing Minimal assistance  Toilet Transfer Min guard  Bed Mobility  Overal bed mobility Independent  Transfers  Overall transfer level Independent  Balance  Overall balance assessment Mild deficits observed, not formally tested  General Comments  General comments (skin integrity, edema, etc.) incision dry and the corner is partially dc as pt was pulling on it. pt educate and photo take with personal photo to give visual educaiton to the patient of dressing. pt reports "it itches"  Exercises  Exercises Shoulder  Shoulder Instructions  Donning/doffing shirt without moving shoulder Min-guard  Method for sponge bathing under operated UE Minimal assistance  Donning/doffing sling/immobilizer Min-guard  Correct positioning of sling/immobilizer Independent  Pendulum exercises (written home exercise program) Min-guard  ROM for elbow, wrist and digits of  operated UE Independent  Sling wearing  schedule (on at all times/off for ADL's) Independent  Proper positioning of operated UE when showering Independent  Positioning of UE while sleeping Min-guard  Shoulder Exercises  Pendulum Exercise PROM;Left;10 reps;Standing  OT - End of Session  Activity Tolerance Patient tolerated treatment well  Patient left in chair;with call bell/phone within reach  Nurse Communication Mobility status;Precautions;Weight bearing status  OT Assessment  OT Recommendation/Assessment All further OT needs can be met in the next venue of care  OT Visit Diagnosis Unsteadiness on feet (R26.81)  AM-PAC OT "6 Clicks" Daily Activity Outcome Measure (Version 2)  Help from another person eating meals? 4  Help from another person taking care of personal grooming? 4  Help from another person toileting, which includes using toliet, bedpan, or urinal? 3  Help from another person bathing (including washing, rinsing, drying)? 3  Help from another person to put on and taking off regular upper body clothing? 3  Help from another person to put on and taking off regular lower body clothing? 3  6 Click Score 20  Progressive Mobility  What is the highest level of mobility based on the progressive mobility assessment? Level 5 (Walks with assist in room/hall) - Balance while stepping forward/back and can walk in room with assist - Complete  Mobility Referral Yes  Activity Ambulated with assistance in room;Ambulated with assistance to bathroom  OT Recommendation  Follow Up Recommendations Follow physician's recommendations for discharge plan and follow up therapies (pt reports R UE having home services in [place]. Pt requesting [facility] based services. pt reports she will have to drive herself to follow ups)  Assistance recommended at discharge PRN  OT Equipment None recommended by OT  Acute Rehab OT Goals  Patient Stated Goal to get better  OT Time Calculation  OT Start Time (ACUTE ONLY) 0808  OT Stop Time (ACUTE  ONLY) 0836  OT Time Calculation (min) 28 min  OT General Charges  $OT Visit 1 Visit  OT Evaluation  $OT Eval Moderate Complexity 1 Mod  Written Expression  Dominant Hand Right   Brynn, OTR/L  Acute Rehabilitation Services Office: [phone] .

## 2022-05-07 NOTE — Plan of Care (Signed)
Pt doing well. Pt given D/C instructions with verbal understanding. Rx's were sent to the pharmacy by MD. Pt's incision is clean and dry with no sign of infection. Pt's IV was removed prior to D/C. Pt D/C'd home via wheelchair per MD order. Pt is stable @ D/C and has no other needs at this time. Jauan Wohl, RN  

## 2022-05-08 ENCOUNTER — Other Ambulatory Visit: Payer: Self-pay | Admitting: Surgical

## 2022-05-08 LAB — URINE CULTURE: Culture: >=100000 — AB

## 2022-05-08 MED ORDER — CIPROFLOXACIN HCL 500 MG PO TABS: 500.0000 mg | ORAL_TABLET | Freq: Two times a day (BID) | ORAL | 0 refills | Status: AC

## 2022-05-08 MED ORDER — CIPROFLOXACIN HCL 500 MG PO TABS: 500.0000 mg | ORAL_TABLET | Freq: Two times a day (BID) | ORAL | 0 refills | Status: DC

## 2022-05-08 NOTE — Progress Notes (Signed)
I sent in antibiotics and I called her

## 2022-05-08 NOTE — Progress Notes (Signed)
Can you call her and send her in some abx It doesn't look like she took the others thx

## 2022-05-08 NOTE — Progress Notes (Signed)
thx

## 2022-05-11 DIAGNOSIS — M7522 Bicipital tendinitis, left shoulder: Secondary | ICD-10-CM

## 2022-05-15 NOTE — Discharge Summary (Signed)
Physician Discharge Summary      Patient ID: [name] MRN: [id number] DOB/AGE: Nov 17, 1945 77 y.o.  Admit date: 05/06/2022 Discharge date: 05/03/2022  Admission Diagnoses:  Principal Problem:   OA (osteoarthritis) of shoulder Active Problems:   S/P reverse total shoulder arthroplasty, left   Biceps tendonitis on left   Discharge Diagnoses:  Same  Surgeries: Procedure(s): LEFT REVERSE SHOULDER ARTHROPLASTY on 05/06/2022   Consultants:   Discharged Condition: Stable  Hospital Course: JANENE YOUSUF is an 77 y.o. female who was admitted 05/06/2022 with a chief complaint of left shoulder pain, and found to have a diagnosis of left shoulder osteoarthritis.  They were brought to the operating room on 05/06/2022 and underwent the above named procedures.  Pt awoke from anesthesia without complication and was transferred to the floor. On POD1, patient's pain was controlled.  She had no red flag symptoms.  She was able to ambulate around her room without difficulty.  Worked with occupational therapy and they had no concerns.  She was discharged home on POD 1..  Pt will f/u with Dr. Marlou Sa in clinic in ~2 weeks.   Antibiotics given:  Anti-infectives (From admission, onward)    Start     Dose/Rate Route Frequency Ordered Stop   05/06/22 1400  ceFAZolin (ANCEF) IVPB 2g/100 mL premix        2 g 200 mL/hr over 30 Minutes Intravenous Every 8 hours 05/06/22 1303 05/07/22 0535   05/06/22 0822  vancomycin (VANCOCIN) powder  Status:  Discontinued          As needed 05/06/22 0822 05/06/22 1133   05/06/22 0600  ceFAZolin (ANCEF) IVPB 3g/100 mL premix        3 g 200 mL/hr over 30 Minutes Intravenous On call to O.R. 05/06/22 8003 05/06/22 0810     .  Recent vital signs:  Vitals:   05/07/22 0325 05/07/22 0720  BP: 137/68 133/77  Pulse: 79 75  Resp: 18 16  Temp: 98.4 F (36.9 C) 98.3 F (36.8 C)  SpO2: 97% 96%    Recent laboratory studies:  Results for orders placed or performed  during the hospital encounter of 05/06/22  Urine Culture   Specimen: Urine, In & Out Cath  Result Value Ref Range   Specimen Description IN/OUT CATH URINE    Special Requests      NONE Performed at [facility], [address] [place], [place] [address]    Culture >=100,000 COLONIES/mL ESCHERICHIA COLI (A)    Report Status 05/08/2022 FINAL    Organism ID, Bacteria ESCHERICHIA COLI (A)       Susceptibility   Escherichia coli - MIC*    AMPICILLIN >=32 RESISTANT Resistant     CEFAZOLIN <=4 SENSITIVE Sensitive     CEFEPIME <=0.12 SENSITIVE Sensitive     CEFTRIAXONE <=0.25 SENSITIVE Sensitive     CIPROFLOXACIN <=0.25 SENSITIVE Sensitive     GENTAMICIN <=1 SENSITIVE Sensitive     IMIPENEM <=0.25 SENSITIVE Sensitive     NITROFURANTOIN <=16 SENSITIVE Sensitive     TRIMETH/SULFA <=20 SENSITIVE Sensitive     AMPICILLIN/SULBACTAM >=32 RESISTANT Resistant     PIP/TAZO <=4 SENSITIVE Sensitive     * >=100,000 COLONIES/mL ESCHERICHIA COLI  Glucose, capillary  Result Value Ref Range   Glucose-Capillary 179 (H) 70 - 99 mg/dL  Glucose, capillary  Result Value Ref Range   Glucose-Capillary 204 (H) 70 - 99 mg/dL  Glucose, capillary  Result Value Ref Range   Glucose-Capillary 263 (H)  70 - 99 mg/dL  Glucose, capillary  Result Value Ref Range   Glucose-Capillary 190 (H) 70 - 99 mg/dL   Comment 1 Notify RN    Comment 2 Document in Chart   Glucose, capillary  Result Value Ref Range   Glucose-Capillary 155 (H) 70 - 99 mg/dL   Comment 1 Notify RN    Comment 2 Document in Chart     Discharge Medications:   Allergies as of 05/07/2022       Reactions   Prednisone Shortness Of Breath   Metformin Nausea And Vomiting   Too much diarrhea         Medication List     STOP taking these medications    ciprofloxacin 500 MG tablet Commonly known as: Cipro       TAKE these medications    acetaminophen 500 MG tablet Commonly known as: TYLENOL Take 2,000 mg by mouth daily as  needed for moderate pain.   aspirin EC 81 MG tablet Take 81 mg by mouth daily. Swallow whole.   buPROPion ER 100 MG 12 hr tablet Commonly known as: WELLBUTRIN SR Take 100 mg by mouth every morning.   diphenhydrAMINE 25 MG tablet Commonly known as: BENADRYL Take 25 mg by mouth every 6 (six) hours as needed for allergies.   docusate sodium 100 MG capsule Commonly known as: COLACE Take 1 capsule (100 mg total) by mouth 2 (two) times daily.   glipiZIDE 10 MG 24 hr tablet Commonly known as: GLUCOTROL XL Take 10 mg by mouth daily.   Lantus SoloStar 100 UNIT/ML Solostar Pen Generic drug: insulin glargine Inject 40 Units into the skin at bedtime.   lisinopril 10 MG tablet Commonly known as: ZESTRIL Take 10 mg by mouth daily.   LUBRICATING EYE DROPS OP Place 1 drop into both eyes daily as needed (dry eyes).   meloxicam 15 MG tablet Commonly known as: MOBIC Take 15 mg by mouth daily.   methocarbamol 500 MG tablet Commonly known as: ROBAXIN Take 1 tablet (500 mg total) by mouth every 8 (eight) hours as needed for muscle spasms.   OVER THE COUNTER MEDICATION Take 1 capsule by mouth daily. CBD gummies   oxyCODONE 5 MG immediate release tablet Commonly known as: Oxy IR/ROXICODONE Take 1 tablet (5 mg total) by mouth every 4 (four) hours as needed for moderate pain (pain score 4-6).   rosuvastatin 10 MG tablet Commonly known as: CRESTOR Take 10 mg by mouth daily.        Diagnostic Studies: DG Shoulder Left Port  Result Date: 05/06/2022 CLINICAL DATA:  Status post shoulder surgery EXAM: LEFT SHOULDER COMPARISON:  CT 01/29/2022 FINDINGS: Postsurgical changes of left reverse total shoulder arthroplasty. Normal alignment. No evidence of fracture. Expected soft tissue changes. IMPRESSION: Postsurgical changes of reverse left total shoulder arthroplasty. No evidence of immediate hardware complication. Electronically Signed   By: Maurine Simmering M.D.   On: 05/06/2022 12:48     Disposition: Discharge disposition: 01-Home or Self Care       Discharge Instructions     Call MD / Call 911   Complete by: As directed    If you experience chest pain or shortness of breath, CALL 911 and be transported to the hospital emergency room.  If you develope a fever above 101 F, pus (white drainage) or increased drainage or redness at the wound, or calf pain, call your surgeon's office.   Constipation Prevention   Complete by: As directed    Drink  plenty of fluids.  Prune juice may be helpful.  You may use a stool softener, such as Colace (over the counter) 100 mg twice a day.  Use MiraLax (over the counter) for constipation as needed.   Diet - low sodium heart healthy   Complete by: As directed    Discharge instructions   Complete by: As directed    You may shower, dressing is waterproof.  Do not bathe or soak the operative shoulder in a tub, pool.  Use the CPM machine 3 times a day for one hour each time, increasing the degrees of range of motion with each session.  No lifting with the operative shoulder. Continue use of the sling.  Follow-up with Dr. Marlou Sa in ~2 weeks on your given appointment date on 05/21/2022 at 10:15 AM.  We will remove your adhesive bandage at that time.    Dental Antibiotics:  In most cases prophylactic antibiotics for Dental procdeures after total joint surgery are not necessary.  Exceptions are as follows:  1. History of prior total joint infection  2. Severely immunocompromised (Organ Transplant, cancer chemotherapy, Rheumatoid biologic meds such as Dickinson)  3. Poorly controlled diabetes (A1C &gt; 8.0, blood glucose over 200)  If you have one of these conditions, contact your surgeon for an antibiotic prescription, prior to your dental procedure.   Increase activity slowly as tolerated   Complete by: As directed    Post-operative opioid taper instructions:   Complete by: As directed    POST-OPERATIVE OPIOID TAPER INSTRUCTIONS: It is  important to wean off of your opioid medication as soon as possible. If you do not need pain medication after your surgery it is ok to stop day one. Opioids include: Codeine, Hydrocodone(Norco, Vicodin), Oxycodone(Percocet, oxycontin) and hydromorphone amongst others.  Long term and even short term use of opiods can cause: Increased pain response Dependence Constipation Depression Respiratory depression And more.  Withdrawal symptoms can include Flu like symptoms Nausea, vomiting And more Techniques to manage these symptoms Hydrate well Eat regular healthy meals Stay active Use relaxation techniques(deep breathing, meditating, yoga) Do Not substitute Alcohol to help with tapering If you have been on opioids for less than two weeks and do not have pain than it is ok to stop all together.  Plan to wean off of opioids This plan should start within one week post op of your joint replacement. Maintain the same interval or time between taking each dose and first decrease the dose.  Cut the total daily intake of opioids by one tablet each day Next start to increase the time between doses. The last dose that should be eliminated is the evening dose.             SignedAnnie Main 05/15/2022, 8:32 AM

## 2022-05-21 ENCOUNTER — Telehealth: Payer: Self-pay | Admitting: Orthopedic Surgery

## 2022-05-21 ENCOUNTER — Encounter: Payer: Medicare PPO | Admitting: Orthopedic Surgery

## 2022-05-21 NOTE — Telephone Encounter (Signed)
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

## 2022-05-21 NOTE — Telephone Encounter (Signed)
Appt has been rescheduled

## 2022-05-23 ENCOUNTER — Ambulatory Visit (INDEPENDENT_AMBULATORY_CARE_PROVIDER_SITE_OTHER): Payer: No Typology Code available for payment source | Admitting: Surgical

## 2022-05-23 ENCOUNTER — Ambulatory Visit (INDEPENDENT_AMBULATORY_CARE_PROVIDER_SITE_OTHER): Payer: No Typology Code available for payment source

## 2022-05-23 DIAGNOSIS — G8929 Other chronic pain: Secondary | ICD-10-CM

## 2022-05-23 DIAGNOSIS — M25512 Pain in left shoulder: Secondary | ICD-10-CM | POA: Diagnosis not present

## 2022-05-24 ENCOUNTER — Encounter: Payer: Self-pay | Admitting: Surgical

## 2022-05-24 NOTE — Progress Notes (Signed)
Post-Op Visit Note   Patient: [name]           Date of Birth: [birth year]           MRN: [id number] Visit Date: 05/23/2022 PCP: Arthur Holms, NP   Assessment & Plan:  Chief Complaint:  Chief Complaint  Patient presents with   Left Shoulder - Routine Post Op   Visit Diagnoses:  1. Chronic left shoulder pain     Plan: [name] is a 77 y.o. female who presents s/p left reverse shoulder arthroplasty on 05/06/2022.  Patient is doing well and pain is overall controlled.  Denies any chest pain, SOB, fevers, chills.  No complaint of any instability symptoms.  Not really having to take pain medication significantly any longer.  Never really had any significant pain, just a little bit of soreness and discomfort.  She is very pleased by how she is feeling.  On exam, patient has range of motion 15 degrees X rotation, 55 degrees abduction, 100 degrees forward elevation.  Intact EPL, FPL, finger abduction, finger adduction, pronation/supination, bicep, tricep, deltoid of operative extremity.  Axillary nerve intact with deltoid firing.  Incision is healing well without evidence of infection or dehiscence.  Incision was made sure to be covered with Steri-Strips from the proximal to distal aspect of the length of the incision.  2+ radial pulse of the operative extremity  Plan is discontinue sling.  Okay to very lightly lift with the operative extremity but no lifting anything heavier than a coffee cup or cell phone.  Start physical therapy to focus on passive range of motion and active range of motion with deltoid isometrics.  Do not want to externally rotate past 30 degrees to protect subscapularis repair.  Follow-up in 4 weeks for clinical recheck.  We will refer [name] upstairs to physical therapy where she will work with [name] who she had good experience with last time she had physical therapy.  Follow-Up Instructions: No follow-ups on file.   Orders:  Orders Placed This Encounter   Procedures   XR Shoulder Left   No orders of the defined types were placed in this encounter.   Imaging: No results found.  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   Diabetes mellitus without complication ([place])    H/O seasonal allergies    History of hiatal hernia    Hyperlipidemia    Hypertension    Lipoma of buttock    Pneumonia    as a child   Sleep apnea    no cpap    Family History  Family history unknown: Yes    Past Surgical History:  Procedure Laterality Date   ABDOMINAL HYSTERECTOMY     ovaries still intact   BACK SURGERY     lumbar fusion   CARPAL TUNNEL RELEASE Right    CATARACT EXTRACTION, BILATERAL     CHOLECYSTECTOMY     COLONOSCOPY WITH PROPOFOL N/A 07/30/2017   Procedure: COLONOSCOPY WITH PROPOFOL;  Surgeon:  Juanita Craver, MD;  Location: [facility];  Service: Endoscopy;  Laterality: N/A;   ELBOW SURGERY Right    EXPLORATORY LAPAROTOMY     ovaries freed from intestines.   FOOT SURGERY     retained hardware.   KNEE ARTHROSCOPY Right    scope surgery   LIPOMA EXCISION Left 11/19/2021   Procedure: EXCISION OF LEFT UPPER BUTTOCK LIPOMA;  Surgeon: Greer Pickerel, MD;  Location: [facility];  Service: General;  Laterality: Left;   REVERSE SHOULDER ARTHROPLASTY Left 05/06/2022   Procedure: LEFT REVERSE SHOULDER ARTHROPLASTY;  Surgeon: Meredith Pel, MD;  Location: [facility];  Service: Orthopedics;  Laterality: Left;   TONSILLECTOMY     TOTAL  KNEE ARTHROPLASTY Left 02/16/2015   Procedure: LEFT TOTAL KNEE ARTHROPLASTY;  Surgeon: Sydnee Cabal, MD;  Location: [facility];  Service: Orthopedics;  Laterality: Left;   TOTAL SHOULDER ARTHROPLASTY Right 07/26/2019   Procedure: RIGHT TOTAL SHOULDER ARTHROPLASTY;  Surgeon: Meredith Pel, MD;  Location: [facility];  Service: Orthopedics;  Laterality: Right;   WRIST SURGERY Left    x2 "cysts"   Social History   Occupational History   Not on file  Tobacco Use   Smoking status: Former    Packs/day: 0.50    Years: 10.00    Total pack years: 5.00    Types: Cigarettes    Quit date: 02/07/1973    Years since quitting: 49.3   Smokeless tobacco: Never  Vaping Use   Vaping Use: Never used  Substance and Sexual Activity   Alcohol use: Not Currently   Drug use: No   Sexual activity: Not Currently    Birth control/protection: Surgical    Comment: Hyst

## 2022-06-04 ENCOUNTER — Other Ambulatory Visit: Payer: Self-pay

## 2022-06-04 ENCOUNTER — Telehealth: Payer: Self-pay | Admitting: Orthopedic Surgery

## 2022-06-04 DIAGNOSIS — M25512 Pain in left shoulder: Secondary | ICD-10-CM

## 2022-06-04 NOTE — Telephone Encounter (Signed)
Patient called. Would like to know when she can start PT? Her call back number is [phone] she would like PT here at [facility]

## 2022-06-09 ENCOUNTER — Encounter: Payer: Self-pay | Admitting: Physical Therapy

## 2022-06-09 ENCOUNTER — Other Ambulatory Visit: Payer: Self-pay

## 2022-06-09 ENCOUNTER — Ambulatory Visit: Payer: No Typology Code available for payment source | Admitting: Physical Therapy

## 2022-06-09 DIAGNOSIS — M25512 Pain in left shoulder: Secondary | ICD-10-CM | POA: Diagnosis not present

## 2022-06-09 DIAGNOSIS — M6281 Muscle weakness (generalized): Secondary | ICD-10-CM

## 2022-06-09 DIAGNOSIS — R600 Localized edema: Secondary | ICD-10-CM

## 2022-06-09 NOTE — Therapy (Signed)
OUTPATIENT PHYSICAL THERAPY SHOULDER EVALUATION   Patient Name: ANTONIO SUPPA MRN: [id number] DOB:October 25, 1945, 77 y.o., female Today's Date: 06/09/2022  END OF SESSION:  PT End of Session - 06/09/22 1453     Visit Number 1    Number of Visits 15    Date for PT Re-Evaluation 08/04/22    Authorization Type [employer]    PT Start Time 1430    PT Stop Time 1515    PT Time Calculation (min) 45 min    Activity Tolerance Patient tolerated treatment well    Behavior During Therapy WFL for tasks assessed/performed             Past Medical History:  Diagnosis Date   Anxiety    Arthritis    osteoarthritis left knee, right shoulder "locks" occ.   Diabetes mellitus without complication ([place])    H/O seasonal allergies    History of hiatal hernia    Hyperlipidemia    Hypertension    Lipoma of buttock    Pneumonia    as a child   Sleep apnea    no cpap   Past Surgical History:  Procedure Laterality Date   ABDOMINAL HYSTERECTOMY     ovaries still intact   BACK SURGERY     lumbar fusion   CARPAL TUNNEL RELEASE Right    CATARACT EXTRACTION, BILATERAL     CHOLECYSTECTOMY     COLONOSCOPY WITH PROPOFOL N/A 07/30/2017   Procedure: COLONOSCOPY WITH PROPOFOL;  Surgeon: Juanita Craver, MD;  Location: [facility];  Service: Endoscopy;  Laterality: N/A;   ELBOW SURGERY Right    EXPLORATORY LAPAROTOMY     ovaries freed from intestines.   FOOT SURGERY     retained hardware.   KNEE ARTHROSCOPY Right    scope surgery   LIPOMA EXCISION Left 11/19/2021   Procedure: EXCISION OF LEFT UPPER BUTTOCK LIPOMA;  Surgeon: Greer Pickerel, MD;  Location: [facility];  Service: General;  Laterality: Left;   REVERSE SHOULDER ARTHROPLASTY Left 05/06/2022   Procedure: LEFT REVERSE SHOULDER ARTHROPLASTY;  Surgeon: Meredith Pel, MD;  Location: [facility];  Service: Orthopedics;  Laterality: Left;   TONSILLECTOMY     TOTAL KNEE ARTHROPLASTY Left 02/16/2015   Procedure: LEFT TOTAL KNEE  ARTHROPLASTY;  Surgeon: Sydnee Cabal, MD;  Location: [facility];  Service: Orthopedics;  Laterality: Left;   TOTAL SHOULDER ARTHROPLASTY Right 07/26/2019   Procedure: RIGHT TOTAL SHOULDER ARTHROPLASTY;  Surgeon: Meredith Pel, MD;  Location: [facility];  Service: Orthopedics;  Laterality: Right;   WRIST SURGERY Left    x2 "cysts"   Patient Active Problem List   Diagnosis Date Noted   Biceps tendonitis on left 05/11/2022   OA (osteoarthritis) of shoulder 05/06/2022   S/P reverse total shoulder arthroplasty, 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   S/P shoulder replacement, right 07/26/2019   S/P knee replacement 02/16/2015   Osteoarthritis of left knee 02/16/2015   OBESITY 10/25/2007   HIATAL HERNIA 10/25/2007   ABDOMINAL PAIN-EPIGASTRIC 10/25/2007    PCP: Arthur Holms, NP   REFERRING PROVIDER: Donella Stade, PA-C   REFERRING DIAG:   THERAPY DIAG:  Acute pain of left shoulder  Muscle weakness (generalized)  Localized edema  Rationale for Evaluation and Treatment: Rehabilitation  ONSET DATE: Lt rTSA 05/06/22  SUBJECTIVE:  SUBJECTIVE STATEMENT: She had shoulder surgery so has some pain and weakness from this. She had PT in the past for her Rt shoulder TSA and says it is doing well. She has been using her CPM machine for Lt shoulder ROM at home. She is Right handed.   PERTINENT HISTORY: PMH: Lt rTSA 05/06/22, Rt TSA 2021, Lt TKA 2016  PAIN:  Are you having pain? Yes: NPRS scale: 5/10 Pain location: anterior left shoulder Pain description: sharp or dull depends Aggravating factors: moving her arm too much fwd or out Relieving factors: rest, ice or heat  PRECAUTIONS: Shoulder, limit ER to 30 deg to protect subscap repair  WEIGHT BEARING RESTRICTIONS: No  FALLS:  Has patient fallen in last 6 months?  No  OCCUPATION: none   PLOF: Independent  PATIENT GOALS: reduce pain and get her left shoulder better  NEXT MD VISIT:   OBJECTIVE:   DIAGNOSTIC FINDINGS:    PATIENT SURVEYS:  Eval: FOTO 45% functional, goal 59%  COGNITION: Overall cognitive status: Within functional limits for tasks assessed     SENSATION:   POSTURE:   UPPER EXTREMITY ROM:   Active ROM/PROM Right eval Left eval  Shoulder flexion  110/130  Shoulder extension    Shoulder abduction  110/130  Shoulder adduction    Shoulder internal rotation functional behind back   Left iliac crest/40  Shoulder external rotation functional behind head  Occiput /30 (stopped at 30 due to post op precautions)  Elbow flexion    Elbow extension    Wrist flexion    Wrist extension    Wrist ulnar deviation    Wrist radial deviation    Wrist pronation    Wrist supination    (Blank rows = not tested)  UPPER EXTREMITY MMT:  MMT Right eval Left Eval (Did not apply resistance due to post op status)  Shoulder flexion  3  Shoulder extension    Shoulder abduction  3  Shoulder adduction    Shoulder internal rotation  3  Shoulder external rotation  3  Middle trapezius    Lower trapezius    Elbow flexion    Elbow extension    Wrist flexion    Wrist extension    Wrist ulnar deviation    Wrist radial deviation    Wrist pronation    Wrist supination    Grip strength (lbs)    (Blank rows = not tested)  SHOULDER SPECIAL TESTS:   JOINT MOBILITY TESTING:    PALPATION:  Tender to palpation anterior left shoulder   TODAY'S TREATMENT:  Eval HEP creation and review with demonstration and trial set preformed, see below for details -Vasopnuematic device X 10 min, low compression, 34 deg to Lt Shoulder -Lt shoulder PROM to tolerance flexion, Abd, IR, did not perform ER as she already has 30 deg of motion and has precaution to keep her at 30 deg for 2 more weeks.     PATIENT EDUCATION: Education details: HEP, PT  plan of care Person educated: Patient Education method: Explanation, Demonstration, Verbal cues, and Handouts Education comprehension: verbalized understanding and needs further education   HOME EXERCISE PROGRAM: Access Code: [email or web address] URL: [email or web address] Date: 06/09/2022 Prepared by: Elsie Ra  Exercises - Isometric Shoulder Flexion at Wall  - 2 x daily - 6 x weekly - 1 sets - 10 reps - 5 hold - Standing Isometric Shoulder Internal Rotation at Doorway  - 2 x daily - 6 x weekly - 1 sets - 10 reps - 5  hold - Standing Isometric Shoulder External Rotation with Doorway  - 2 x daily - 6 x weekly - 1 sets - 10 reps - 5 hold - Isometric Shoulder Abduction at Wall  - 2 x daily - 6 x weekly - 1 sets - 10 reps - Standing Shoulder Row with Anchored Resistance  - 2 x daily - 6 x weekly - 1 sets - 10 reps - Shoulder extension with resistance - Neutral  - 2 x daily - 6 x weekly - 1 sets - 10 reps - Supine Shoulder Flexion with Dowel  - 1 x daily - 3 x weekly - 1 sets - 10 reps - Supine Shoulder Abduction AAROM with Dowel  - 2 x daily - 6 x weekly - 1 sets - 10 reps  ASSESSMENT:  CLINICAL IMPRESSION: Patient referred to PT s/p left reverse shoulder arthroplasty with subscap repair on 05/06/2022.She is doing quite well up to this point but will benefit from skilled PT to address below impairments, limitations and improve overall function.  OBJECTIVE IMPAIRMENTS: decreased activity tolerance, decreased shoulder mobility, decreased ROM, decreased strength, impaired flexibility, impaired UE use, postural dysfunction, and pain.  ACTIVITY LIMITATIONS: reaching, lifting, carry,  cleaning, driving, sleeping  PERSONAL FACTORS: PMH: Lt rTSA 05/06/22, Rt TSA 2021, Lt TKA 2016 also affecting patient's functional outcome.  REHAB POTENTIAL: Good  CLINICAL DECISION MAKING: Stable/uncomplicated  EVALUATION COMPLEXITY: Low    GOALS: Short term PT Goals Target date: 07/07/2022    Pt  will be I and compliant with HEP. Baseline:  Goal status: New Pt will decrease pain by 25% overall Baseline: Goal status: New  Long term PT goals Target date:08/04/2022   Pt will improve Rt shoulder AROM to [facility] to improve functional reaching Baseline: Goal status: New Pt will improve  Rt shoulder strength to at least 4+/5 MMT to improve functional strength Baseline: Goal status: New Pt will improve FOTO to at least 59% functional to show improved function Baseline: Goal status: New Pt will reduce pain to overall less than 3/10 with usual activity and work activity. Baseline: Goal status: New  PLAN: PT FREQUENCY: 1-2 times per week   PT DURATION: 12 weeks  PLANNED INTERVENTIONS (unless contraindicated): aquatic PT, Canalith repositioning, cryotherapy, Electrical stimulation, Iontophoresis with 4 mg/ml dexamethasome, Moist heat, traction, Ultrasound, gait training, Therapeutic exercise, balance training, neuromuscular re-education, patient/family education, prosthetic training, manual techniques, passive ROM, dry needling, taping, vasopnuematic device, vestibular, spinal manipulations, joint manipulations  PLAN FOR NEXT SESSION: review HEP, no ER past 30 deg and no IR strengthening to protect subscap repair until 06/17/22    Debbe Odea, PT,DPT 06/09/2022, 3:11 PM

## 2022-06-11 ENCOUNTER — Ambulatory Visit: Payer: No Typology Code available for payment source | Admitting: Physical Therapy

## 2022-06-16 ENCOUNTER — Ambulatory Visit: Payer: No Typology Code available for payment source | Admitting: Orthopedic Surgery

## 2022-06-16 DIAGNOSIS — M25512 Pain in left shoulder: Secondary | ICD-10-CM

## 2022-06-16 DIAGNOSIS — G8929 Other chronic pain: Secondary | ICD-10-CM

## 2022-06-16 MED ORDER — HYDROCODONE-ACETAMINOPHEN 5-325 MG PO TABS: 1.0000 | ORAL_TABLET | Freq: Three times a day (TID) | ORAL | 0 refills | Status: AC | PRN

## 2022-06-17 ENCOUNTER — Encounter: Payer: Self-pay | Admitting: Orthopedic Surgery

## 2022-06-17 NOTE — Progress Notes (Signed)
Post-Op Visit Note   Patient: [name]           Date of Birth: 07/11/1945           MRN: [id number] Visit Date: 06/16/2022 PCP: Arthur Holms, NP   Assessment & Plan:  Chief Complaint:  Chief Complaint  Patient presents with   Left Shoulder - Follow-up    05/06/22 (5w 6d)Left Reverse Shoulder Arthroplasty      Visit Diagnoses:  1. Chronic left shoulder pain     Plan: [name] is a 77 year old patient who is status post left reverse shoulder replacement 05/06/2022.  She is making some progress.  Her mother lives with her and she cares for her mother who has early dementia.  Physical therapy says she is ahead of schedule.  On examination she is able to get her left hand behind her head.  She has forward flexion abduction above 90 degrees.  Incision is intact.  Plan is to refill Norco and come back in 6 weeks for clinical recheck and final check.  Follow-Up Instructions: No follow-ups on file.   Orders:  No orders of the defined types were placed in this encounter.  Meds ordered this encounter  Medications   HYDROcodone-acetaminophen (NORCO/VICODIN) 5-325 MG tablet    Sig: Take 1 tablet by mouth every 8 (eight) hours as needed for moderate pain.    Dispense:  30 tablet    Refill:  0    Imaging: No results found.  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   Diabetes mellitus without complication ([place])    H/O seasonal allergies    History of hiatal hernia    Hyperlipidemia     Hypertension    Lipoma of buttock    Pneumonia    as a child   Sleep apnea    no cpap    Family History  Family history unknown: Yes    Past Surgical History:  Procedure Laterality Date   ABDOMINAL HYSTERECTOMY     ovaries still intact   BACK SURGERY     lumbar fusion   CARPAL TUNNEL RELEASE Right    CATARACT EXTRACTION, BILATERAL     CHOLECYSTECTOMY     COLONOSCOPY WITH PROPOFOL N/A 07/30/2017   Procedure: COLONOSCOPY WITH PROPOFOL;  Surgeon: Juanita Craver, MD;  Location: [facility];  Service: Endoscopy;  Laterality: N/A;   ELBOW SURGERY Right    EXPLORATORY LAPAROTOMY     ovaries freed from intestines.   FOOT SURGERY     retained hardware.   KNEE ARTHROSCOPY Right    scope surgery   LIPOMA EXCISION Left 11/19/2021   Procedure: EXCISION OF LEFT UPPER BUTTOCK LIPOMA;  Surgeon: Greer Pickerel, MD;  Location: [facility];  Service: General;  Laterality: Left;   REVERSE SHOULDER ARTHROPLASTY Left 05/06/2022   Procedure: LEFT REVERSE SHOULDER ARTHROPLASTY;  Surgeon: Meredith Pel, MD;  Location: [facility];  Service: Orthopedics;  Laterality: Left;   TONSILLECTOMY     TOTAL KNEE  ARTHROPLASTY Left 02/16/2015   Procedure: LEFT TOTAL KNEE ARTHROPLASTY;  Surgeon: Sydnee Cabal, MD;  Location: [facility];  Service: Orthopedics;  Laterality: Left;   TOTAL SHOULDER ARTHROPLASTY Right 07/26/2019   Procedure: RIGHT TOTAL SHOULDER ARTHROPLASTY;  Surgeon: Meredith Pel, MD;  Location: [facility];  Service: Orthopedics;  Laterality: Right;   WRIST SURGERY Left    x2 "cysts"   Social History   Occupational History   Not on file  Tobacco Use   Smoking status: Former    Packs/day: 0.50    Years: 10.00    Total pack years: 5.00    Types: Cigarettes    Quit date: 02/07/1973    Years since quitting: 49.3   Smokeless tobacco: Never  Vaping Use   Vaping Use: Never used  Substance and Sexual Activity   Alcohol use: Not Currently   Drug use: No   Sexual activity: Not  Currently    Birth control/protection: Surgical    Comment: Hyst

## 2022-06-18 ENCOUNTER — Ambulatory Visit: Payer: No Typology Code available for payment source | Admitting: Physical Therapy

## 2022-06-18 ENCOUNTER — Encounter: Payer: Self-pay | Admitting: Physical Therapy

## 2022-06-18 DIAGNOSIS — M25512 Pain in left shoulder: Secondary | ICD-10-CM

## 2022-06-18 DIAGNOSIS — M6281 Muscle weakness (generalized): Secondary | ICD-10-CM | POA: Diagnosis not present

## 2022-06-18 DIAGNOSIS — R600 Localized edema: Secondary | ICD-10-CM | POA: Diagnosis not present

## 2022-06-18 NOTE — Therapy (Signed)
OUTPATIENT PHYSICAL THERAPY SHOULDER TREATMENT   Patient Name: [name] MRN: [id number] DOB:11-26-1945, 77 y.o., female Today's Date: 06/18/2022  END OF SESSION:  PT End of Session - 06/18/22 1341     Visit Number 2    Number of Visits 15    Date for PT Re-Evaluation 08/04/22    Authorization Type devoted health    PT Start Time 1339    PT Stop Time 1422    PT Time Calculation (min) 43 min    Activity Tolerance Patient tolerated treatment well    Behavior During Therapy WFL for tasks assessed/performed              Past Medical History:  Diagnosis Date   Anxiety    Arthritis    osteoarthritis left knee, right shoulder "locks" occ.   Diabetes mellitus without complication ([place])    H/O seasonal allergies    History of hiatal hernia    Hyperlipidemia    Hypertension    Lipoma of buttock    Pneumonia    as a child   Sleep apnea    no cpap   Past Surgical History:  Procedure Laterality Date   ABDOMINAL HYSTERECTOMY     ovaries still intact   BACK SURGERY     lumbar fusion   CARPAL TUNNEL RELEASE Right    CATARACT EXTRACTION, BILATERAL     CHOLECYSTECTOMY     COLONOSCOPY WITH PROPOFOL N/A 07/30/2017   Procedure: COLONOSCOPY WITH PROPOFOL;  Surgeon: Juanita Craver, MD;  Location: [facility];  Service: Endoscopy;  Laterality: N/A;   ELBOW SURGERY Right    EXPLORATORY LAPAROTOMY     ovaries freed from intestines.   FOOT SURGERY     retained hardware.   KNEE ARTHROSCOPY Right    scope surgery   LIPOMA EXCISION Left 11/19/2021   Procedure: EXCISION OF LEFT UPPER BUTTOCK LIPOMA;  Surgeon: Greer Pickerel, MD;  Location: [facility];  Service: General;  Laterality: Left;   REVERSE SHOULDER ARTHROPLASTY Left 05/06/2022   Procedure: LEFT REVERSE SHOULDER ARTHROPLASTY;  Surgeon: Meredith Pel, MD;  Location: [facility];  Service: Orthopedics;  Laterality: Left;   TONSILLECTOMY     TOTAL KNEE ARTHROPLASTY Left 02/16/2015   Procedure: LEFT TOTAL KNEE  ARTHROPLASTY;  Surgeon: Sydnee Cabal, MD;  Location: [facility];  Service: Orthopedics;  Laterality: Left;   TOTAL SHOULDER ARTHROPLASTY Right 07/26/2019   Procedure: RIGHT TOTAL SHOULDER ARTHROPLASTY;  Surgeon: Meredith Pel, MD;  Location: [facility];  Service: Orthopedics;  Laterality: Right;   WRIST SURGERY Left    x2 "cysts"   Patient Active Problem List   Diagnosis Date Noted   Biceps tendonitis on left 05/11/2022   OA (osteoarthritis) of shoulder 05/06/2022   S/P reverse total shoulder arthroplasty, 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   S/P shoulder replacement, right 07/26/2019   S/P knee replacement 02/16/2015   Osteoarthritis of left knee 02/16/2015   OBESITY 10/25/2007   HIATAL HERNIA 10/25/2007   ABDOMINAL PAIN-EPIGASTRIC 10/25/2007    PCP: Arthur Holms, NP   REFERRING PROVIDER: Donella Stade, PA-C   REFERRING DIAG:   THERAPY DIAG:  Acute pain of left shoulder  Muscle weakness (generalized)  Localized edema  Rationale for Evaluation and Treatment: Rehabilitation  ONSET DATE: Lt rTSA 05/06/22  SUBJECTIVE:  SUBJECTIVE STATEMENT:  I saw Dr. Marlou Sa last week, he's happy with how I'm doing. My mom has been keeping me up all night (has dementia). I feel like my BP has been a little unpredictable, I've had a HA for 4 days, laid down with my head down/feet up and it came down. Shoulder is sore, exercises are going OK.   PERTINENT HISTORY: PMH: Lt rTSA 05/06/22, Rt TSA 2021, Lt TKA 2016  PAIN:  Are you having pain? Yes: NPRS scale: 4-5/10 Pain location: anterior left shoulder Pain description: dull pain Aggravating factors: pain comes and goes Relieving factors: not moving it, rest   PRECAUTIONS: Shoulder, limit ER to 30 deg to protect subscap repair  WEIGHT BEARING RESTRICTIONS: No  FALLS:  Has  patient fallen in last 6 months? No  OCCUPATION: none   PLOF: Independent  PATIENT GOALS: reduce pain and get her left shoulder better  NEXT MD VISIT:   OBJECTIVE:   DIAGNOSTIC FINDINGS:    PATIENT SURVEYS:  Eval: FOTO 45% functional, goal 59%  COGNITION: Overall cognitive status: Within functional limits for tasks assessed     SENSATION:   POSTURE:   UPPER EXTREMITY ROM:   Active ROM/PROM Right eval Left eval  Shoulder flexion  110/130  Shoulder extension    Shoulder abduction  110/130  Shoulder adduction    Shoulder internal rotation functional behind back   Left iliac crest/40  Shoulder external rotation functional behind head  Occiput /30 (stopped at 30 due to post op precautions)  Elbow flexion    Elbow extension    Wrist flexion    Wrist extension    Wrist ulnar deviation    Wrist radial deviation    Wrist pronation    Wrist supination    (Blank rows = not tested)  UPPER EXTREMITY MMT:  MMT Right eval Left Eval (Did not apply resistance due to post op status)  Shoulder flexion  3  Shoulder extension    Shoulder abduction  3  Shoulder adduction    Shoulder internal rotation  3  Shoulder external rotation  3  Middle trapezius    Lower trapezius    Elbow flexion    Elbow extension    Wrist flexion    Wrist extension    Wrist ulnar deviation    Wrist radial deviation    Wrist pronation    Wrist supination    Grip strength (lbs)    (Blank rows = not tested)  SHOULDER SPECIAL TESTS:   JOINT MOBILITY TESTING:    PALPATION:  Tender to palpation anterior left shoulder   TODAY'S TREATMENT:   06/18/22  BP  164/99 at beginning of session, 170/107 at EOS, 156/97  Manual  PROM with protocol- flexion, ABD, ER to 30 and cautiously over as tolerated (OK per restrictions from eval at this point)  TherEx  Supine flexion AAROM x10 with cane  Supine ABD AAROM x8 with cane limited by pain Serratus punches 2x10 2# Supine serratus  alphabet 1# x2  Randomized external perturbations 60 seconds x2 supine    Eval  HEP creation and review with demonstration and trial set preformed, see below for details -Vasopnuematic device X 10 min, low compression, 34 deg to Lt Shoulder -Lt shoulder PROM to tolerance flexion, Abd, IR, did not perform ER as she already has 30 deg of motion and has precaution to keep her at 30 deg for 2 more weeks.     PATIENT EDUCATION: Education details: HEP, PT plan of care  Person educated: Patient Education method: Explanation, Demonstration, Verbal cues, and Handouts Education comprehension: verbalized understanding and needs further education   HOME EXERCISE PROGRAM: Access Code: [email or web address] URL: [email or web address] Date: 06/09/2022 Prepared by: Elsie Ra  Exercises - Isometric Shoulder Flexion at Wall  - 2 x daily - 6 x weekly - 1 sets - 10 reps - 5 hold - Standing Isometric Shoulder Internal Rotation at Doorway  - 2 x daily - 6 x weekly - 1 sets - 10 reps - 5 hold - Standing Isometric Shoulder External Rotation with Doorway  - 2 x daily - 6 x weekly - 1 sets - 10 reps - 5 hold - Isometric Shoulder Abduction at Wall  - 2 x daily - 6 x weekly - 1 sets - 10 reps - Standing Shoulder Row with Anchored Resistance  - 2 x daily - 6 x weekly - 1 sets - 10 reps - Shoulder extension with resistance - Neutral  - 2 x daily - 6 x weekly - 1 sets - 10 reps - Supine Shoulder Flexion with Dowel  - 1 x daily - 3 x weekly - 1 sets - 10 reps - Supine Shoulder Abduction AAROM with Dowel  - 2 x daily - 6 x weekly - 1 sets - 10 reps  ASSESSMENT:  CLINICAL IMPRESSION:   Lundon arrives doing well today, but tells me she had severe HA over the weekend along with significantly elevated BPs. Pressures also elevated in PT today, education provided on importance of addressing symptomatic HA in combination with high BP, wrote down all BPs from today's session for her and recommended she continue to  track pressures at home, also call her MD. Otherwise continued working on post-op shoulder as appropriate per protocol. Passed the 6 week mark/ March 5th date per precautions so we did start working on ER ROM a bit more and as tolerated today with no increased pain noted. Otherwise introduced shoulder stability and periscap strengthening work. Will continue efforts.   OBJECTIVE IMPAIRMENTS: decreased activity tolerance, decreased shoulder mobility, decreased ROM, decreased strength, impaired flexibility, impaired UE use, postural dysfunction, and pain.  ACTIVITY LIMITATIONS: reaching, lifting, carry,  cleaning, driving, sleeping  PERSONAL FACTORS: PMH: Lt rTSA 05/06/22, Rt TSA 2021, Lt TKA 2016 also affecting patient's functional outcome.  REHAB POTENTIAL: Good  CLINICAL DECISION MAKING: Stable/uncomplicated  EVALUATION COMPLEXITY: Low    GOALS: Short term PT Goals Target date: 07/07/2022    Pt will be I and compliant with HEP. Baseline:  Goal status: New Pt will decrease pain by 25% overall Baseline: Goal status: New  Long term PT goals Target date:08/04/2022   Pt will improve Rt shoulder AROM to [facility] to improve functional reaching Baseline: Goal status: New Pt will improve  Rt shoulder strength to at least 4+/5 MMT to improve functional strength Baseline: Goal status: New Pt will improve FOTO to at least 59% functional to show improved function Baseline: Goal status: New Pt will reduce pain to overall less than 3/10 with usual activity and work activity. Baseline: Goal status: New  PLAN: PT FREQUENCY: 1-2 times per week   PT DURATION: 12 weeks  PLANNED INTERVENTIONS (unless contraindicated): aquatic PT, Canalith repositioning, cryotherapy, Electrical stimulation, Iontophoresis with 4 mg/ml dexamethasome, Moist heat, traction, Ultrasound, gait training, Therapeutic exercise, balance training, neuromuscular re-education, patient/family education, prosthetic training,  manual techniques, passive ROM, dry needling, taping, vasopnuematic device, vestibular, spinal manipulations, joint manipulations  PLAN FOR NEXT SESSION: review HEP, no ER past 30 deg and no  IR strengthening to protect subscap repair until 06/17/22. Monitor BPs, have been elevated recently.    Deniece Ree PT DPT PN2

## 2022-06-25 ENCOUNTER — Ambulatory Visit: Payer: No Typology Code available for payment source | Admitting: Physical Therapy

## 2022-06-25 ENCOUNTER — Encounter: Payer: Self-pay | Admitting: Physical Therapy

## 2022-06-25 DIAGNOSIS — M25512 Pain in left shoulder: Secondary | ICD-10-CM

## 2022-06-25 DIAGNOSIS — M6281 Muscle weakness (generalized): Secondary | ICD-10-CM

## 2022-06-25 NOTE — Therapy (Signed)
[name] arrived today doing OK, shoulder was feeling great but her back has been causing severe pain especially when being up on her feet.   Due to recent hx of fluctuating blood pressures, checked with measures as follows:  BP 159/103 HR 65 seated at rest   Second measure 135/107 HR 73 still seated at rest  She tells me that she has had "bottom blood pressure numbers" in the 100s at home on her automatic cuff as well over the past couple of days.   Due to elevated diastolic pressures opted to hold off on physical exercise and had her call her PCP instead so that this problem can be better managed and we can safely progress her in PT.   No charge for today's visit.  Deniece Ree PT DPT PN2

## 2022-07-02 ENCOUNTER — Encounter: Payer: Self-pay | Admitting: Physical Therapy

## 2022-07-02 ENCOUNTER — Ambulatory Visit: Payer: No Typology Code available for payment source | Admitting: Physical Therapy

## 2022-07-02 DIAGNOSIS — M25512 Pain in left shoulder: Secondary | ICD-10-CM | POA: Diagnosis not present

## 2022-07-02 DIAGNOSIS — M6281 Muscle weakness (generalized): Secondary | ICD-10-CM | POA: Diagnosis not present

## 2022-07-02 NOTE — Therapy (Signed)
OUTPATIENT PHYSICAL THERAPY SHOULDER TREATMENT   Patient Name: [name] MRN: [id number] DOB:Dec 18, 1945, 77 y.o., female Today's Date: 07/02/2022  END OF SESSION:  PT End of Session - 07/02/22 1307     Visit Number 3    Number of Visits 15    Date for PT Re-Evaluation 08/04/22    Authorization Type devoted health    PT Start Time 1301    PT Stop Time 1345    PT Time Calculation (min) 44 min    Activity Tolerance Patient tolerated treatment well    Behavior During Therapy WFL for tasks assessed/performed              Past Medical History:  Diagnosis Date   Anxiety    Arthritis    osteoarthritis left knee, right shoulder "locks" occ.   Diabetes mellitus without complication ([place])    H/O seasonal allergies    History of hiatal hernia    Hyperlipidemia    Hypertension    Lipoma of buttock    Pneumonia    as a child   Sleep apnea    no cpap   Past Surgical History:  Procedure Laterality Date   ABDOMINAL HYSTERECTOMY     ovaries still intact   BACK SURGERY     lumbar fusion   CARPAL TUNNEL RELEASE Right    CATARACT EXTRACTION, BILATERAL     CHOLECYSTECTOMY     COLONOSCOPY WITH PROPOFOL N/A 07/30/2017   Procedure: COLONOSCOPY WITH PROPOFOL;  Surgeon: Juanita Craver, MD;  Location: [facility];  Service: Endoscopy;  Laterality: N/A;   ELBOW SURGERY Right    EXPLORATORY LAPAROTOMY     ovaries freed from intestines.   FOOT SURGERY     retained hardware.   KNEE ARTHROSCOPY Right    scope surgery   LIPOMA EXCISION Left 11/19/2021   Procedure: EXCISION OF LEFT UPPER BUTTOCK LIPOMA;  Surgeon: Greer Pickerel, MD;  Location: [place];  Service: General;  Laterality: Left;   REVERSE SHOULDER ARTHROPLASTY Left 05/06/2022   Procedure: LEFT REVERSE SHOULDER ARTHROPLASTY;  Surgeon: Meredith Pel, MD;  Location: [facility];  Service: Orthopedics;  Laterality: Left;   TONSILLECTOMY     TOTAL KNEE ARTHROPLASTY Left 02/16/2015   Procedure: LEFT TOTAL KNEE  ARTHROPLASTY;  Surgeon: Sydnee Cabal, MD;  Location: [facility];  Service: Orthopedics;  Laterality: Left;   TOTAL SHOULDER ARTHROPLASTY Right 07/26/2019   Procedure: RIGHT TOTAL SHOULDER ARTHROPLASTY;  Surgeon: Meredith Pel, MD;  Location: [place];  Service: Orthopedics;  Laterality: Right;   WRIST SURGERY Left    x2 "cysts"   Patient Active Problem List   Diagnosis Date Noted   Biceps tendonitis on left 05/11/2022   OA (osteoarthritis) of shoulder 05/06/2022   S/P reverse total shoulder arthroplasty, 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   S/P shoulder replacement, right 07/26/2019   S/P knee replacement 02/16/2015   Osteoarthritis of left knee 02/16/2015   OBESITY 10/25/2007   HIATAL HERNIA 10/25/2007   ABDOMINAL PAIN-EPIGASTRIC 10/25/2007    PCP: Arthur Holms, NP   REFERRING PROVIDER: Donella Stade, PA-C   REFERRING DIAG:   THERAPY DIAG:  Acute pain of left shoulder  Muscle weakness (generalized)  Rationale for Evaluation and Treatment: Rehabilitation  ONSET DATE: Lt rTSA 05/06/22  SUBJECTIVE:  SUBJECTIVE STATEMENT: She says she had her heart checked and no bad news to report. She says her BP has been better managed and PT does not need to check it, she would like heat to start with today on her shoulder.  PERTINENT HISTORY: PMH: Lt rTSA 05/06/22, Rt TSA 2021, Lt TKA 2016  PAIN:  Are you having pain? Yes: NPRS scale: 5/10 Pain location: anterior left shoulder Pain description: dull pain Aggravating factors: pain comes and goes Relieving factors: not moving it, rest   PRECAUTIONS: Shoulder, limit ER to 30 deg to protect subscap repair until 06/18/22  WEIGHT BEARING RESTRICTIONS: No  FALLS:  Has patient fallen in last 6 months? No  OCCUPATION: none   PLOF: Independent  PATIENT GOALS: reduce pain  and get her left shoulder better  NEXT MD VISIT:   OBJECTIVE:   DIAGNOSTIC FINDINGS:    PATIENT SURVEYS:  Eval: FOTO 45% functional, goal 59%  COGNITION: Overall cognitive status: Within functional limits for tasks assessed     SENSATION:   POSTURE:   UPPER EXTREMITY ROM:   Active ROM/PROM Right eval Left eval Left 07/02/22  Shoulder flexion  110/130 130/  Shoulder extension     Shoulder abduction  110/130 120  Shoulder adduction     Shoulder internal rotation functional behind back   Left iliac crest/40 Left SIJ/  Shoulder external rotation functional behind head  Occiput /30 (stopped at 30 due to post op precautions) C6/  Elbow flexion     Elbow extension     Wrist flexion     Wrist extension     Wrist ulnar deviation     Wrist radial deviation     Wrist pronation     Wrist supination     (Blank rows = not tested)  UPPER EXTREMITY MMT:  MMT Right eval Left Eval (Did not apply resistance due to post op status) Left 07/02/22  Shoulder flexion  3 4  Shoulder extension     Shoulder abduction  3 4  Shoulder adduction     Shoulder internal rotation  3 4  Shoulder external rotation  3 4  Middle trapezius     Lower trapezius     Elbow flexion     Elbow extension     Wrist flexion     Wrist extension     Wrist ulnar deviation     Wrist radial deviation     Wrist pronation     Wrist supination     Grip strength (lbs)     (Blank rows = not tested)  SHOULDER SPECIAL TESTS:   JOINT MOBILITY TESTING:    PALPATION:  Tender to palpation anterior left shoulder   TODAY'S TREATMENT:  07/02/22 Moist heat to Rt shoulder at beginning X 7 min (not included in billing time) Updated measurements  Updated HEP Pulleys 2 min flexion, 2 min abd Bilat rows red 2X10 Bilat shoulder extensions red 2X10 Lt shoulder IR red 2X10 Lt shoulder ER red 2X10 Shoulder AAROM flexion, abd, ER with 1# bar X 10 each  06/18/22  BP  164/99 at beginning of session, 170/107 at  EOS, 156/97  Manual  PROM with protocol- flexion, ABD, ER to 30 and cautiously over as tolerated (OK per restrictions from eval at this point)  TherEx  Supine flexion AAROM x10 with cane  Supine ABD AAROM x8 with cane limited by pain Serratus punches 2x10 2# Supine serratus alphabet 1# x2  Randomized external perturbations 60 seconds x2 supine  Eval  HEP creation and review with demonstration and trial set preformed, see below for details -Vasopnuematic device X 10 min, low compression, 34 deg to Lt Shoulder -Lt shoulder PROM to tolerance flexion, Abd, IR, did not perform ER as she already has 30 deg of motion and has precaution to keep her at 30 deg for 2 more weeks.     PATIENT EDUCATION: Education details: HEP, PT plan of care Person educated: Patient Education method: Explanation, Demonstration, Verbal cues, and Handouts Education comprehension: verbalized understanding and needs further education   HOME EXERCISE PROGRAM: Access Code: [email or web address] URL: [email or web address] Date:updated  07/02/2022 Prepared by: Elsie Ra  Exercises - Standing Shoulder Row with Anchored Resistance  - 2 x daily - 6 x weekly - 2 sets - 10 reps - Shoulder extension with resistance - Neutral  - 2 x daily - 6 x weekly - 2 sets - 10 reps - Shoulder External Rotation with Anchored Resistance  - 2 x daily - 6 x weekly - 2 sets - 10 reps - Shoulder Internal Rotation with Resistance  - 2 x daily - 6 x weekly - 2 sets - 10 reps - Seated Shoulder Flexion AAROM with Dowel  - 2 x daily - 6 x weekly - 1-2 sets - 10 reps - Seated Shoulder Abduction AAROM with Dowel  - 2 x daily - 6 x weekly - 1 sets - 10 reps - Seated Shoulder External Rotation AAROM with Dowel  - 2 x daily - 6 x weekly - 1 sets - 10 reps ASSESSMENT:  CLINICAL IMPRESSION: She showed improvements in left shoulder ROM and strength with updated measurements. I progressed her HEP today to reflect her progress. She will  continue to benefit from Skilled PT.   OBJECTIVE IMPAIRMENTS: decreased activity tolerance, decreased shoulder mobility, decreased ROM, decreased strength, impaired flexibility, impaired UE use, postural dysfunction, and pain.  ACTIVITY LIMITATIONS: reaching, lifting, carry,  cleaning, driving, sleeping  PERSONAL FACTORS: PMH: Lt rTSA 05/06/22, Rt TSA 2021, Lt TKA 2016 also affecting patient's functional outcome.  REHAB POTENTIAL: Good  CLINICAL DECISION MAKING: Stable/uncomplicated  EVALUATION COMPLEXITY: Low    GOALS: Short term PT Goals Target date: 07/07/2022    Pt will be I and compliant with HEP. Baseline:  Goal status: MET 07/02/22 Pt will decrease pain by 25% overall Baseline: Goal status: ongoing 06/3022  Long term PT goals Target date:08/04/2022   Pt will improve Rt shoulder AROM to [facility] to improve functional reaching Baseline: Goal status: ongoing 06/3022 Pt will improve  Rt shoulder strength to at least 4+/5 MMT to improve functional strength Baseline: Goal status: ongoing 06/3022 Pt will improve FOTO to at least 59% functional to show improved function Baseline: Goal status: ongoing 06/3022 Pt will reduce pain to overall less than 3/10 with usual activity and work activity. Baseline: Goal status: ongoing 06/3022  PLAN: PT FREQUENCY: 1-2 times per week   PT DURATION: 12 weeks  PLANNED INTERVENTIONS (unless contraindicated): aquatic PT, Canalith repositioning, cryotherapy, Electrical stimulation, Iontophoresis with 4 mg/ml dexamethasome, Moist heat, traction, Ultrasound, gait training, Therapeutic exercise, balance training, neuromuscular re-education, patient/family education, prosthetic training, manual techniques, passive ROM, dry needling, taping, vasopnuematic device, vestibular, spinal manipulations, joint manipulations  PLAN FOR NEXT SESSION: progress as tolerated   Elsie Ra, PT, DPT 07/02/22 1:44 PM

## 2022-07-09 ENCOUNTER — Ambulatory Visit (INDEPENDENT_AMBULATORY_CARE_PROVIDER_SITE_OTHER): Payer: No Typology Code available for payment source | Admitting: Physical Therapy

## 2022-07-09 ENCOUNTER — Encounter: Payer: Self-pay | Admitting: Physical Therapy

## 2022-07-09 DIAGNOSIS — R600 Localized edema: Secondary | ICD-10-CM | POA: Diagnosis not present

## 2022-07-09 DIAGNOSIS — M6281 Muscle weakness (generalized): Secondary | ICD-10-CM

## 2022-07-09 DIAGNOSIS — M25512 Pain in left shoulder: Secondary | ICD-10-CM | POA: Diagnosis not present

## 2022-07-09 NOTE — Therapy (Signed)
OUTPATIENT PHYSICAL THERAPY SHOULDER TREATMENT   Patient Name: [name] MRN: [id number] DOB:10-21-1945, 77 y.o., female Today's Date: 07/09/2022  END OF SESSION:  PT End of Session - 07/09/22 1519     Visit Number 4    Number of Visits 15    Date for PT Re-Evaluation 08/04/22    Authorization Type [employer]    PT Start Time 1430    PT Stop Time 1508    PT Time Calculation (min) 38 min    Activity Tolerance Patient tolerated treatment well    Behavior During Therapy WFL for tasks assessed/performed               Past Medical History:  Diagnosis Date   Anxiety    Arthritis    osteoarthritis left knee, right shoulder "locks" occ.   Diabetes mellitus without complication ([place])    H/O seasonal allergies    History of hiatal hernia    Hyperlipidemia    Hypertension    Lipoma of buttock    Pneumonia    as a child   Sleep apnea    no cpap   Past Surgical History:  Procedure Laterality Date   ABDOMINAL HYSTERECTOMY     ovaries still intact   BACK SURGERY     lumbar fusion   CARPAL TUNNEL RELEASE Right    CATARACT EXTRACTION, BILATERAL     CHOLECYSTECTOMY     COLONOSCOPY WITH PROPOFOL N/A 07/30/2017   Procedure: COLONOSCOPY WITH PROPOFOL;  Surgeon: Juanita Craver, MD;  Location: [facility];  Service: Endoscopy;  Laterality: N/A;   ELBOW SURGERY Right    EXPLORATORY LAPAROTOMY     ovaries freed from intestines.   FOOT SURGERY     retained hardware.   KNEE ARTHROSCOPY Right    scope surgery   LIPOMA EXCISION Left 11/19/2021   Procedure: EXCISION OF LEFT UPPER BUTTOCK LIPOMA;  Surgeon: Greer Pickerel, MD;  Location: [facility];  Service: General;  Laterality: Left;   REVERSE SHOULDER ARTHROPLASTY Left 05/06/2022   Procedure: LEFT REVERSE SHOULDER ARTHROPLASTY;  Surgeon: Meredith Pel, MD;  Location: [facility];  Service: Orthopedics;  Laterality: Left;   TONSILLECTOMY     TOTAL KNEE ARTHROPLASTY Left 02/16/2015   Procedure: LEFT TOTAL  KNEE ARTHROPLASTY;  Surgeon: Sydnee Cabal, MD;  Location: [facility];  Service: Orthopedics;  Laterality: Left;   TOTAL SHOULDER ARTHROPLASTY Right 07/26/2019   Procedure: RIGHT TOTAL SHOULDER ARTHROPLASTY;  Surgeon: Meredith Pel, MD;  Location: [facility];  Service: Orthopedics;  Laterality: Right;   WRIST SURGERY Left    x2 "cysts"   Patient Active Problem List   Diagnosis Date Noted   Biceps tendonitis on left 05/11/2022   OA (osteoarthritis) of shoulder 05/06/2022   S/P reverse total shoulder arthroplasty, 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   S/P shoulder replacement, right 07/26/2019   S/P knee replacement 02/16/2015   Osteoarthritis of left knee 02/16/2015   OBESITY 10/25/2007   HIATAL HERNIA 10/25/2007   ABDOMINAL PAIN-EPIGASTRIC 10/25/2007    PCP: Arthur Holms, NP   REFERRING PROVIDER: Donella Stade, PA-C   REFERRING DIAG:   THERAPY DIAG:  Acute pain of left shoulder  Muscle weakness (generalized)  Localized edema  Rationale for Evaluation and Treatment: Rehabilitation  ONSET DATE: Lt rTSA 05/06/22  SUBJECTIVE:  SUBJECTIVE STATEMENT: She says she is flared up all over her body today because of the rain PERTINENT HISTORY: PMH: Lt rTSA 05/06/22, Rt TSA 2021, Lt TKA 2016  PAIN:  Are you having pain? Yes: NPRS scale: 9/10 Pain location: anterior left shoulder Pain description: dull pain Aggravating factors: pain comes and goes Relieving factors: not moving it, rest   PRECAUTIONS: Shoulder, limit ER to 30 deg to protect subscap repair until 06/18/22  WEIGHT BEARING RESTRICTIONS: No  FALLS:  Has patient fallen in last 6 months? No  OCCUPATION: none   PLOF: Independent  PATIENT GOALS: reduce pain and get her left shoulder better  NEXT MD VISIT:   OBJECTIVE:   DIAGNOSTIC FINDINGS:     PATIENT SURVEYS:  Eval: FOTO 45% functional, goal 59%  COGNITION: Overall cognitive status: Within functional limits for tasks assessed     SENSATION:   POSTURE:   UPPER EXTREMITY ROM:   Active ROM/PROM Right eval Left eval Left 07/02/22  Shoulder flexion  110/130 130/  Shoulder extension     Shoulder abduction  110/130 120  Shoulder adduction     Shoulder internal rotation functional behind back   Left iliac crest/40 Left SIJ/  Shoulder external rotation functional behind head  Occiput /30 (stopped at 30 due to post op precautions) C6/  Elbow flexion     Elbow extension     Wrist flexion     Wrist extension     Wrist ulnar deviation     Wrist radial deviation     Wrist pronation     Wrist supination     (Blank rows = not tested)  UPPER EXTREMITY MMT:  MMT Right eval Left Eval (Did not apply resistance due to post op status) Left 07/02/22  Shoulder flexion  3 4  Shoulder extension     Shoulder abduction  3 4  Shoulder adduction     Shoulder internal rotation  3 4  Shoulder external rotation  3 4  Middle trapezius     Lower trapezius     Elbow flexion     Elbow extension     Wrist flexion     Wrist extension     Wrist ulnar deviation     Wrist radial deviation     Wrist pronation     Wrist supination     Grip strength (lbs)     (Blank rows = not tested)  SHOULDER SPECIAL TESTS:   JOINT MOBILITY TESTING:    PALPATION:  Tender to palpation anterior left shoulder   TODAY'S TREATMENT:  07/09/22 Pulleys 2 min flexion, 2 min abd Bilat rows red 2X10 Bilat shoulder extensions red 2X10 Lt shoulder IR red 2X10 Bilat shoulder ER red 2X10 Bicep curls 2# X 15 bilat UBE X 5 min switch half way Shoulder AAROM flexion, abd, ER with 1# bar X 10 each  07/02/22 Moist heat to Rt shoulder at beginning X 7 min (not included in billing time) Updated measurements  Updated HEP Pulleys 2 min flexion, 2 min abd Bilat rows red 2X10 Bilat shoulder extensions  red 2X10 Lt shoulder IR red 2X10 Lt shoulder ER red 2X10 Shoulder AAROM flexion, abd, ER with 1# bar X 10 each    PATIENT EDUCATION: Education details: HEP, PT plan of care Person educated: Patient Education method: Explanation, Demonstration, Verbal cues, and Handouts Education comprehension: verbalized understanding and needs further education   HOME EXERCISE PROGRAM: Access Code: [email or web address] URL: [email or web address] Date:updated  07/02/2022 Prepared by: Elsie Ra  Exercises - Standing Shoulder Row with Anchored Resistance  - 2 x daily - 6 x weekly - 2 sets - 10 reps - Shoulder extension with resistance - Neutral  - 2 x daily - 6 x weekly - 2 sets - 10 reps - Shoulder External Rotation with Anchored Resistance  - 2 x daily - 6 x weekly - 2 sets - 10 reps - Shoulder Internal Rotation with Resistance  - 2 x daily - 6 x weekly - 2 sets - 10 reps - Seated Shoulder Flexion AAROM with Dowel  - 2 x daily - 6 x weekly - 1-2 sets - 10 reps - Seated Shoulder Abduction AAROM with Dowel  - 2 x daily - 6 x weekly - 1 sets - 10 reps - Seated Shoulder External Rotation AAROM with Dowel  - 2 x daily - 6 x weekly - 1 sets - 10 reps ASSESSMENT:  CLINICAL IMPRESSION: She was limited by pain all over today due to the rain she reports. I modified her shoulder exercises today to be performed in sitting and to better suit her pain tolerance today.    OBJECTIVE IMPAIRMENTS: decreased activity tolerance, decreased shoulder mobility, decreased ROM, decreased strength, impaired flexibility, impaired UE use, postural dysfunction, and pain.  ACTIVITY LIMITATIONS: reaching, lifting, carry,  cleaning, driving, sleeping  PERSONAL FACTORS: PMH: Lt rTSA 05/06/22, Rt TSA 2021, Lt TKA 2016 also affecting patient's functional outcome.  REHAB POTENTIAL: Good  CLINICAL DECISION MAKING: Stable/uncomplicated  EVALUATION COMPLEXITY: Low    GOALS: Short term PT Goals Target date:  07/07/2022    Pt will be I and compliant with HEP. Baseline:  Goal status: MET 07/02/22 Pt will decrease pain by 25% overall Baseline: Goal status: ongoing 06/3022  Long term PT goals Target date:08/04/2022   Pt will improve Rt shoulder AROM to [facility] to improve functional reaching Baseline: Goal status: ongoing 06/3022 Pt will improve  Rt shoulder strength to at least 4+/5 MMT to improve functional strength Baseline: Goal status: ongoing 06/3022 Pt will improve FOTO to at least 59% functional to show improved function Baseline: Goal status: ongoing 06/3022 Pt will reduce pain to overall less than 3/10 with usual activity and work activity. Baseline: Goal status: ongoing 06/3022  PLAN: PT FREQUENCY: 1-2 times per week   PT DURATION: 12 weeks  PLANNED INTERVENTIONS (unless contraindicated): aquatic PT, Canalith repositioning, cryotherapy, Electrical stimulation, Iontophoresis with 4 mg/ml dexamethasome, Moist heat, traction, Ultrasound, gait training, Therapeutic exercise, balance training, neuromuscular re-education, patient/family education, prosthetic training, manual techniques, passive ROM, dry needling, taping, vasopnuematic device, vestibular, spinal manipulations, joint manipulations  PLAN FOR NEXT SESSION: progress as tolerated   Elsie Ra, PT, DPT 07/09/22 3:20 PM

## 2022-07-16 ENCOUNTER — Ambulatory Visit: Payer: Medicare HMO | Admitting: Physical Therapy

## 2022-07-16 ENCOUNTER — Encounter: Payer: Self-pay | Admitting: Physical Therapy

## 2022-07-16 DIAGNOSIS — M25512 Pain in left shoulder: Secondary | ICD-10-CM

## 2022-07-16 DIAGNOSIS — M6281 Muscle weakness (generalized): Secondary | ICD-10-CM

## 2022-07-16 DIAGNOSIS — R600 Localized edema: Secondary | ICD-10-CM | POA: Diagnosis not present

## 2022-07-16 NOTE — Therapy (Signed)
OUTPATIENT PHYSICAL THERAPY SHOULDER TREATMENT   Patient Name: [name] MRN: [id number] DOB:1945-09-30, 77 y.o., female Today's Date: 07/16/2022  END OF SESSION:  PT End of Session - 07/16/22 1521     Visit Number 5    Number of Visits 15    Date for PT Re-Evaluation 08/04/22    Authorization Type devoted health    PT Start Time 1430    PT Stop Time 1500    PT Time Calculation (min) 30 min    Activity Tolerance Patient tolerated treatment well    Behavior During Therapy WFL for tasks assessed/performed                Past Medical History:  Diagnosis Date   Anxiety    Arthritis    osteoarthritis left knee, right shoulder "locks" occ.   Diabetes mellitus without complication    H/O seasonal allergies    History of hiatal hernia    Hyperlipidemia    Hypertension    Lipoma of buttock    Pneumonia    as a child   Sleep apnea    no cpap   Past Surgical History:  Procedure Laterality Date   ABDOMINAL HYSTERECTOMY     ovaries still intact   BACK SURGERY     lumbar fusion   CARPAL TUNNEL RELEASE Right    CATARACT EXTRACTION, BILATERAL     CHOLECYSTECTOMY     COLONOSCOPY WITH PROPOFOL N/A 07/30/2017   Procedure: COLONOSCOPY WITH PROPOFOL;  Surgeon: Juanita Craver, MD;  Location: [facility];  Service: Endoscopy;  Laterality: N/A;   ELBOW SURGERY Right    EXPLORATORY LAPAROTOMY     ovaries freed from intestines.   FOOT SURGERY     retained hardware.   KNEE ARTHROSCOPY Right    scope surgery   LIPOMA EXCISION Left 11/19/2021   Procedure: EXCISION OF LEFT UPPER BUTTOCK LIPOMA;  Surgeon: Greer Pickerel, MD;  Location: [facility];  Service: General;  Laterality: Left;   REVERSE SHOULDER ARTHROPLASTY Left 05/06/2022   Procedure: LEFT REVERSE SHOULDER ARTHROPLASTY;  Surgeon: Meredith Pel, MD;  Location: [facility];  Service: Orthopedics;  Laterality: Left;   TONSILLECTOMY     TOTAL KNEE ARTHROPLASTY Left 02/16/2015   Procedure: LEFT TOTAL KNEE  ARTHROPLASTY;  Surgeon: Sydnee Cabal, MD;  Location: [facility];  Service: Orthopedics;  Laterality: Left;   TOTAL SHOULDER ARTHROPLASTY Right 07/26/2019   Procedure: RIGHT TOTAL SHOULDER ARTHROPLASTY;  Surgeon: Meredith Pel, MD;  Location: [facility];  Service: Orthopedics;  Laterality: Right;   WRIST SURGERY Left    x2 "cysts"   Patient Active Problem List   Diagnosis Date Noted   Biceps tendonitis on left 05/11/2022   OA (osteoarthritis) of shoulder 05/06/2022   S/P reverse total shoulder arthroplasty, 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   S/P shoulder replacement, right 07/26/2019   S/P knee replacement 02/16/2015   Osteoarthritis of left knee 02/16/2015   OBESITY 10/25/2007   HIATAL HERNIA 10/25/2007   ABDOMINAL PAIN-EPIGASTRIC 10/25/2007    PCP: Arthur Holms, NP   REFERRING PROVIDER: Donella Stade, PA-C   REFERRING DIAG:   THERAPY DIAG:  Acute pain of left shoulder  Muscle weakness (generalized)  Localized edema  Rationale for Evaluation and Treatment: Rehabilitation  ONSET DATE: Lt rTSA 05/06/22  SUBJECTIVE:  SUBJECTIVE STATEMENT: She says she is still flared up because of the rain, wants to know if she can use gym equipment because she has one where she lives now PERTINENT HISTORY: PMH: Lt rTSA 05/06/22, Rt TSA 2021, Lt TKA 2016  PAIN:  Are you having pain? Yes: NPRS scale: does not give # today/10 Pain location: anterior left shoulder Pain description: dull pain Aggravating factors: pain comes and goes Relieving factors: not moving it, rest   PRECAUTIONS: Shoulder, limit ER to 30 deg to protect subscap repair until 06/18/22  WEIGHT BEARING RESTRICTIONS: No  FALLS:  Has patient fallen in last 6 months? No  OCCUPATION: none   PLOF: Independent  PATIENT GOALS: reduce pain and get her left  shoulder better  NEXT MD VISIT:   OBJECTIVE:   DIAGNOSTIC FINDINGS:    PATIENT SURVEYS:  Eval: FOTO 45% functional, goal 59%  COGNITION: Overall cognitive status: Within functional limits for tasks assessed     SENSATION:   POSTURE:   UPPER EXTREMITY ROM:   Active ROM/PROM Right eval Left eval Left 07/02/22  Shoulder flexion  110/130 130/  Shoulder extension     Shoulder abduction  110/130 120  Shoulder adduction     Shoulder internal rotation functional behind back   Left iliac crest/40 Left SIJ/  Shoulder external rotation functional behind head  Occiput /30 (stopped at 30 due to post op precautions) C6/  Elbow flexion     Elbow extension     Wrist flexion     Wrist extension     Wrist ulnar deviation     Wrist radial deviation     Wrist pronation     Wrist supination     (Blank rows = not tested)  UPPER EXTREMITY MMT:  MMT Right eval Left Eval (Did not apply resistance due to post op status) Left 07/02/22  Shoulder flexion  3 4  Shoulder extension     Shoulder abduction  3 4  Shoulder adduction     Shoulder internal rotation  3 4  Shoulder external rotation  3 4  Middle trapezius     Lower trapezius     Elbow flexion     Elbow extension     Wrist flexion     Wrist extension     Wrist ulnar deviation     Wrist radial deviation     Wrist pronation     Wrist supination     Grip strength (lbs)     (Blank rows = not tested)  SHOULDER SPECIAL TESTS:   JOINT MOBILITY TESTING:    PALPATION:  Tender to palpation anterior left shoulder   TODAY'S TREATMENT:  07/16/22 Pulleys 2 min flexion, 2 min abd Row machine 15# 2X10 Lat pull machine 20# 2X10 Bilat shoulder extensions red 2X10 Lt shoulder IR red 2X10 Lt shoulder ER red 2X10 UBE X 6 min L3 switch half way Shoulder AAROM flexion, abd, with 2# bar X 10 each 90 deg 2# ball stability rolls X 10 up/down, lateral, circles  07/09/22 Pulleys 2 min flexion, 2 min abd Bilat rows red 2X10 Bilat  shoulder extensions red 2X10 Lt shoulder IR red 2X10 Bilat shoulder ER red 2X10 Bicep curls 2# X 15 bilat UBE X 5 min switch half way Shoulder AAROM flexion, abd, ER with 1# bar X 10 each  07/02/22 Moist heat to Rt shoulder at beginning X 7 min (not included in billing time) Updated measurements  Updated HEP Pulleys 2 min flexion, 2 min abd  Bilat rows red 2X10 Bilat shoulder extensions red 2X10 Lt shoulder IR red 2X10 Lt shoulder ER red 2X10 Shoulder AAROM flexion, abd, ER with 1# bar X 10 each    PATIENT EDUCATION: Education details: HEP, PT plan of care Person educated: Patient Education method: Explanation, Demonstration, Verbal cues, and Handouts Education comprehension: verbalized understanding and needs further education   HOME EXERCISE PROGRAM: Access Code: [email or web address] URL: [email or web address] Date:updated  07/02/2022 Prepared by: Elsie Ra  Exercises - Standing Shoulder Row with Anchored Resistance  - 2 x daily - 6 x weekly - 2 sets - 10 reps - Shoulder extension with resistance - Neutral  - 2 x daily - 6 x weekly - 2 sets - 10 reps - Shoulder External Rotation with Anchored Resistance  - 2 x daily - 6 x weekly - 2 sets - 10 reps - Shoulder Internal Rotation with Resistance  - 2 x daily - 6 x weekly - 2 sets - 10 reps - Seated Shoulder Flexion AAROM with Dowel  - 2 x daily - 6 x weekly - 1-2 sets - 10 reps - Seated Shoulder Abduction AAROM with Dowel  - 2 x daily - 6 x weekly - 1 sets - 10 reps - Seated Shoulder External Rotation AAROM with Dowel  - 2 x daily - 6 x weekly - 1 sets - 10 reps ASSESSMENT:  CLINICAL IMPRESSION: I showed her how to perform resistance machines today at light weight and she shows good understanding and tolerance to this. She is welcome to try this at her home gym as long as it does not cause pain.   OBJECTIVE IMPAIRMENTS: decreased activity tolerance, decreased shoulder mobility, decreased ROM, decreased strength, impaired  flexibility, impaired UE use, postural dysfunction, and pain.  ACTIVITY LIMITATIONS: reaching, lifting, carry,  cleaning, driving, sleeping  PERSONAL FACTORS: PMH: Lt rTSA 05/06/22, Rt TSA 2021, Lt TKA 2016 also affecting patient's functional outcome.  REHAB POTENTIAL: Good  CLINICAL DECISION MAKING: Stable/uncomplicated  EVALUATION COMPLEXITY: Low    GOALS: Short term PT Goals Target date: 07/07/2022    Pt will be I and compliant with HEP. Baseline:  Goal status: MET 07/02/22 Pt will decrease pain by 25% overall Baseline: Goal status: ongoing 06/3022  Long term PT goals Target date:08/04/2022   Pt will improve Rt shoulder AROM to [facility] to improve functional reaching Baseline: Goal status: ongoing 06/3022 Pt will improve  Rt shoulder strength to at least 4+/5 MMT to improve functional strength Baseline: Goal status: ongoing 06/3022 Pt will improve FOTO to at least 59% functional to show improved function Baseline: Goal status: ongoing 06/3022 Pt will reduce pain to overall less than 3/10 with usual activity and work activity. Baseline: Goal status: ongoing 06/3022  PLAN: PT FREQUENCY: 1-2 times per week   PT DURATION: 12 weeks  PLANNED INTERVENTIONS (unless contraindicated): aquatic PT, Canalith repositioning, cryotherapy, Electrical stimulation, Iontophoresis with 4 mg/ml dexamethasome, Moist heat, traction, Ultrasound, gait training, Therapeutic exercise, balance training, neuromuscular re-education, patient/family education, prosthetic training, manual techniques, passive ROM, dry needling, taping, vasopnuematic device, vestibular, spinal manipulations, joint manipulations  PLAN FOR NEXT SESSION: progress as tolerated   Elsie Ra, PT, DPT 07/16/22 3:22 PM

## 2022-07-23 ENCOUNTER — Encounter: Payer: No Typology Code available for payment source | Admitting: Physical Therapy

## 2022-07-28 ENCOUNTER — Other Ambulatory Visit (INDEPENDENT_AMBULATORY_CARE_PROVIDER_SITE_OTHER): Payer: Medicare HMO

## 2022-07-28 ENCOUNTER — Ambulatory Visit (INDEPENDENT_AMBULATORY_CARE_PROVIDER_SITE_OTHER): Payer: No Typology Code available for payment source | Admitting: Orthopedic Surgery

## 2022-07-28 ENCOUNTER — Encounter: Payer: Self-pay | Admitting: Orthopedic Surgery

## 2022-07-28 DIAGNOSIS — M545 Low back pain, unspecified: Secondary | ICD-10-CM

## 2022-07-28 DIAGNOSIS — M25561 Pain in right knee: Secondary | ICD-10-CM | POA: Diagnosis not present

## 2022-07-28 DIAGNOSIS — M25562 Pain in left knee: Secondary | ICD-10-CM

## 2022-07-28 NOTE — Progress Notes (Signed)
Office Visit Note   Patient: [name]           Date of Birth: April 10, 1946           MRN: [id number] Visit Date: 07/28/2022 Requested by: Loura Back, NP [address] [place],  [place] [address] PCP: Loura Back, NP  Subjective: Chief Complaint  Patient presents with   Left Shoulder - Routine Post Op   Lower Back - Pain    HPI: [name] is a 77 y.o. female who presents to the office reporting left shoulder pain and low back pain and knee pain.  Patient is doing reasonly well following left reverse shoulder replacement 05/06/2022.  She is in physical therapy.  She describes forward flexion and abduction both above 90 degrees.  Patient also reports severe low back pain today.  She does see spine and scoliosis for that problem and is potentially scheduled for some type of ablation surgery in the near future.  Reports primarily low back pain with some right-sided symptoms but no real radicular symptoms below the knees.  Patient also reports bilateral knee pain left worse than right.  She underwent left total knee replacement years ago but has been told that there is a problem with the patella.  Right knee also has severe arthritis but she states her left knee hurts more than the right knee..                ROS: All systems reviewed are negative as they relate to the chief complaint within the history of present illness.  Patient denies fevers or chills.  Assessment & Plan: Visit Diagnoses:  1. Low back pain, unspecified back pain laterality, unspecified chronicity, unspecified whether sciatica present   2. Pain in both knees, unspecified chronicity     Plan: Impression is well-functioning left reverse shoulder replacement.  Plan is to continue therapy for functional improvement in range of motion.  Low back pain is not entirely unexpected but surprisingly she does not have much in terms of superior adjacent segment disease.  Follow-up with spine and scoliosis for their  planned ablation procedure.  Regarding both knees I think she has severe arthritis in the right knee but may be over the limit for elective knee replacement in terms of BMI.  However the left patella femoral articulation may have an issue.  Plan CT scan to evaluate patella loose body as well as since reality of the implant on the patella.  She has enough patella thickness left I think revision could be considered.  No evidence of infection in that left knee but I would likely aspirate the knee prior to any type of revision surgery.  Follow-Up Instructions: No follow-ups on file.   Orders:  Orders Placed This Encounter  Procedures   XR Lumbar Spine 2-3 Views   XR KNEE 3 VIEW RIGHT   XR Knee 1-2 Views Left   CT KNEE LEFT WO CONTRAST   No orders of the defined types were placed in this encounter.     Procedures: No procedures performed   Clinical Data: No additional findings.  Objective: Vital Signs: There were no vitals taken for this visit.  Physical Exam:  Constitutional: Patient appears well-developed HEENT:  Head: Normocephalic Eyes:EOM are normal Neck: Normal range of motion Cardiovascular: Normal rate Pulmonary/chest: Effort normal Neurologic: Patient is alert Skin: Skin is warm Psychiatric: Patient has normal mood and affect  Ortho Exam: Ortho exam demonstrates slight valgus alignment right lower extremity.  She  has range of motion of about 5-100 on that right knee.  Left knee has range of motion of that 0-90.  There is an effusion present but no warmth.  Patella articulates without apprehension.  No focal joint line tenderness.  Pedal pulses palpable.  Patient has range of motion of that left shoulder 30/95/150.  Active forward flexion and abduction above 90 degrees.  Incision intact.  No nerve root tension signs bilateral lower extremities.  No definite paresthesias L1 S1 bilaterally.  No muscle wasting or atrophy in the lower extremities.  Reflexes  symmetric.  Specialty Comments:  No specialty comments available.  Imaging: XR Knee 1-2 Views Left  Result Date: 07/28/2022 AP lateral merchant radiographs left knee reviewed.  Cemented total knee prosthesis in good position and alignment on the femoral and tibial components.  No acute fracture.  Patella appears to have some type of bony loose body seen on the merchant view which is not seen on the AP or lateral view.  The patella also appears to be slightly offset medially allowing some of the exposed bone on the patella to articulate with the lateral femoral condyle.  XR KNEE 3 VIEW RIGHT  Result Date: 07/28/2022 AP lateral merchant radiographs right knee reviewed.  Valgus alignment is present.  Severe end-stage tricompartmental arthritis is present.  No acute fracture.  Bone-on-bone changes present in all 3 compartments.  XR Lumbar Spine 2-3 Views  Result Date: 07/28/2022 AP lateral radiographs lumbar spine reviewed.  Two-level fusion L4-5 L5-S1 in good position alignment with surprisingly minimal adjacent segment disease at L3-4.  No evidence of hardware complication.    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   Diabetes mellitus without complication    H/O seasonal allergies    History of hiatal hernia    Hyperlipidemia    Hypertension    Lipoma of buttock    Pneumonia    as a child   Sleep apnea    no cpap    Family History  Family history unknown: Yes    Past Surgical  History:  Procedure Laterality Date   ABDOMINAL HYSTERECTOMY     ovaries still intact   BACK SURGERY     lumbar fusion   CARPAL TUNNEL RELEASE Right    CATARACT EXTRACTION, BILATERAL     CHOLECYSTECTOMY     COLONOSCOPY WITH PROPOFOL N/A 07/30/2017   Procedure: COLONOSCOPY WITH PROPOFOL;  Surgeon: Charna Elizabeth, MD;  Location: [facility];  Service: Endoscopy;  Laterality: N/A;   ELBOW SURGERY Right    EXPLORATORY LAPAROTOMY     ovaries freed from intestines.   FOOT SURGERY     retained hardware.   KNEE ARTHROSCOPY Right    scope surgery   LIPOMA EXCISION Left 11/19/2021   Procedure: EXCISION OF LEFT UPPER BUTTOCK LIPOMA;  Surgeon: Gaynelle Adu, MD;  Location: [facility];  Service: General;  Laterality: Left;   REVERSE SHOULDER ARTHROPLASTY Left 05/06/2022   Procedure: LEFT REVERSE SHOULDER ARTHROPLASTY;  Surgeon: August Saucer,  Corrie Mckusick, MD;  Location: [facility] OR;  Service: Orthopedics;  Laterality: Left;   TONSILLECTOMY     TOTAL KNEE ARTHROPLASTY Left 02/16/2015   Procedure: LEFT TOTAL KNEE ARTHROPLASTY;  Surgeon: Eugenia Mcalpine, MD;  Location: [facility];  Service: Orthopedics;  Laterality: Left;   TOTAL SHOULDER ARTHROPLASTY Right 07/26/2019   Procedure: RIGHT TOTAL SHOULDER ARTHROPLASTY;  Surgeon: Cammy Copa, MD;  Location: [facility] OR;  Service: Orthopedics;  Laterality: Right;   WRIST SURGERY Left    x2 "cysts"   Social History   Occupational History   Not on file  Tobacco Use   Smoking status: Former    Packs/day: 0.50    Years: 10.00    Additional pack years: 0.00    Total pack years: 5.00    Types: Cigarettes    Quit date: 02/07/1973    Years since quitting: 49.5   Smokeless tobacco: Never  Vaping Use   Vaping Use: Never used  Substance and Sexual Activity   Alcohol use: Not Currently   Drug use: No   Sexual activity: Not Currently    Birth control/protection: Surgical    Comment: Hyst

## 2022-07-30 ENCOUNTER — Encounter: Payer: Self-pay | Admitting: Physical Therapy

## 2022-07-30 ENCOUNTER — Ambulatory Visit: Payer: Medicare HMO | Admitting: Physical Therapy

## 2022-07-30 DIAGNOSIS — M25512 Pain in left shoulder: Secondary | ICD-10-CM

## 2022-07-30 DIAGNOSIS — M6281 Muscle weakness (generalized): Secondary | ICD-10-CM

## 2022-07-30 DIAGNOSIS — R600 Localized edema: Secondary | ICD-10-CM

## 2022-07-30 NOTE — Therapy (Signed)
OUTPATIENT PHYSICAL THERAPY SHOULDER TREATMENT PHYSICAL THERAPY DISCHARGE SUMMARY  Visits from Start of Care: 6  Current functional level related to goals / functional outcomes: See below   Remaining deficits: See below   Education / Equipment: HEP  Plan:  Patient goals were met. Patient is being discharged due to meeting the PT rehab goals      Patient Name: [name] MRN: [id number] DOB:[birth year], 77 y.o., female Today's Date: 07/30/2022  END OF SESSION:  PT End of Session - 07/30/22 1259     Visit Number 6    Number of Visits 15    Date for PT Re-Evaluation 08/04/22    Authorization Type [employer]    PT Start Time 1300    PT Stop Time 1330    PT Time Calculation (min) 30 min    Activity Tolerance Patient tolerated treatment well    Behavior During Therapy WFL for tasks assessed/performed                Past Medical History:  Diagnosis Date   Anxiety    Arthritis    osteoarthritis left knee, right shoulder "locks" occ.   Diabetes mellitus without complication    H/O seasonal allergies    History of hiatal hernia    Hyperlipidemia    Hypertension    Lipoma of buttock    Pneumonia    as a child   Sleep apnea    no cpap   Past Surgical History:  Procedure Laterality Date   ABDOMINAL HYSTERECTOMY     ovaries still intact   BACK SURGERY     lumbar fusion   CARPAL TUNNEL RELEASE Right    CATARACT EXTRACTION, BILATERAL     CHOLECYSTECTOMY     COLONOSCOPY WITH PROPOFOL N/A 07/30/2017   Procedure: COLONOSCOPY WITH PROPOFOL;  Surgeon: Charna Elizabeth, MD;  Location: [facility];  Service: Endoscopy;  Laterality: N/A;   ELBOW SURGERY Right    EXPLORATORY LAPAROTOMY     ovaries freed from intestines.   FOOT SURGERY     retained hardware.   KNEE ARTHROSCOPY Right    scope surgery   LIPOMA EXCISION Left 11/19/2021   Procedure: EXCISION OF LEFT UPPER BUTTOCK LIPOMA;  Surgeon: Gaynelle Adu, MD;  Location: [facility];  Service:  General;  Laterality: Left;   REVERSE SHOULDER ARTHROPLASTY Left 05/06/2022   Procedure: LEFT REVERSE SHOULDER ARTHROPLASTY;  Surgeon: Cammy Copa, MD;  Location: [facility] OR;  Service: Orthopedics;  Laterality: Left;   TONSILLECTOMY     TOTAL KNEE ARTHROPLASTY Left 02/16/2015   Procedure: LEFT TOTAL KNEE ARTHROPLASTY;  Surgeon: Eugenia Mcalpine, MD;  Location: [facility];  Service: Orthopedics;  Laterality: Left;   TOTAL SHOULDER ARTHROPLASTY Right 07/26/2019   Procedure: RIGHT TOTAL SHOULDER ARTHROPLASTY;  Surgeon: Cammy Copa, MD;  Location: [facility] OR;  Service: Orthopedics;  Laterality: Right;   WRIST SURGERY Left    x2 "cysts"   Patient Active Problem List   Diagnosis Date Noted   Biceps tendonitis on left 05/11/2022   OA (osteoarthritis) of shoulder 05/06/2022   S/P reverse total shoulder arthroplasty, 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   S/P shoulder replacement, right 07/26/2019   S/P knee replacement 02/16/2015   Osteoarthritis of left knee 02/16/2015   OBESITY 10/25/2007   HIATAL HERNIA 10/25/2007   ABDOMINAL PAIN-EPIGASTRIC 10/25/2007    PCP: Loura Back, NP   REFERRING PROVIDER: Julieanne Cotton, PA-C   REFERRING DIAG:   THERAPY DIAG:  Acute pain of left shoulder  Muscle weakness (generalized)  Localized edema  Rationale for Evaluation and Treatment: Rehabilitation  ONSET DATE: Lt rTSA 05/06/22  SUBJECTIVE:                                                                                                                                                                                      SUBJECTIVE STATEMENT: She is doing well with her shoulder, does complain of back and knee pain and will need surgery in the future. She agrees to DC today.  PERTINENT HISTORY: PMH: Lt rTSA 05/06/22, Rt TSA 2021, Lt TKA 2016  PAIN:  Are you having pain? Yes: NPRS scale: does not give # today/10 Pain location: anterior left shoulder Pain description: dull pain Aggravating factors: pain comes  and goes Relieving factors: not moving it, rest   PRECAUTIONS: Shoulder, limit ER to 30 deg to protect subscap repair until 06/18/22  WEIGHT BEARING RESTRICTIONS: No  FALLS:  Has patient fallen in last 6 months? No  OCCUPATION: none   PLOF: Independent  PATIENT GOALS: reduce pain and get her left shoulder better  NEXT MD VISIT:   OBJECTIVE:   DIAGNOSTIC FINDINGS:    PATIENT SURVEYS:  Eval: FOTO 45% functional, goal 59% 07/30/22: FOTO 66% and met goal  COGNITION: Overall cognitive status: Within functional limits for tasks assessed     SENSATION:   POSTURE:   UPPER EXTREMITY ROM:   Active ROM/PROM Right eval Left eval Left 07/02/22 Left 07/30/22  Shoulder flexion  110/130 130/ A: 135  Shoulder extension      Shoulder abduction  110/130 120 A: 125  Shoulder adduction      Shoulder internal rotation functional behind back   Left iliac crest/40 Left SIJ/ L5  Shoulder external rotation functional behind head  Occiput /30 (stopped at 30 due to post op precautions) C6/   Elbow flexion      Elbow extension      Wrist flexion      Wrist extension      Wrist ulnar deviation      Wrist radial deviation      Wrist pronation      Wrist supination      (Blank rows = not tested)  UPPER EXTREMITY MMT:  MMT Right eval Left Eval (Did not apply resistance due to post op status) Left 07/02/22 Left 07/30/22  Shoulder flexion  3 4 4+  Shoulder extension      Shoulder abduction  3 4 4+  Shoulder adduction      Shoulder internal rotation  3 4 5   Shoulder external rotation  3 4 4+  Middle trapezius      Lower trapezius  Elbow flexion      Elbow extension      Wrist flexion      Wrist extension      Wrist ulnar deviation      Wrist radial deviation      Wrist pronation      Wrist supination      Grip strength (lbs)      (Blank rows = not tested)  SHOULDER SPECIAL TESTS:   JOINT MOBILITY TESTING:    PALPATION:  Tender to palpation anterior left  shoulder   TODAY'S TREATMENT:  07/30/22 Pulleys 2 min flexion, 2 min abd Updated measurements and goals. Rows blue X 20 Bilat shoulder extensions blue 2X10 Lt shoulder IR green 2X10 Lt shoulder ER green 2X10 UBE X 6 min L3 switch half way Shoulder flexion to 90 deg 2#, 2X10 bilat Shoulder abduction to 90 deg 2#, 2X10 bilats     PATIENT EDUCATION: Education details: HEP, PT plan of care Person educated: Patient Education method: Explanation, Demonstration, Verbal cues, and Handouts Education comprehension: verbalized understanding and needs further education   HOME EXERCISE PROGRAM: Access Code: [email or web address] URL: [email or web address] Date: 07/30/2022 Prepared by: Ivery Quale  Exercises - Standing Shoulder Row with Anchored Resistance  - 2 x daily - 6 x weekly - 2 sets - 10 reps - Shoulder extension with resistance - Neutral  - 2 x daily - 6 x weekly - 2 sets - 10 reps - Shoulder External Rotation with Anchored Resistance  - 2 x daily - 6 x weekly - 2 sets - 10 reps - Shoulder Internal Rotation with Resistance  - 2 x daily - 6 x weekly - 2 sets - 10 reps - Standing Shoulder Flexion to 90 Degrees with Dumbbells  - 2 x daily - 6 x weekly - 1-2 sets - 10 reps - Shoulder Abduction with Dumbbells - Palms Down  - 2 x daily - 6 x weekly - 1-2 sets - 10 reps - Seated Overhead Press with Bar  - 2 x daily - 6 x weekly - 1-2 sets - 10 reps  ASSESSMENT:  CLINICAL IMPRESSION: She has met her PT goals for her shoulder and is at a good functional level. She will be discharged today and will continue with HEP which was updated today to reflect her progress.   OBJECTIVE IMPAIRMENTS: decreased activity tolerance, decreased shoulder mobility, decreased ROM, decreased strength, impaired flexibility, impaired UE use, postural dysfunction, and pain.  ACTIVITY LIMITATIONS: reaching, lifting, carry,  cleaning, driving, sleeping  PERSONAL FACTORS: PMH: Lt rTSA 05/06/22, Rt TSA 2021, Lt  TKA 2016 also affecting patient's functional outcome.  REHAB POTENTIAL: Good  CLINICAL DECISION MAKING: Stable/uncomplicated  EVALUATION COMPLEXITY: Low    GOALS: Short term PT Goals Target date: 07/07/2022    Pt will be I and compliant with HEP. Baseline:  Goal status: MET 07/02/22 Pt will decrease pain by 25% overall Baseline: Goal status: MET for 07/30/22  Long term PT goals Target date:08/04/2022   Pt will improve Rt shoulder AROM to [facility] to improve functional reaching Baseline: Goal status: MET 07/30/22 Pt will improve  Rt shoulder strength to at least 4+/5 MMT to improve functional strength Baseline: Goal status: MET 07/30/22 Pt will improve FOTO to at least 59% functional to show improved function Baseline: Goal status:MET, improved to 66% on 07/30/22 Pt will reduce pain to overall less than 3/10 with usual activity and work activity. Baseline: Goal status: MET 07/30/22  PLAN: PT FREQUENCY: 1-2 times per week  PT DURATION: 12 weeks  PLANNED INTERVENTIONS (unless contraindicated): aquatic PT, Canalith repositioning, cryotherapy, Electrical stimulation, Iontophoresis with 4 mg/ml dexamethasome, Moist heat, traction, Ultrasound, gait training, Therapeutic exercise, balance training, neuromuscular re-education, patient/family education, prosthetic training, manual techniques, passive ROM, dry needling, taping, vasopnuematic device, vestibular, spinal manipulations, joint manipulations  PLAN FOR NEXT SESSION: DC today   Ivery Quale, PT, DPT 07/30/22 1:31 PM

## 2022-08-06 ENCOUNTER — Encounter: Payer: No Typology Code available for payment source | Admitting: Physical Therapy

## 2022-08-18 ENCOUNTER — Ambulatory Visit: Payer: Medicare HMO | Admitting: Orthopedic Surgery

## 2022-08-28 ENCOUNTER — Ambulatory Visit
Admission: RE | Admit: 2022-08-28 | Discharge: 2022-08-28 | Disposition: A | Discharge status: Home / Self Care | Payer: Medicare HMO | Source: Ambulatory Visit | Attending: Orthopedic Surgery | Admitting: Orthopedic Surgery

## 2022-08-28 DIAGNOSIS — M25562 Pain in left knee: Secondary | ICD-10-CM

## 2022-09-01 ENCOUNTER — Ambulatory Visit: Payer: Medicare HMO | Admitting: Orthopedic Surgery

## 2022-09-01 ENCOUNTER — Encounter: Payer: Self-pay | Admitting: Orthopedic Surgery

## 2022-09-01 DIAGNOSIS — M545 Low back pain, unspecified: Secondary | ICD-10-CM

## 2022-09-01 NOTE — Progress Notes (Signed)
Office Visit Note   Patient: [name]           Date of Birth: 1946/02/18           MRN: [id number] Visit Date: 09/01/2022 Requested by: Loura Back, NP [address] [place],  [place] [address] PCP: Loura Back, NP  Subjective: Chief Complaint  Patient presents with   Left Knee - Follow-up    CT review    HPI: [name] is a 77 y.o. female who presents to the office reporting left knee pain and leg pain.  Since she was last seen she had a CT scan.  Bone scan in 2021 was normal on the left-hand side.  Reports most of her pain on the lateral joint line as well as in the proximal tibial region.  Occasionally radiates to the top of her foot.  She is in pain management.  Appointment with her spine physician is pending.  She is having to take care of her mother who is in failing health..                ROS: All systems reviewed are negative as they relate to the chief complaint within the history of present illness.  Patient denies fevers or chills.  Assessment & Plan: Visit Diagnoses:  1. Low back pain, unspecified back pain laterality, unspecified chronicity, unspecified whether sciatica present     Plan: Impression is no definite actionable problem with the left knee.  No definite loosening and the loose body seen on the merchant view is actually proximal to the patella.  It appears to be embedded in the soft tissue just proximal to the native patella.  Does not appear to be a loose fragment floating around in the prepatellar space.  Plan at this time is observation.  No real actionable problem in that left knee.  Patient has right knee arthritis but BMI is too high for knee replacement.  She is good to work on that.  Follow-up as needed.  Follow-Up Instructions: No follow-ups on file.   Orders:  No orders of the defined types were placed in this encounter.  No orders of the defined types were placed in this encounter.     Procedures: No procedures  performed   Clinical Data: No additional findings.  Objective: Vital Signs: There were no vitals taken for this visit.  Physical Exam:  Constitutional: Patient appears well-developed HEENT:  Head: Normocephalic Eyes:EOM are normal Neck: Normal range of motion Cardiovascular: Normal rate Pulmonary/chest: Effort normal Neurologic: Patient is alert Skin: Skin is warm Psychiatric: Patient has normal mood and affect  Ortho Exam: Ortho exam demonstrates no warmth and no effusion in that left knee.  Circumference right above the patella is roughly equal on both sides.  Range of motion is excellent with intact extensor mechanism.  No groin pain on the left with internal or external rotation of the leg.  Specialty Comments:  No specialty comments available.  Imaging: No results found.   PMFS History: Patient Active Problem List   Diagnosis Date Noted   Biceps tendonitis on left 05/11/2022   OA (osteoarthritis) of shoulder 05/06/2022   S/P reverse total shoulder arthroplasty, 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   S/P shoulder replacement, right 07/26/2019   S/P knee replacement 02/16/2015   Osteoarthritis of left knee 02/16/2015   OBESITY 10/25/2007   HIATAL HERNIA 10/25/2007   ABDOMINAL PAIN-EPIGASTRIC 10/25/2007   Past Medical History:  Diagnosis Date   Anxiety    Arthritis  osteoarthritis left knee, right shoulder "locks" occ.   Diabetes mellitus without complication (HCC)    H/O seasonal allergies    History of hiatal hernia    Hyperlipidemia    Hypertension    Lipoma of buttock    Pneumonia    as a child   Sleep apnea    no cpap    Family History  Family history unknown: Yes    Past Surgical History:  Procedure Laterality Date   ABDOMINAL HYSTERECTOMY     ovaries still intact   BACK SURGERY     lumbar fusion   CARPAL TUNNEL RELEASE Right    CATARACT EXTRACTION, BILATERAL     CHOLECYSTECTOMY     COLONOSCOPY WITH PROPOFOL N/A 07/30/2017   Procedure: COLONOSCOPY WITH  PROPOFOL;  Surgeon: Charna Elizabeth, MD;  Location: [facility];  Service: Endoscopy;  Laterality: N/A;   ELBOW SURGERY Right    EXPLORATORY LAPAROTOMY     ovaries freed from intestines.   FOOT SURGERY     retained hardware.   KNEE ARTHROSCOPY Right    scope surgery   LIPOMA EXCISION Left 11/19/2021   Procedure: EXCISION OF LEFT UPPER BUTTOCK LIPOMA;  Surgeon: Gaynelle Adu, MD;  Location: [facility];  Service: General;  Laterality: Left;   REVERSE SHOULDER ARTHROPLASTY Left 05/06/2022   Procedure: LEFT REVERSE SHOULDER ARTHROPLASTY;  Surgeon: Cammy Copa, MD;  Location: [facility] OR;  Service: Orthopedics;  Laterality: Left;   TONSILLECTOMY     TOTAL KNEE ARTHROPLASTY Left 02/16/2015   Procedure: LEFT TOTAL KNEE ARTHROPLASTY;  Surgeon: Eugenia Mcalpine, MD;  Location: [facility];  Service: Orthopedics;  Laterality: Left;   TOTAL SHOULDER ARTHROPLASTY Right 07/26/2019   Procedure: RIGHT TOTAL SHOULDER ARTHROPLASTY;  Surgeon: Cammy Copa, MD;  Location: [facility] OR;  Service: Orthopedics;  Laterality: Right;   WRIST SURGERY Left    x2 "cysts"   Social History   Occupational History   Not on file  Tobacco Use   Smoking status: Former    Packs/day: 0.50    Years: 10.00    Additional pack years: 0.00    Total pack years: 5.00    Types: Cigarettes    Quit date: 02/07/1973    Years since quitting: 49.5   Smokeless tobacco: Never  Vaping Use   Vaping Use: Never used  Substance and Sexual Activity   Alcohol use: Not Currently   Drug use: No   Sexual activity: Not Currently    Birth control/protection: Surgical    Comment: Hyst

## 2023-05-22 ENCOUNTER — Other Ambulatory Visit: Payer: Self-pay | Admitting: Registered Nurse

## 2023-05-22 ENCOUNTER — Ambulatory Visit
Admission: RE | Admit: 2023-05-22 | Discharge: 2023-05-22 | Disposition: A | Discharge status: Home / Self Care | Payer: Medicare HMO | Source: Ambulatory Visit | Attending: Registered Nurse

## 2023-05-22 DIAGNOSIS — R079 Chest pain, unspecified: Secondary | ICD-10-CM

## 2023-06-03 ENCOUNTER — Other Ambulatory Visit (INDEPENDENT_AMBULATORY_CARE_PROVIDER_SITE_OTHER): Payer: Medicare HMO

## 2023-06-03 ENCOUNTER — Ambulatory Visit: Payer: Medicare HMO | Admitting: Orthopedic Surgery

## 2023-06-03 DIAGNOSIS — M79604 Pain in right leg: Secondary | ICD-10-CM

## 2023-06-03 NOTE — Progress Notes (Unsigned)
Office Visit Note   Patient: [name]           Date of Birth: [birth year]           MRN: [id number] Visit Date: 06/03/2023 Requested by: Loura Back, NP [address] [place],  [place] [address] PCP: Loura Back, NP  Subjective: Chief Complaint  Patient presents with   Right Leg - Pain    HPI: [name] is a 78 y.o. female who presents to the office reporting right hip knee and leg pain.  Pain has been going on for several weeks.  Denies any injury.  She does have a history of L4-5 and L5-S1 fusion.  That was done at [facility].  States she is having some hip flexor weakness affecting the right-hand side.  Reports pain going from the buttock to the lateral knee on the right-hand side.  She is on pain medication.  She did have MRI scan on the lumbar spine 1224 which looked like arachnoiditis.  Patient has had 1 epidural steroid injection.  Sounds like she also had ablation done.  Hard for her to do stairs.  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  Uses Tylenol and Mobic..                ROS: All systems reviewed are negative as they relate to the chief complaint within the history of present illness.  Patient denies fevers or chills.  Assessment & Plan: Visit Diagnoses:  1. Pain in right leg     Plan: Impression is hip flexor weakness with severe right knee arthritis.  She has had injections in the past which have not given her sustained relief.  I do not think an injection today would help it for more than a few days possibly a week based on the severity of the arthritis.  She is going to hold off on that intervention.  I think in general of the new finding for [name] is hip flexion weakness and she is unable to lift that right leg in order to get into the car and to get into and out of bed.  I think a repeat evaluation with her spine surgeons is indicated at this time to evaluate that hip flexor weakness.  No hip arthritis on plain radiographs.   She will follow-up with [place] as needed.  Regarding total knee replacement she would need to have BMI less than 40 in order to undergo that procedure.  Follow-Up Instructions: No follow-ups on file.   Orders:  Orders Placed This Encounter  Procedures   XR HIP UNILAT W OR W/O PELVIS 2-3 VIEWS RIGHT   XR KNEE 3 VIEW RIGHT   XR Lumbar Spine 2-3 Views   No orders of the defined types were placed in this encounter.     Procedures: No procedures performed   Clinical Data: No additional findings.  Objective: Vital Signs: There were no vitals taken for this visit.  Physical Exam:  Constitutional: Patient appears well-developed HEENT:  Head: Normocephalic Eyes:EOM are normal Neck: Normal range of motion Cardiovascular: Normal rate Pulmonary/chest: Effort normal Neurologic: Patient is alert Skin: Skin is warm Psychiatric: Patient has normal mood and affect  Ortho Exam: Ortho exam demonstrates pretty reasonable range of motion that right knee with some lateral joint line tenderness.  Pedal pulses palpable.  She does have 4-5 hip flexor strength on the right compared to the left.  5 out of 5 ankle dorsiflexion plantarflexion  quad and hamstring strength otherwise.  No definite nerve root tension signs bilaterally and no groin pain with internal/external rotation of either leg.  Specialty Comments:  No specialty comments available.  Imaging: No results found.   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   Diabetes mellitus without complication (HCC)    H/O seasonal allergies    History of hiatal hernia    Hyperlipidemia    Hypertension    Lipoma of buttock    Pneumonia    as a child   Sleep apnea    no cpap    Family History  Family history unknown: Yes    Past Surgical History:  Procedure Laterality Date   ABDOMINAL HYSTERECTOMY     ovaries still intact   BACK SURGERY     lumbar fusion   CARPAL TUNNEL RELEASE Right    CATARACT EXTRACTION, BILATERAL     CHOLECYSTECTOMY     COLONOSCOPY WITH PROPOFOL N/A 07/30/2017   Procedure: COLONOSCOPY WITH PROPOFOL;  Surgeon: Charna Elizabeth, MD;  Location: [facility];  Service: Endoscopy;  Laterality: N/A;   ELBOW SURGERY Right    EXPLORATORY LAPAROTOMY     ovaries freed from intestines.   FOOT SURGERY     retained hardware.   KNEE ARTHROSCOPY Right    scope surgery   LIPOMA EXCISION Left 11/19/2021   Procedure: EXCISION OF LEFT UPPER BUTTOCK LIPOMA;  Surgeon: Gaynelle Adu, MD;  Location: [facility];  Service: General;  Laterality: Left;   REVERSE SHOULDER ARTHROPLASTY Left 05/06/2022   Procedure: LEFT REVERSE SHOULDER ARTHROPLASTY;  Surgeon: Cammy Copa, MD;  Location: [facility] OR;  Service: Orthopedics;  Laterality: Left;   TONSILLECTOMY     TOTAL KNEE ARTHROPLASTY Left 02/16/2015   Procedure: LEFT TOTAL KNEE ARTHROPLASTY;  Surgeon: Eugenia Mcalpine, MD;  Location: [facility];  Service: Orthopedics;  Laterality: Left;   TOTAL SHOULDER ARTHROPLASTY Right 07/26/2019   Procedure: RIGHT TOTAL SHOULDER ARTHROPLASTY;  Surgeon: Cammy Copa, MD;  Location[facility] OR;  Service: Orthopedics;  Laterality: Right;   WRIST SURGERY Left    x2 "cysts"   Social History   Occupational History   Not on file  Tobacco Use   Smoking status: Former    Current packs/day: 0.00    Average packs/day: 0.5 packs/day for 10.0 years (5.0 ttl pk-yrs)    Types: Cigarettes    Start date: 02/08/1963    Quit date:  02/07/1973    Years since quitting: 50.3   Smokeless tobacco: Never  Vaping Use   Vaping status: Never Used  Substance and Sexual Activity   Alcohol use: Not Currently   Drug use: No   Sexual activity: Not Currently    Birth control/protection: Surgical    Comment: Hyst

## 2023-06-04 ENCOUNTER — Encounter: Payer: Self-pay | Admitting: Orthopedic Surgery

## 2023-08-05 ENCOUNTER — Other Ambulatory Visit: Payer: Self-pay | Admitting: Student

## 2023-08-05 DIAGNOSIS — I70212 Atherosclerosis of native arteries of extremities with intermittent claudication, left leg: Secondary | ICD-10-CM

## 2023-08-11 ENCOUNTER — Ambulatory Visit
Admission: RE | Admit: 2023-08-11 | Discharge: 2023-08-11 | Disposition: A | Discharge status: Home / Self Care | Source: Ambulatory Visit | Attending: Student | Admitting: Student

## 2023-08-11 DIAGNOSIS — I70212 Atherosclerosis of native arteries of extremities with intermittent claudication, left leg: Secondary | ICD-10-CM

## 2023-08-19 ENCOUNTER — Emergency Department (HOSPITAL_COMMUNITY)

## 2023-08-19 ENCOUNTER — Other Ambulatory Visit: Payer: Self-pay

## 2023-08-19 ENCOUNTER — Emergency Department (HOSPITAL_COMMUNITY)
Admission: EM | Admit: 2023-08-19 | Discharge: 2023-08-19 | Disposition: A | Discharge status: Home / Self Care | Attending: Emergency Medicine | Admitting: Emergency Medicine

## 2023-08-19 ENCOUNTER — Encounter (HOSPITAL_COMMUNITY): Payer: Self-pay | Admitting: Pharmacy Technician

## 2023-08-19 DIAGNOSIS — R112 Nausea with vomiting, unspecified: Secondary | ICD-10-CM | POA: Diagnosis not present

## 2023-08-19 DIAGNOSIS — I110 Hypertensive heart disease with heart failure: Secondary | ICD-10-CM | POA: Insufficient documentation

## 2023-08-19 DIAGNOSIS — Z7982 Long term (current) use of aspirin: Secondary | ICD-10-CM | POA: Diagnosis not present

## 2023-08-19 DIAGNOSIS — R1084 Generalized abdominal pain: Secondary | ICD-10-CM | POA: Insufficient documentation

## 2023-08-19 DIAGNOSIS — I509 Heart failure, unspecified: Secondary | ICD-10-CM | POA: Insufficient documentation

## 2023-08-19 DIAGNOSIS — R079 Chest pain, unspecified: Secondary | ICD-10-CM | POA: Diagnosis present

## 2023-08-19 DIAGNOSIS — R1110 Vomiting, unspecified: Secondary | ICD-10-CM

## 2023-08-19 DIAGNOSIS — R197 Diarrhea, unspecified: Secondary | ICD-10-CM | POA: Insufficient documentation

## 2023-08-19 LAB — URINALYSIS, ROUTINE W REFLEX MICROSCOPIC
Bacteria, UA: NONE SEEN
Bilirubin Urine: NEGATIVE
Glucose, UA: >=500 mg/dL — AB
Hgb urine dipstick: NEGATIVE
Ketones, ur: NEGATIVE mg/dL
Leukocytes,Ua: NEGATIVE
Nitrite: NEGATIVE
Protein, ur: NEGATIVE mg/dL
Specific Gravity, Urine: 1.025 (ref 1.005–1.030)
pH: 7 (ref 5.0–8.0)

## 2023-08-19 LAB — I-STAT CHEM 8, ED
BUN: 9 mg/dL (ref 8–23)
Calcium, Ion: 1.18 mmol/L (ref 1.15–1.40)
Chloride: 104 mmol/L (ref 98–111)
Creatinine, Ser: 0.8 mg/dL (ref 0.44–1.00)
Glucose, Bld: 89 mg/dL (ref 70–99)
HCT: 40 % (ref 36.0–46.0)
Hemoglobin: 13.6 g/dL (ref 12.0–15.0)
Potassium: 3.8 mmol/L (ref 3.5–5.1)
Sodium: 141 mmol/L (ref 135–145)
TCO2: 23 mmol/L (ref 22–32)

## 2023-08-19 LAB — COMPREHENSIVE METABOLIC PANEL WITH GFR
ALT: 11 U/L (ref 0–44)
AST: 12 U/L — ABNORMAL LOW (ref 15–41)
Albumin: 3.8 g/dL (ref 3.5–5.0)
Alkaline Phosphatase: 68 U/L (ref 38–126)
Anion gap: 8 (ref 5–15)
BUN: 8 mg/dL (ref 8–23)
CO2: 25 mmol/L (ref 22–32)
Calcium: 9.3 mg/dL (ref 8.9–10.3)
Chloride: 106 mmol/L (ref 98–111)
Creatinine, Ser: 0.79 mg/dL (ref 0.44–1.00)
GFR, Estimated: >60 mL/min (ref >60)
Glucose, Bld: 84 mg/dL (ref 70–99)
Potassium: 3.7 mmol/L (ref 3.5–5.1)
Sodium: 139 mmol/L (ref 135–145)
Total Bilirubin: 1 mg/dL (ref 0.0–1.2)
Total Protein: 6.7 g/dL (ref 6.5–8.1)

## 2023-08-19 LAB — CBC WITH DIFFERENTIAL/PLATELET
Abs Immature Granulocytes: 0.02 10*3/uL (ref 0.00–0.07)
Basophils Absolute: 0 10*3/uL (ref 0.0–0.1)
Basophils Relative: 0 %
Eosinophils Absolute: 0.1 10*3/uL (ref 0.0–0.5)
Eosinophils Relative: 2 %
HCT: 40.3 % (ref 36.0–46.0)
Hemoglobin: 13 g/dL (ref 12.0–15.0)
Immature Granulocytes: 0 %
Lymphocytes Relative: 20 %
Lymphs Abs: 1.7 10*3/uL (ref 0.7–4.0)
MCH: 27.6 pg (ref 26.0–34.0)
MCHC: 32.3 g/dL (ref 30.0–36.0)
MCV: 85.6 fL (ref 80.0–100.0)
Monocytes Absolute: 0.7 10*3/uL (ref 0.1–1.0)
Monocytes Relative: 8 %
Neutro Abs: 5.7 10*3/uL (ref 1.7–7.7)
Neutrophils Relative %: 70 %
Platelets: 301 10*3/uL (ref 150–400)
RBC: 4.71 MIL/uL (ref 3.87–5.11)
RDW: 14.6 % (ref 11.5–15.5)
WBC: 8.2 10*3/uL (ref 4.0–10.5)
nRBC: 0 % (ref 0.0–0.2)

## 2023-08-19 LAB — BRAIN NATRIURETIC PEPTIDE: B Natriuretic Peptide: 10.3 pg/mL (ref 0.0–100.0)

## 2023-08-19 LAB — TROPONIN I (HIGH SENSITIVITY)
Troponin I (High Sensitivity): 5 ng/L (ref <18)
Troponin I (High Sensitivity): 6 ng/L (ref <18)

## 2023-08-19 LAB — LIPASE, BLOOD: Lipase: 26 U/L (ref 11–51)

## 2023-08-19 MED ORDER — IOHEXOL 350 MG/ML SOLN
100.0000 mL | Freq: Once | INTRAVENOUS | Status: AC | PRN
  Administered 2023-08-19: 100 mL via INTRAVENOUS

## 2023-08-19 MED ORDER — ONDANSETRON 4 MG PO TBDP: 4.0000 mg | ORAL_TABLET | Freq: Three times a day (TID) | ORAL | 0 refills | Status: AC | PRN

## 2023-08-19 NOTE — ED Provider Notes (Signed)
 [name] Provider Note   CSN: [id number] Arrival date & time: 08/19/23  1215     History  No chief complaint on file.   [name] is a 78 y.o. female.  78 year old female presents today for complaint of chest pain which has been ongoing for the past couple weeks along with some shortness of breath with exertion.  She has history of CHF and is compliant with her diuretic.  Over the past couple days however she is complaining of some abdominal discomfort with nausea, vomiting, and diarrhea.  Currently denies any abdominal pain.  States at this point the diarrhea has become watery but reports about 3-4 episodes a day.  No recent antibiotic use.  The history is provided by the patient. No language interpreter was used.       Home Medications Prior to Admission medications   Medication Sig Start Date End Date Taking? Authorizing Provider  acetaminophen  (TYLENOL ) 500 MG tablet Take 2,000 mg by mouth daily as needed for moderate pain.    [provider]  aspirin  EC 81 MG tablet Take 81 mg by mouth daily. Swallow whole.    [provider]  buPROPion  ER (WELLBUTRIN  SR) 100 MG 12 hr tablet Take 100 mg by mouth every morning.    [provider]  Carboxymethylcellul-Glycerin (LUBRICATING EYE DROPS OP) Place 1 drop into both eyes daily as needed (dry eyes).    [provider]  diphenhydrAMINE  (BENADRYL ) 25 MG tablet Take 25 mg by mouth every 6 (six) hours as needed for allergies.    [provider]  docusate sodium  (COLACE) 100 MG capsule Take 1 capsule (100 mg total) by mouth 2 (two) times daily. 05/07/22   [name], [name], PA-C  glipiZIDE  (GLUCOTROL  XL) 10 MG 24 hr tablet Take 10 mg by mouth daily.    [provider]  HYDROcodone -acetaminophen  (NORCO/VICODIN) 5-325 MG tablet Take 1 tablet by mouth every 8 (eight) hours as needed for moderate pain. 06/16/22   Sue Mesi, MD   Insulin  Glargine (LANTUS SOLOSTAR) 100 UNIT/ML Solostar Pen Inject 40 Units into the skin at bedtime. 03/29/19   [provider]  lisinopril  (ZESTRIL ) 10 MG tablet Take 10 mg by mouth daily.    [provider]  meloxicam (MOBIC) 15 MG tablet Take 15 mg by mouth daily.    [provider]  methocarbamol  (ROBAXIN ) 500 MG tablet Take 1 tablet (500 mg total) by mouth every 8 (eight) hours as needed for muscle spasms. 05/07/22   [name], [name], PA-C  OVER THE COUNTER MEDICATION Take 1 capsule by mouth daily. CBD gummies    [provider]  oxyCODONE  (OXY IR/ROXICODONE ) 5 MG immediate release tablet Take 1 tablet (5 mg total) by mouth every 4 (four) hours as needed for moderate pain (pain score 4-6). 05/07/22   [name], [name], PA-C  rosuvastatin (CRESTOR) 10 MG tablet Take 10 mg by mouth daily.    [provider]      Allergies    Prednisone and Metformin    Review of Systems   Review of Systems  Constitutional:  Negative for chills and fever.  Respiratory:  Positive for shortness of breath.   Cardiovascular:  Positive for chest pain.  Gastrointestinal:  Positive for diarrhea, nausea and vomiting. Negative for abdominal pain.  All other systems reviewed and are negative.   Physical Exam Updated Vital Signs BP (!) 141/73 (BP Location: Right Arm)   Pulse  66   Temp 98.7 F (37.1 C)   Resp 17   SpO2 99%  Physical Exam Vitals and nursing note reviewed.  Constitutional:      General: She is not in acute distress.    Appearance: Normal appearance. She is not ill-appearing.  HENT:     Head: Normocephalic and atraumatic.     Nose: Nose normal.  Eyes:     Conjunctiva/sclera: Conjunctivae normal.  Cardiovascular:     Rate and Rhythm: Normal rate and regular rhythm.  Pulmonary:     Effort: Pulmonary effort is normal. No respiratory distress.     Breath sounds: Normal breath sounds. No wheezing or rales.  Abdominal:     General: There is  no distension.     Palpations: Abdomen is soft.     Tenderness: There is no abdominal tenderness. There is no guarding.  Musculoskeletal:        General: No deformity. Normal range of motion.     Cervical back: Normal range of motion.     Right lower leg: No edema.     Left lower leg: No edema.  Skin:    Findings: No rash.  Neurological:     Mental Status: She is alert.     ED Results / Procedures / Treatments   Labs (all labs ordered are listed, but only abnormal results are displayed) Labs Reviewed  COMPREHENSIVE METABOLIC PANEL WITH GFR - Abnormal; Notable for the following components:      Result Value   AST 12 (*)    All other components within normal limits  CBC WITH DIFFERENTIAL/PLATELET  LIPASE, BLOOD  BRAIN NATRIURETIC PEPTIDE  URINALYSIS, ROUTINE W REFLEX MICROSCOPIC  I-STAT CHEM 8, ED  TROPONIN I (HIGH SENSITIVITY)  TROPONIN I (HIGH SENSITIVITY)    EKG None  Radiology DG Chest 2 View Result Date: 08/19/2023 CLINICAL DATA:  Shortness of breath with exertion and intermittent chest pain x1 week. EXAM: CHEST - 2 VIEW COMPARISON:  May 22, 2023 FINDINGS: The heart size and mediastinal contours are within normal limits. There is tortuosity of the descending thoracic aorta. Low lung volumes are noted. Mild, stable atelectasis is seen within the left lung base. No pleural effusion or pneumothorax is identified. There is evidence of prior bilateral shoulder arthroplasty with multilevel degenerative changes seen throughout the thoracic spine. IMPRESSION: Low lung volumes with mild left basilar atelectasis. Electronically Signed   By: [name] M.D.   On: 08/19/2023 13:59    Procedures Procedures    Medications Ordered in ED Medications - No data to display  ED Course/ Medical Decision Making/ A&P                                 Medical Decision Making Amount and/or Complexity of Data Reviewed Radiology: ordered.   Medical Decision Making / ED  Course   This patient presents to the ED for concern of chest pain, vomiting, diarrhea, this involves an extensive number of treatment options, and is a complaint that carries with it a high risk of complications and morbidity.  The differential diagnosis includes viral URI, ACS, CHF exacerbation, colitis, diverticulitis, gastroenteritis  MDM: 78 year old female presents with abdominal pain.  Overall well-appearing.  Hemodynamically stable other than slight hypertension.  Admission considered but will reevaluate after labs and imaging.  CBC unremarkable, CMP without acute concern.  Initial troponin of 5.  Lipase of 26.  Low suspicion for ACS.  EKG without acute ischemic change.  Low heart score.  Chest x-ray without acute cardiopulmonary process.  No signs of volume overload on exam or on chest x-ray.  Pending CT scan to rule out acute intra-abdominal process.  Stool studies ordered.  If CT scan is reassuring patient is appropriate for discharge.  She can follow-up with her primary care doc regarding the diarrhea.  She can follow-up with her cardiologist and PCP regarding the chest pain.   Additional history obtained: -Additional history obtained from daughter at bedside -External records from outside source obtained and reviewed including: Chart review including previous notes, labs, imaging, consultation notes   Lab Tests: -I ordered, reviewed, and interpreted labs.   The pertinent results include:   Labs Reviewed  COMPREHENSIVE METABOLIC PANEL WITH GFR - Abnormal; Notable for the following components:      Result Value   AST 12 (*)    All other components within normal limits  CBC WITH DIFFERENTIAL/PLATELET  LIPASE, BLOOD  BRAIN NATRIURETIC PEPTIDE  URINALYSIS, ROUTINE W REFLEX MICROSCOPIC  I-STAT CHEM 8, ED  TROPONIN I (HIGH SENSITIVITY)  TROPONIN I (HIGH SENSITIVITY)      EKG  EKG Interpretation Date/Time:    Ventricular Rate:    PR Interval:    QRS Duration:     QT Interval:    QTC Calculation:   R Axis:      Text Interpretation:           Imaging Studies ordered: I ordered imaging studies including chest x-ray, CT abdomen pelvis with contrast I independently visualized and interpreted imaging. I agree with the radiologist interpretation   Medicines ordered and prescription drug management: No orders of the defined types were placed in this encounter.   -I have reviewed the patients home medicines and have made adjustments as needed   Reevaluation: After the interventions noted above, I reevaluated the patient and found that they have :improved  Co morbidities that complicate the patient evaluation  Past Medical History:  Diagnosis Date   Anxiety    Arthritis    osteoarthritis left knee, right shoulder "locks" occ.   Diabetes mellitus without complication (HCC)    H/O seasonal allergies    History of hiatal hernia    Hyperlipidemia    Hypertension    Lipoma of buttock    Pneumonia    as a child   Sleep apnea    no cpap      Dispostion: Signout to oncoming provider pending CT scan.   Final Clinical Impression(s) / ED Diagnoses Final diagnoses:  None    Rx / DC Orders ED Discharge Orders     None         Lucina Sabal, PA-C 08/19/23 1550    Sueellen Emery, MD 08/20/23 [phone]

## 2023-08-19 NOTE — ED Provider Triage Note (Signed)
 Emergency Medicine Provider Triage Evaluation Note  [name] , a 78 y.o. female  was evaluated in triage.  Pt complains of twinging chest pain for the past 2 to 3 weeks as well as shortness of breath worse with exertion as well as swelling that starts in her left ankle while she is sitting or driving and proceeds to move up the entire left side of her body only.  For the past week has had nausea, vomiting, diarrhea which prompted her to call EMS today to come to the ER. Also chest congestion with cough productive with yellow mucous.  Review of Systems  Positive:  Negative:   Physical Exam  BP (!) 141/73 (BP Location: Right Arm)   Pulse 66   Temp 98.7 F (37.1 C)   Resp 17   SpO2 99%  Gen:   Awake, no distress   Resp:  Normal effort  MSK:   Moves extremities without difficulty  Other:  No significant LE edema, lungs CTA  Medical Decision Making  Medically screening exam initiated at 12:28 PM.  Appropriate orders placed.  [name] was informed that the remainder of the evaluation will be completed by another provider, this initial triage assessment does not replace that evaluation, and the importance of remaining in the ED until their evaluation is complete.     Darlis Eisenmenger, PA-C 08/19/23 1230

## 2023-08-19 NOTE — ED Provider Notes (Signed)
  Physical Exam  BP 139/75   Pulse 71   Temp 98.2 F (36.8 C) (Oral)   Resp 18   SpO2 100%   Physical Exam  Procedures  Procedures  ED Course / MDM   Clinical Course as of 08/19/23 1827  Wed Aug 19, 2023  1826 Patient was a difficult IV start. Just now went for CT imaging.  [SU]    Clinical Course User Index [SU] Mandy Second, PA-C   Medical Decision Making Amount and/or Complexity of Data Reviewed Radiology: ordered.  Risk Prescription drug management.   CP, SOB, AP (diarrhea and vomiting) No volume overload  Pending CT abd/pel If negative, discharge home ?stool collection for GI testing  CT per radiology interpretation:  IMPRESSION: 1. No acute intrathoracic, abdominal, or pelvic pathology. 2. 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 These can be better evaluated with MRI on a nonemergent/outpatient basis. 3.  Aortic Atherosclerosis (ICD10-I70.0).  Patient has been comfortable, eating and drinking. No vomiting. No fever. Stable VS.   She is concerned about the extremity edema and is encouraged to continue care with her doctors. Patient reassured. She is stable for discharge.        Mandy Second, PA-C 08/19/23 2015    Sueellen Emery, MD 08/20/23 [phone]

## 2023-08-19 NOTE — Discharge Instructions (Signed)
 Follow up with your doctor for recheck this week or early next week. Zofran  for nausea if needed.   Return to the ED with any high fever, severe pain, new concern.

## 2023-08-19 NOTE — ED Triage Notes (Signed)
 Pt bib ems from home with complaints of abd pain NVD for approx 1 week. Some shob with exertion, intermittent chest pain. Some swelling to LLE when sitting for a long time.  164/92 HR 76 98%RA CBG 127

## 2023-09-15 ENCOUNTER — Encounter: Payer: Self-pay | Admitting: Registered Nurse

## 2023-11-09 ENCOUNTER — Other Ambulatory Visit: Payer: Self-pay | Admitting: Registered Nurse

## 2023-11-09 DIAGNOSIS — K869 Disease of pancreas, unspecified: Secondary | ICD-10-CM

## 2023-11-12 ENCOUNTER — Inpatient Hospital Stay
Admission: RE | Admit: 2023-11-12 | Discharge: 2023-11-12 | Discharge status: Home / Self Care | Source: Ambulatory Visit | Attending: Registered Nurse

## 2023-11-12 DIAGNOSIS — K869 Disease of pancreas, unspecified: Secondary | ICD-10-CM

## 2023-11-12 MED ORDER — IOPAMIDOL (ISOVUE-300) INJECTION 61%
125.0000 mL | Freq: Once | INTRAVENOUS | Status: AC | PRN
  Administered 2023-11-12: 125 mL via INTRAVENOUS

## 2024-02-15 ENCOUNTER — Encounter: Payer: Self-pay | Admitting: Radiology
